# Patient Record
Sex: Female | Born: 1970
Health system: Southern US, Community
[De-identification: ages and names within clinical notes are randomized; demographics above are authoritative.]

## PROBLEM LIST (undated history)

## (undated) DIAGNOSIS — E782 Mixed hyperlipidemia: Secondary | ICD-10-CM

## (undated) DIAGNOSIS — R519 Headache, unspecified: Secondary | ICD-10-CM

## (undated) DIAGNOSIS — F410 Panic disorder [episodic paroxysmal anxiety] without agoraphobia: Secondary | ICD-10-CM

## (undated) DIAGNOSIS — N809 Endometriosis, unspecified: Secondary | ICD-10-CM

## (undated) DIAGNOSIS — K219 Gastro-esophageal reflux disease without esophagitis: Secondary | ICD-10-CM

## (undated) DIAGNOSIS — J45909 Unspecified asthma, uncomplicated: Secondary | ICD-10-CM

## (undated) HISTORY — DX: Unspecified asthma, uncomplicated: J45.909

## (undated) HISTORY — PX: WISDOM TOOTH EXTRACTION: SHX21

## (undated) HISTORY — DX: Panic disorder (episodic paroxysmal anxiety): F41.0

## (undated) HISTORY — DX: Gastro-esophageal reflux disease without esophagitis: K21.9

## (undated) HISTORY — DX: Mixed hyperlipidemia: E78.2

## (undated) HISTORY — DX: Headache, unspecified: R51.9

## (undated) HISTORY — DX: Endometriosis, unspecified: N80.9

## (undated) HISTORY — PX: LAPAROSCOPY: SHX197

---

## 2013-01-22 ENCOUNTER — Ambulatory Visit
Admission: RE | Admit: 2013-01-22 | Discharge: 2013-01-22 | Disposition: A | Discharge status: Home / Self Care | Payer: BC Managed Care – PPO | Source: Ambulatory Visit | Attending: Family Medicine | Admitting: Family Medicine

## 2013-01-22 ENCOUNTER — Other Ambulatory Visit: Payer: Self-pay | Admitting: Family Medicine

## 2013-01-22 DIAGNOSIS — R52 Pain, unspecified: Secondary | ICD-10-CM

## 2014-04-12 IMAGING — CT CT ABD-PELV W/O CM
3 of 4 series · 9 of 46 positions shown, 16 images · non-contrast
Comparison: None.

CLINICAL DATA: Gross hematuria, lower abdominal pain, evaluate for
renal stones

EXAM:
CT ABDOMEN AND PELVIS WITHOUT CONTRAST
TECHNIQUE: Multidetector CT imaging of the abdomen and pelvis was performed
following the standard protocol without intravenous contrast.

[Series 3: lung windows · axial · 0.70mm/px · z∈[-48,+22]mm · 5 of 22 slices shown, 10 images]
[im 4/22  soft-tissue]
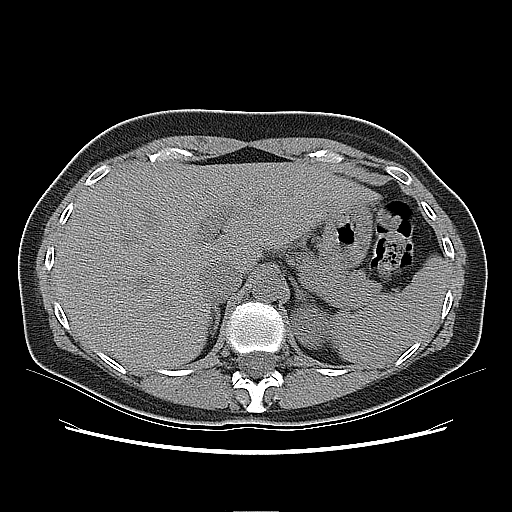
[im 4/22  bone]
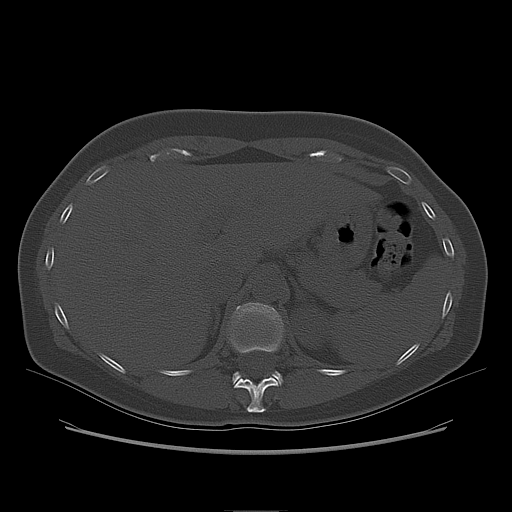
[im 8/22  soft-tissue]
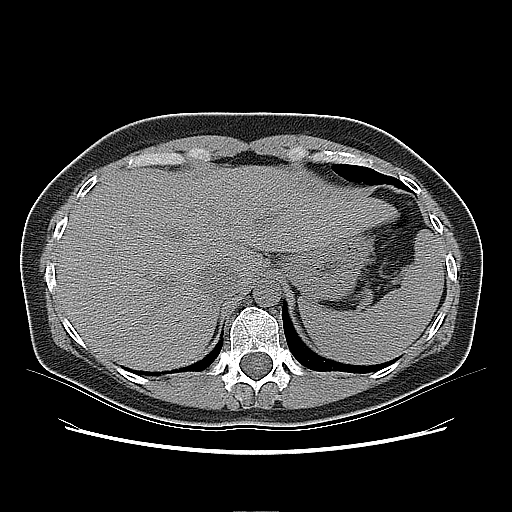
[im 8/22  lung]
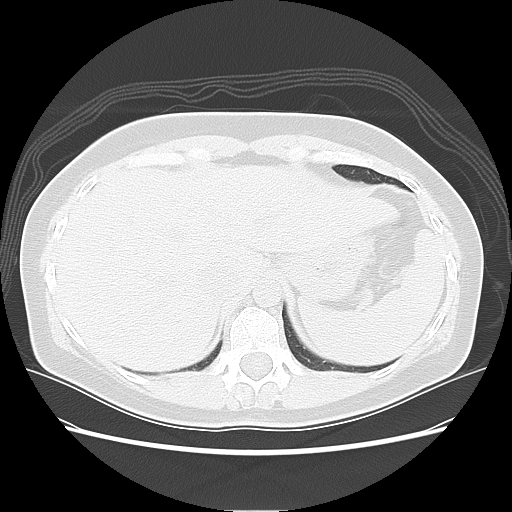
[im 11/22  soft-tissue]
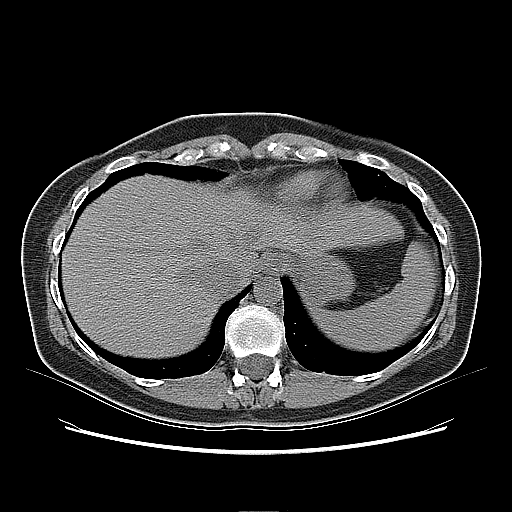
[im 11/22  lung]
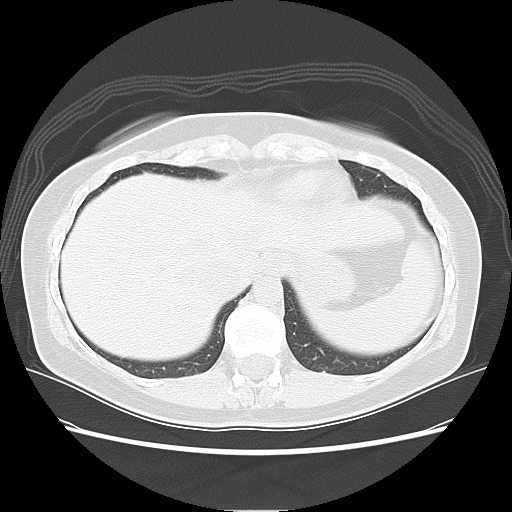
[im 15/22  soft-tissue]
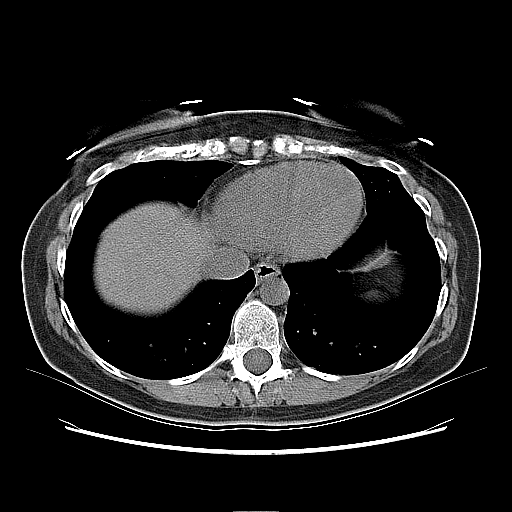
[im 15/22  lung]
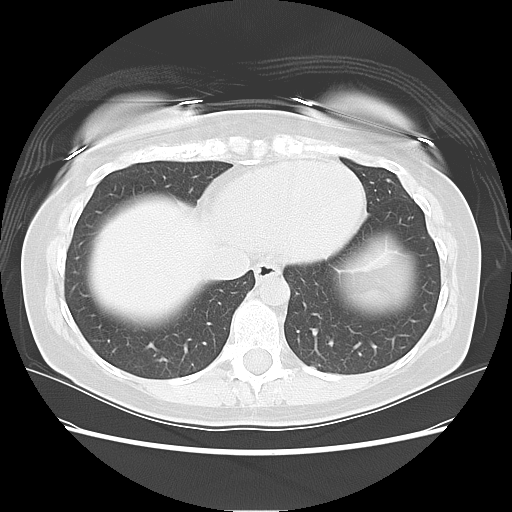
[im 18/22  soft-tissue]
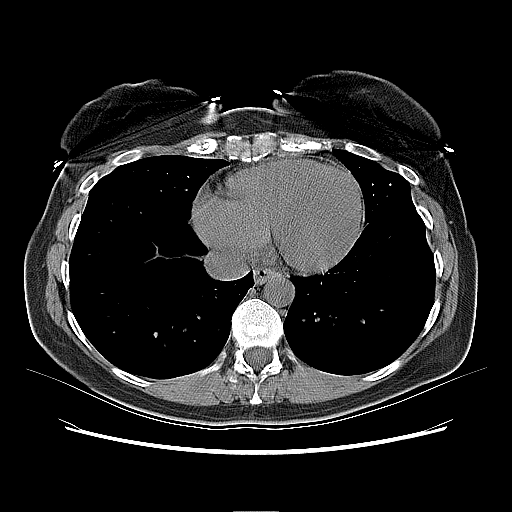
[im 18/22  lung]
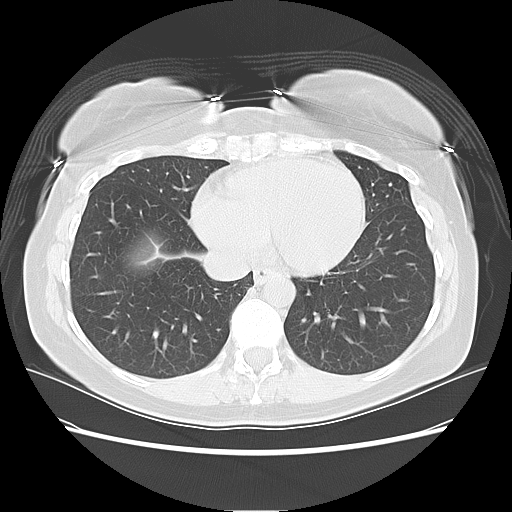

[Series 400: coronal · coronal · 0.93mm/px · 3 of 98 slices shown, 4 images]
[im 33/98  soft-tissue]
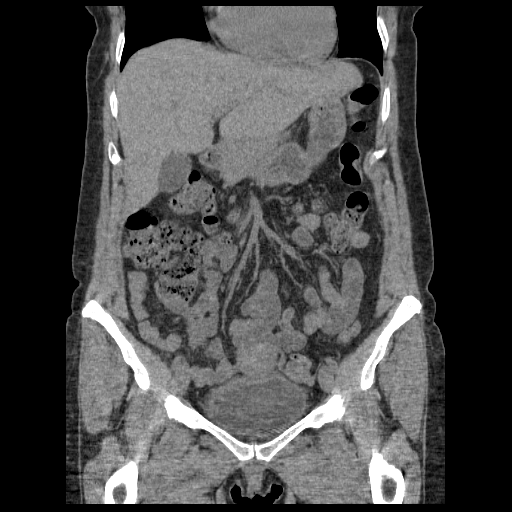
[im 44/98  soft-tissue]
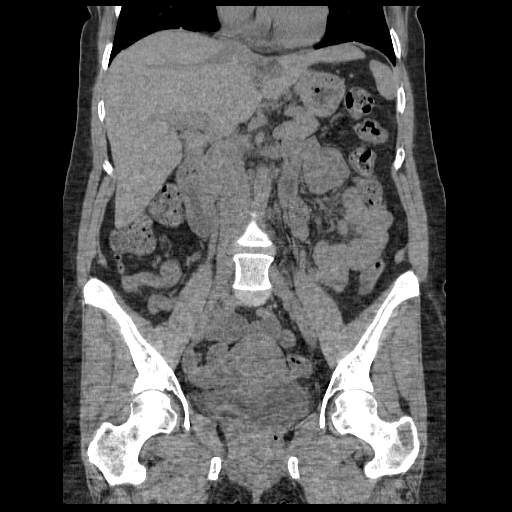
[im 44/98  bone]
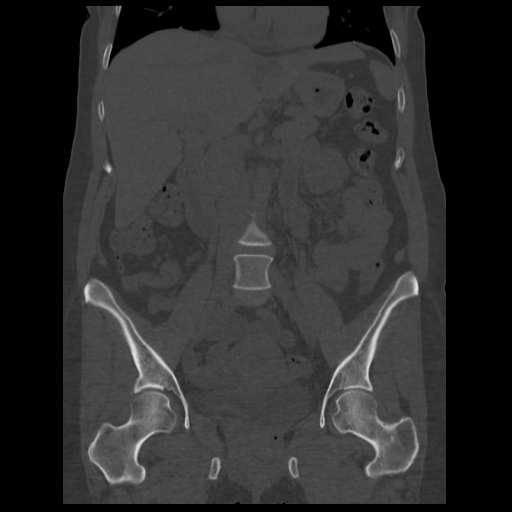
[im 54/98  soft-tissue]
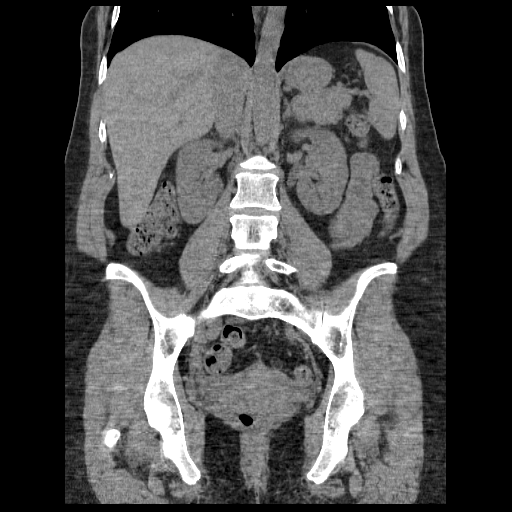

[Series 401: sagittal · sagittal · 0.93mm/px · 1 of 143 slices shown, 2 images]
[im 48/143  soft-tissue]
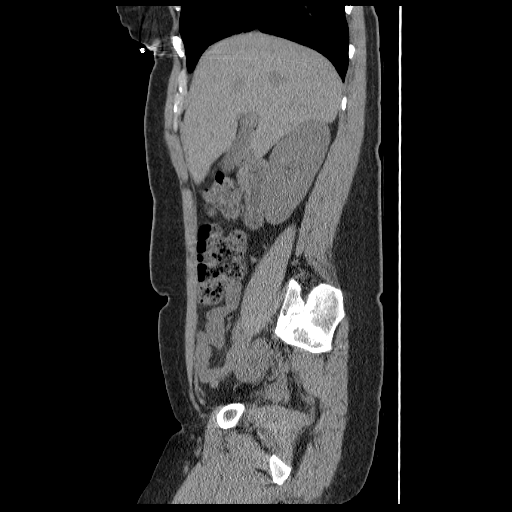
[im 48/143  bone]
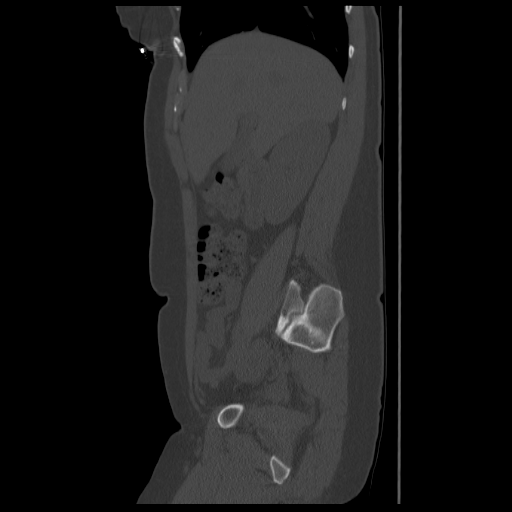

[9 of 46 positions shown; findings below may reference images not displayed]

FINDINGS: Lung bases are clear.

Unenhanced liver, spleen, pancreas, and adrenal glands are within
normal limits.

Gallbladder is unremarkable. No intrahepatic or extrahepatic ductal
dilatation.

Kidneys are within normal limits. No renal calculi or
hydronephrosis.

No evidence of bowel obstruction. Normal appendix.

No evidence of abdominal aortic aneurysm.

No abdominopelvic ascites.

No suspicious abdominopelvic lymphadenopathy.

Uterus and bilateral ovaries are unremarkable.

No ureteral or bladder calculi.

Bladder is notable for anterior wall thickening with perivesical
stranding (series 2/ image 75).

Calcified pelvic phleboliths.

Visualized osseous structures are within normal limits.
IMPRESSION: Bladder wall thickening with perivesical stranding, suspicious for
cystitis.

No renal, ureteral, or bladder calculi. No hydronephrosis.

## 2016-10-22 ENCOUNTER — Ambulatory Visit (INDEPENDENT_AMBULATORY_CARE_PROVIDER_SITE_OTHER): Payer: BLUE CROSS/BLUE SHIELD | Admitting: Internal Medicine

## 2016-10-22 ENCOUNTER — Encounter: Payer: Self-pay | Admitting: Internal Medicine

## 2016-10-22 ENCOUNTER — Other Ambulatory Visit (INDEPENDENT_AMBULATORY_CARE_PROVIDER_SITE_OTHER): Payer: BLUE CROSS/BLUE SHIELD

## 2016-10-22 VITALS — BP 126/78 | HR 77

## 2016-10-22 DIAGNOSIS — J4530 Mild persistent asthma, uncomplicated: Secondary | ICD-10-CM | POA: Diagnosis not present

## 2016-10-22 HISTORY — DX: Mild persistent asthma, uncomplicated: J45.30

## 2016-10-22 LAB — CBC WITH DIFFERENTIAL/PLATELET
BASOS ABS: 0.1 10*3/uL (ref 0.0–0.1)
Basophils Relative: 0.8 % (ref 0.0–3.0)
EOS ABS: 0.3 10*3/uL (ref 0.0–0.7)
Eosinophils Relative: 2.8 % (ref 0.0–5.0)
HCT: 40.6 % (ref 36.0–46.0)
HEMOGLOBIN: 13.6 g/dL (ref 12.0–15.0)
Lymphocytes Relative: 35.5 % (ref 12.0–46.0)
Lymphs Abs: 3.6 10*3/uL (ref 0.7–4.0)
MCHC: 33.6 g/dL (ref 30.0–36.0)
MCV: 86.7 fl (ref 78.0–100.0)
MONO ABS: 0.9 10*3/uL (ref 0.1–1.0)
Monocytes Relative: 8.4 % (ref 3.0–12.0)
Neutro Abs: 5.4 10*3/uL (ref 1.4–7.7)
Neutrophils Relative %: 52.5 % (ref 43.0–77.0)
Platelets: 290 10*3/uL (ref 150.0–400.0)
RBC: 4.69 Mil/uL (ref 3.87–5.11)
RDW: 13.3 % (ref 11.5–15.5)
WBC: 10.2 10*3/uL (ref 4.0–10.5)

## 2016-10-22 MED ORDER — BUDESONIDE-FORMOTEROL FUMARATE 160-4.5 MCG/ACT IN AERO
2.0000 | INHALATION_SPRAY | Freq: Two times a day (BID) | RESPIRATORY_TRACT | 11 refills | Status: DC
Start: 2016-10-22 — End: 2016-11-15

## 2016-10-22 MED ORDER — BUDESONIDE-FORMOTEROL FUMARATE 160-4.5 MCG/ACT IN AERO: 2.0000 | INHALATION_SPRAY | Freq: Two times a day (BID) | RESPIRATORY_TRACT | 0 refills | Status: DC

## 2016-10-22 NOTE — Progress Notes (Signed)
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  lots of outdoor allergies on shots / theophylline never able to stop it or tried on  other maint rx, missed lots of school and saw pulmonary doctor helped a lot age 46 when taught how to use inhaler and not a lot better then  changed from Theoph to singulair after last daughter born 1230 and generally better since then moved to [place] around 2014 had only one bbad episode of flare related to gerd better on rx for gerd and maintained on budesonide bid and reflux meds plus saba avg nightly  And referred to pulmonary clinic 10/22/2016 by Dr  Modesto CharonWong     10/22/2016 1st [facility] office visit/ Sherry Adams   Chief Complaint  Patient presents with  . Pulmonary Consult    Self referral. She states she was dxed with Asthma age 46. She states had illness 2 months ago that made her symptoms flare.  Today she reports she is back at her normal baseline, but wanted to est care for Asthma. She uses proair 2 puffs every night.   back to baseline = avg saba once every  hs along with bud neb bid  Afraid to ex due to "allergies"  / does yoga ok but no indoor aerobics Cough is no  longer an issue/ maint on prilosec 20 mg each pm   No obvious day to day or daytime variability or assoc excess/ purulent sputum or mucus plugs or hemoptysis or cp or chest tightness, subjective wheeze or overt sinus or hb symptoms. No unusual exp hx or h/o childhood pna/ asthma or knowledge of premature birth.  Sleeping ok without nocturnal  or early am exacerbation  of respiratory  c/o's or need for noct saba. Also denies any obvious fluctuation of symptoms with weather or environmental changes or other aggravating or alleviating factors except as outlined above   Current Medications, Allergies, Complete Past Medical History, Past Surgical History, Family  History, and Social History were reviewed in [facility] electronic medical record.  ROS  The following are not active complaints unless bolded sore throat, dysphagia, dental problems, itching, sneezing,  nasal congestion or excess/ purulent secretions, ear ache,   fever, chills, sweats, unintended wt loss, classically pleuritic or exertional cp,  orthopnea pnd or leg swelling, presyncope, palpitations, abdominal pain, anorexia, nausea, vomiting, diarrhea  or change in bowel or bladder habits, change in stools or urine, dysuria,hematuria,  rash, arthralgias, visual complaints, headache, numbness, weakness or ataxia or problems with walking or coordination,  change in mood/affect with tendency to anxiety  or memory.               Review of Systems     Objective:   Physical Exam    amb pleasant wf nad     Vital signs reviewed  - Note on arrival 02 sats  96% on RA      HEENT: nl dentition, turbinates bilaterally, and oropharynx. Nl external ear canals without cough reflex   NECK :  without JVD/Nodes/TM/ nl carotid upstrokes bilaterally   LUNGS: no acc muscle use,  Nl contour chest which is clear to A and P bilaterally without cough on insp or exp maneuvers   CV:  RRR  no s3 or murmur or increase in P2, and no  edema   ABD:  soft and nontender with nl inspiratory excursion in the supine position. No bruits or organomegaly appreciated, bowel sounds nl  MS:  Nl gait/ ext warm without deformities, calf tenderness, cyanosis or clubbing No obvious joint restrictions   SKIN: warm and dry without lesions    NEURO:  alert, approp, nl sensorium with  no motor or cerebellar deficits apparent.    Labs ordered 10/22/2016  Allergy profile      Assessment:

## 2016-10-22 NOTE — Patient Instructions (Addendum)
Prilosec 20 mg  Take 30-60 min before first meal of the day   GERD (REFLUX)  is an extremely common cause of respiratory symptoms just like yours , many times with no obvious heartburn at all.    It can be treated with medication, but also with lifestyle changes including elevation of the head of your bed (ideally with 6 inch  bed blocks),  Smoking cessation, avoidance of late meals, excessive alcohol, and avoid fatty foods, chocolate, peppermint, colas, red wine, and acidic juices such as orange juice.  NO MINT OR MENTHOL PRODUCTS SO NO COUGH DROPS  USE SUGARLESS CANDY INSTEAD (Jolley ranchers or Stover's or Life Savers) or even ice chips will also do - the key is to swallow to prevent all throat clearing. NO OIL BASED VITAMINS - use powdered substitutes.         Plan A = Automatic = Symbicort 160 Take 2 puffs first thing in am and then another 2 puffs about 12 hours later.   Work on inhaler technique:  relax and gently blow all the way out then take a nice smooth deep breath back in, triggering the inhaler at same time you start breathing in.  Hold for up to 5 seconds if you can. Blow out thru nose. Rinse and gargle with water when done   Plan B = Backup Only use your albuterol as a rescue medication to be used if you can't catch your breath by resting or doing a relaxed purse lip breathing pattern.  - The less you use it, the better it will work when you need it. - Ok to use the inhaler up to 2 puffs  every 4 hours if you must but call for appointment if use goes up over your usual need - Don't leave home without it !!  (think of it like the spare tire for your car)   Plan C = Crisis - only use your albuterol nebulizer if you first try Plan B and it fails to help > ok to use the nebulizer up to every 4 hours but if start needing it regularly call for immediate appointment    Please remember to go to the lab department downstairs in the basement  for your tests - we will call you with  the results when they are available.   Please schedule a follow up visit in 3 months but call sooner if needed

## 2016-10-23 LAB — RESPIRATORY ALLERGY PROFILE REGION II ~~LOC~~
ALLERGEN, D PTERNOYSSINUS, D1: 12.7 kU/L — AB
ALLERGEN, OAK, T7: 2.34 kU/L — AB
Allergen, A. alternata, m6: 0.28 kU/L — ABNORMAL HIGH
Allergen, C. Herbarum, M2: <0.1 kU/L
Allergen, Cedar tree, t12: 0.53 kU/L — ABNORMAL HIGH
Allergen, Comm Silver Birch, t9: 3.44 kU/L — ABNORMAL HIGH
Allergen, Cottonwood, t14: <0.1 kU/L
Allergen, Mouse Urine Protein, e78: <0.1 kU/L
Allergen, Mulberry, t76: <0.1 kU/L
Allergen, P. notatum, m1: <0.1 kU/L
Aspergillus fumigatus, m3: <0.1 kU/L
Bermuda Grass: 0.12 kU/L — ABNORMAL HIGH
Box Elder IgE: 0.19 kU/L — ABNORMAL HIGH
CAT DANDER: 0.6 kU/L — AB
Cockroach: 0.11 kU/L — ABNORMAL HIGH
Common Ragweed: <0.1 kU/L
D. FARINAE: 16.9 kU/L — AB
DOG DANDER: 2.28 kU/L — AB
ELM IGE: 0.66 kU/L — AB
IgE (Immunoglobulin E), Serum: 73 kU/L (ref <115)
JOHNSON GRASS: 0.25 kU/L — AB
PECAN/HICKORY TREE IGE: 0.76 kU/L — AB
Rough Pigweed  IgE: <0.1 kU/L
Sheep Sorrel IgE: <0.1 kU/L
TIMOTHY GRASS: 0.48 kU/L — AB

## 2016-10-23 NOTE — Assessment & Plan Note (Addendum)
Allergy profile 10/22/2016 >  Eos 0.3/  IgE pending - Spirometry 10/22/2016  wnl though mild curvature on budesonide neb  s saba prior - 10/22/2016  After extensive coaching HFA effectiveness =    75% > try symb 160 2bid    She is breaking the rule of 2's at baseline and has been for years so not ideally controlled   DDX of  difficult airways management almost all start with A and  include Adherence, Ace Inhibitors, Acid Reflux, Active Sinus Disease, Alpha 1 Antitripsin deficiency, Anxiety masquerading as Airways dz,  ABPA,  Allergy(esp in young), Aspiration (esp in elderly), Adverse effects of meds,  Active smokers, A bunch of PE's (a small clot burden can't cause this syndrome unless there is already severe underlying pulm or vascular dz with poor reserve) plus two Bs  = Bronchiectasis and Beta blocker use..and one C= CHF  In this case Adherence is the biggest issue and starts with  inability to use HFA effectively and also  understand that SABA treats the symptoms but doesn't get to the underlying problem (inflammation).  I used  the analogy of putting steroid cream on a rash to help explain the meaning of topical therapy and the need to get the drug to the target tissue.   - see hfa teaching  ? Acid (or non-acid) GERD > always difficult to exclude as up to 75% of pts in some series report no assoc GI/ Heartburn symptoms> rec max (24h)  acid suppression and diet restrictions/ reviewed and instructions given in writing.   ? Allergy >  Continue singulair , send profile   ? Anxiety > usually at the bottom of this list of usual suspects but should be   higher on this pt's based on Hx   and note already on psychotropics   Reviewed with pt  If your breathing worsens or you need to use your rescue inhaler more than twice weekly or wake up more than twice a month with any respiratory symptoms or require more than two rescue inhalers per year, we need to see you right away because this means we're not  controlling the underlying problem (inflammation) adequately.  Rescue inhalers do not control inflammation and overuse can lead to unnecessary and costly consequences.  They can make you feel better temporarily but eventually they will quit working effectively much as sleep aids lead to more insomnia if used regularly.   Total time devoted to counseling  > 50 % of initial 60 min office visit:  review case with pt/ discussion of options/alternatives/ personally creating written customized instructions  in presence of pt  then going over those specific  Instructions directly with the pt including how to use all of the meds but in particular covering each new medication in detail and the difference between the maintenance= "automatic" meds and the prns using an action plan format for the latter (If this problem/symptom => do that organization reading Left to right).  Please see AVS from this visit for a full list of these instructions which I personally wrote for this pt and  are unique to this visit.   Will bring back in 3 months for consideration to do step down if maintains control on the 160 2bid rx

## 2016-10-23 NOTE — Progress Notes (Signed)
Spoke with pt and notified of results per Dr. Wert. Pt verbalized understanding and denied any questions. 

## 2016-11-15 ENCOUNTER — Telehealth: Payer: Self-pay | Admitting: Internal Medicine

## 2016-11-15 MED ORDER — BUDESONIDE-FORMOTEROL FUMARATE 160-4.5 MCG/ACT IN AERO
2.0000 | INHALATION_SPRAY | Freq: Two times a day (BID) | RESPIRATORY_TRACT | 11 refills | Status: DC
Start: 2016-11-15 — End: 2017-09-17

## 2016-11-15 NOTE — Telephone Encounter (Signed)
Pt states that she lost her Symbicort Rx from 10/22/16 appt with MW.  Requests that this be sent to her pharmacy today.  Rx sent to [facility] [address]. Nothing further needed.

## 2017-01-16 DIAGNOSIS — M9904 Segmental and somatic dysfunction of sacral region: Secondary | ICD-10-CM | POA: Diagnosis not present

## 2017-01-16 DIAGNOSIS — M7541 Impingement syndrome of right shoulder: Secondary | ICD-10-CM | POA: Diagnosis not present

## 2017-01-16 DIAGNOSIS — M9903 Segmental and somatic dysfunction of lumbar region: Secondary | ICD-10-CM | POA: Diagnosis not present

## 2017-01-16 DIAGNOSIS — M722 Plantar fascial fibromatosis: Secondary | ICD-10-CM | POA: Diagnosis not present

## 2017-01-22 ENCOUNTER — Ambulatory Visit (INDEPENDENT_AMBULATORY_CARE_PROVIDER_SITE_OTHER): Payer: BLUE CROSS/BLUE SHIELD | Admitting: Internal Medicine

## 2017-01-22 ENCOUNTER — Encounter: Payer: Self-pay | Admitting: Internal Medicine

## 2017-01-22 ENCOUNTER — Ambulatory Visit: Payer: BLUE CROSS/BLUE SHIELD | Admitting: Internal Medicine

## 2017-01-22 VITALS — BP 110/76 | HR 72 | Ht 69.0 in

## 2017-01-22 DIAGNOSIS — J4530 Mild persistent asthma, uncomplicated: Secondary | ICD-10-CM | POA: Diagnosis not present

## 2017-01-22 NOTE — Progress Notes (Signed)
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  lots of outdoor allergies on shots / theophylline never able to stop it or tried on  other maint rx, missed lots of school and saw pulmonary doctor helped a lot age 46 when taught how to use inhaler and not a lot better then  changed from Theoph to singulair after last daughter born 6930 and generally better since then moved to [place] around 2014 had only one bad episode of flare related to gerd better on rx for gerd and maintained on budesonide bid and reflux meds plus saba avg nightly  And referred to pulmonary clinic 10/22/2016 by Dr  Modesto CharonWong     10/22/2016 1st [facility] office visit/ Sherry Adams   Chief Complaint  Patient presents with  . Pulmonary Consult    Self referral. She states she was dxed with Asthma age 89. She states had illness 2 months ago that made her symptoms flare.  Today she reports she is back at her normal baseline, but wanted to est care for Asthma. She uses proair 2 puffs every night.   back to baseline = avg saba once every  hs along with bud neb bid  Afraid to ex due to "allergies"  / does yoga ok but no indoor aerobics Cough is no  longer an issue/ maint on prilosec 20 mg each pm  rec Prilosec 20 mg  Take 30-60 min before first meal of the day  GERD  Diet   Plan A = Automatic = Symbicort 160 Take 2 puffs first thing in am and then another 2 puffs about 12 hours later.  Work on inhaler technique:  Plan B = Backup Only use your albuterol as a rescue medication  Plan C = Crisis - only use your albuterol nebulizer if you first try Plan B and it fails to help > ok to use the nebulizer up to every 4 hours but if start needing it regularly call for immediate appointment    01/22/2017  f/u ov/Sherry Adams re: mild chronic asthma Chief Complaint  Patient presents with  .  Follow-up    Breathing is doing well. She is using her albuterol inhaler 2 x per wk on average. She rarely uses neb.    still not using symbicort 160 perfectly regularly 2bid or effectively  and   using allegra as maint rx not as prn but overall much better since started symb 160  Has not tried ex yet "afraid of allergies/ wheezing" and wonders if/why still needs saba prior to ex   Noct fine    No obvious day to day or daytime variability or assoc excess/ purulent sputum or mucus plugs or hemoptysis or cp or chest tightness, subjective wheeze or overt sinus or hb symptoms. No unusual exp hx or h/o childhood pna/ asthma or knowledge of premature birth.  Sleeping ok flat without nocturnal  or early am exacerbation  of respiratory  c/o's or need for noct saba. Also denies any obvious fluctuation of symptoms with weather or environmental changes or other aggravating or alleviating factors except as outlined above   Current Allergies, Complete Past Medical History, Past Surgical History, Family History, and Social History were reviewed in [facility] electronic medical record.  ROS  The following are not active complaints  unless bolded Hoarseness, sore throat, dysphagia, dental problems, itching, sneezing,  nasal congestion or discharge of excess mucus or purulent secretions, ear ache,   fever, chills, sweats, unintended wt loss or wt gain, classically pleuritic or exertional cp,  orthopnea pnd or leg swelling, presyncope, palpitations, abdominal pain, anorexia, nausea, vomiting, diarrhea  or change in bowel habits or change in bladder habits, change in stools or change in urine, dysuria, hematuria,  rash, arthralgias, visual complaints, headache, numbness, weakness or ataxia or problems with walking or coordination,  change in mood/affect or memory.        Current Meds  Medication Sig  . albuterol (ACCUNEB) 1.25 MG/3ML nebulizer solution Take 1 ampule by nebulization every 6 (six) hours as needed  for wheezing.  Marland Kitchen albuterol (PROAIR HFA) 108 (90 Base) MCG/ACT inhaler Inhale 1 puff into the lungs every 6 (six) hours as needed for wheezing or shortness of breath.  . budesonide-formoterol (SYMBICORT) 160-4.5 MCG/ACT inhaler Inhale 2 puffs into the lungs 2 (two) times daily.  Marland Kitchen FLUoxetine (PROZAC) 20 MG tablet Take 20 mg by mouth daily.  . montelukast (SINGULAIR) 10 MG tablet Take 10 mg by mouth at bedtime.  Marland Kitchen omeprazole (PRILOSEC) 20 MG capsule Take 20 mg by mouth daily.                     Objective:   Physical Exam    amb pleasant wf nad/ declines wt but says around 180 and no change       Vital signs reviewed  - Note on arrival 02 sats  99% on RA      HEENT: nl dentition, turbinates bilaterally, and oropharynx. Nl external ear canals without cough reflex   NECK :  without JVD/Nodes/TM/ nl carotid upstrokes bilaterally   LUNGS: no acc muscle use,  Nl contour chest which is clear to A and P bilaterally without cough on insp or exp maneuvers   CV:  RRR  no s3 or murmur or increase in P2, and no edema   ABD:  soft and nontender with nl inspiratory excursion in the supine position. No bruits or organomegaly appreciated, bowel sounds nl  MS:  Nl gait/ ext warm without deformities, calf tenderness, cyanosis or clubbing No obvious joint restrictions   SKIN: warm and dry without lesions    NEURO:  alert, approp, nl sensorium with  no motor or cerebellar deficits apparent.         Assessment:

## 2017-01-22 NOTE — Patient Instructions (Signed)
Plan A = Automatic = Symbicort 160 Take 2 puffs first thing in am and then another 2 puffs about 12 hours later.   Work on inhaler technique:  relax and gently blow all the way out then take a nice smooth deep breath back in, triggering the inhaler at same time you start breathing in.  Hold for up to 5 seconds if you can. Blow out thru nose. Rinse and gargle with water when done   Plan B = Backup Only use your albuterol as a rescue medication to be used if you can't catch your breath by resting or doing a relaxed purse lip breathing pattern.  - The less you use it, the better it will work when you need it. - Ok to use the inhaler up to 2 puffs  every 4 hours if you must but call for appointment if use goes up over your usual need - Don't leave home without it !!  (think of it like the spare tire for your car)    Plan C = Crisis - only use your albuterol nebulizer if you first try Plan B and it fails to help > ok to use the nebulizer up to every 4 hours but if start needing it regularly call for immediate appointment   Please schedule a follow up visit in 3 months but call sooner if needed

## 2017-01-22 NOTE — Assessment & Plan Note (Addendum)
Allergy profile 10/22/2016 >  Eos 0.3/  IgE 73  RAST pos dust > dog> cat   Tree> grass, min mold  - Spirometry 10/22/2016  wnl though mild curvature on budesonide neb  s saba prior - 10/22/2016   try symb 160 2bid  - 01/22/2017  After extensive coaching HFA effectiveness =   75%    Despite dogs in bedroom and subtherapeutic symb self administratoin  While on singulair as maint >>>  goals of chronic asthma control met including optimal function and elimination of symptoms with minimal need for rescue therapy. If does use symb effectively/appropriately should note no need for saba, nl ex tol and less need for allegra as well and could consider step down when returns in 3 m   Contingencies discussed in full including contacting this office immediately if not controlling the symptoms using the rule of two's.     I had an extended discussion with the patient reviewing all relevant studies completed to date and  lasting 15 to 20 minutes of a 25 minute visit    Each maintenance medication was reviewed in detail including most importantly the difference between maintenance and prns and under what circumstances the prns are to be triggered using an action plan format that is not reflected in the computer generated alphabetically organized AVS.    Please see AVS for specific instructions unique to this visit that I personally wrote and verbalized to the the pt in detail and then reviewed with pt  by my nurse highlighting any  changes in therapy recommended at today's visit to their plan of care.

## 2017-01-29 DIAGNOSIS — M7541 Impingement syndrome of right shoulder: Secondary | ICD-10-CM | POA: Diagnosis not present

## 2017-01-29 DIAGNOSIS — M9903 Segmental and somatic dysfunction of lumbar region: Secondary | ICD-10-CM | POA: Diagnosis not present

## 2017-01-29 DIAGNOSIS — M722 Plantar fascial fibromatosis: Secondary | ICD-10-CM | POA: Diagnosis not present

## 2017-01-29 DIAGNOSIS — M9904 Segmental and somatic dysfunction of sacral region: Secondary | ICD-10-CM | POA: Diagnosis not present

## 2017-03-10 ENCOUNTER — Other Ambulatory Visit: Payer: Self-pay | Admitting: Obstetrics & Gynecology

## 2017-03-10 DIAGNOSIS — Z124 Encounter for screening for malignant neoplasm of cervix: Secondary | ICD-10-CM | POA: Diagnosis not present

## 2017-03-10 DIAGNOSIS — N920 Excessive and frequent menstruation with regular cycle: Secondary | ICD-10-CM | POA: Diagnosis not present

## 2017-03-10 DIAGNOSIS — Z6825 Body mass index (BMI) 25.0-25.9, adult: Secondary | ICD-10-CM | POA: Diagnosis not present

## 2017-03-10 DIAGNOSIS — Z01419 Encounter for gynecological examination (general) (routine) without abnormal findings: Secondary | ICD-10-CM | POA: Diagnosis not present

## 2017-03-10 DIAGNOSIS — Z3040 Encounter for surveillance of contraceptives, unspecified: Secondary | ICD-10-CM | POA: Diagnosis not present

## 2017-03-12 ENCOUNTER — Other Ambulatory Visit: Payer: Self-pay | Admitting: Obstetrics & Gynecology

## 2017-03-12 DIAGNOSIS — N644 Mastodynia: Secondary | ICD-10-CM

## 2017-03-19 DIAGNOSIS — N644 Mastodynia: Secondary | ICD-10-CM | POA: Diagnosis not present

## 2017-03-19 DIAGNOSIS — Z803 Family history of malignant neoplasm of breast: Secondary | ICD-10-CM | POA: Diagnosis not present

## 2017-03-19 DIAGNOSIS — N6320 Unspecified lump in the left breast, unspecified quadrant: Secondary | ICD-10-CM | POA: Diagnosis not present

## 2017-03-19 DIAGNOSIS — N6310 Unspecified lump in the right breast, unspecified quadrant: Secondary | ICD-10-CM | POA: Diagnosis not present

## 2017-03-20 DIAGNOSIS — Z3009 Encounter for other general counseling and advice on contraception: Secondary | ICD-10-CM | POA: Diagnosis not present

## 2017-04-16 DIAGNOSIS — M722 Plantar fascial fibromatosis: Secondary | ICD-10-CM | POA: Diagnosis not present

## 2017-04-16 DIAGNOSIS — M9904 Segmental and somatic dysfunction of sacral region: Secondary | ICD-10-CM | POA: Diagnosis not present

## 2017-04-16 DIAGNOSIS — M7541 Impingement syndrome of right shoulder: Secondary | ICD-10-CM | POA: Diagnosis not present

## 2017-04-16 DIAGNOSIS — M9903 Segmental and somatic dysfunction of lumbar region: Secondary | ICD-10-CM | POA: Diagnosis not present

## 2017-04-25 DIAGNOSIS — E782 Mixed hyperlipidemia: Secondary | ICD-10-CM | POA: Diagnosis not present

## 2017-04-25 DIAGNOSIS — J45998 Other asthma: Secondary | ICD-10-CM | POA: Diagnosis not present

## 2017-04-25 DIAGNOSIS — Z833 Family history of diabetes mellitus: Secondary | ICD-10-CM | POA: Diagnosis not present

## 2017-04-25 DIAGNOSIS — K219 Gastro-esophageal reflux disease without esophagitis: Secondary | ICD-10-CM | POA: Diagnosis not present

## 2017-04-30 ENCOUNTER — Ambulatory Visit (INDEPENDENT_AMBULATORY_CARE_PROVIDER_SITE_OTHER): Payer: BLUE CROSS/BLUE SHIELD | Admitting: Internal Medicine

## 2017-04-30 ENCOUNTER — Encounter: Payer: Self-pay | Admitting: Internal Medicine

## 2017-04-30 VITALS — BP 122/62 | HR 69 | Ht 70.0 in

## 2017-04-30 DIAGNOSIS — J4530 Mild persistent asthma, uncomplicated: Secondary | ICD-10-CM | POA: Diagnosis not present

## 2017-04-30 NOTE — Patient Instructions (Signed)
No change in medications   Work on perfecting inhaler technique:  relax and gently blow all the way out then take a nice smooth deep breath back in, triggering the inhaler at same time you start breathing in.  Hold for up to 5 seconds if you can. Blow out thru nose. Rinse and gargle with water when done   Please schedule a follow up office visit in 6 months  call sooner if needed

## 2017-04-30 NOTE — Progress Notes (Signed)
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  lots of outdoor allergies on shots / theophylline never able to stop it or tried on  other maint rx, missed lots of school and saw pulmonary doctor helped a lot age 67 when taught how to use inhaler and not a lot better then  changed from Theoph to singulair after last daughter born 53 and generally better since then moved to [place] around 2014 had only one bad episode of flare related to gerd better on rx for gerd and maintained on budesonide bid and reflux meds plus saba avg nightly  And referred to pulmonary clinic 10/22/2016 by Dr  Modesto Charon    History of Present Illness  10/22/2016 1st [facility] office visit/ Sherry Adams   Chief Complaint  Patient presents with  . Pulmonary Consult    Self referral. She states she was dxed with Asthma age 89. She states had illness 2 months ago that made her symptoms flare.  Today she reports she is back at her normal baseline, but wanted to est care for Asthma. She uses proair 2 puffs every night.   back to baseline = avg saba once every  hs along with bud neb bid  Afraid to ex due to "allergies"  / does yoga ok but no indoor aerobics Cough is no  longer an issue/ maint on prilosec 20 mg each pm  rec Prilosec 20 mg  Take 30-60 min before first meal of the day  GERD  Diet   Plan A = Automatic = Symbicort 160 Take 2 puffs first thing in am and then another 2 puffs about 12 hours later.  Work on inhaler technique:  Plan B = Backup Only use your albuterol as a rescue medication  Plan C = Crisis - only use your albuterol nebulizer if you first try Plan B and it fails to help > ok to use the nebulizer up to every 4 hours but if start needing it regularly call for immediate appointment    01/22/2017  f/u ov/Shannen Vernon re: mild chronic asthma Chief Complaint   Patient presents with  . Follow-up    Breathing is doing well. She is using her albuterol inhaler 2 x per wk on average. She rarely uses neb.    still not using symbicort 160 perfectly regularly 2bid or effectively  and   using allegra as maint rx not as prn but overall much better since started symb 160  Has not tried ex yet "afraid of allergies/ wheezing" and wonders if/why still needs saba prior to ex   Noct fine rec Plan A = Automatic = Symbicort 160 Take 2 puffs first thing in am and then another 2 puffs about 12 hours later.  Work on inhaler technique:  Plan B = Backup Only use your albuterol  Plan C = Crisis - only use your albuterol nebulizer if you first try Plan B         04/30/2017  f/u ov/Sherry Adams re: mild chronic asthma best she's done in a long time  Chief Complaint  Patient presents with  . Follow-up    asthma follow up, feels that symbicort really has made a good difference in asthma treatment  Dyspnea:  Starting to work out  Cough: no Sleep: ok at 30  degrees When stop allegra gets nasal congestion / no need for saba   No obvious day to day or daytime variability or assoc excess/ purulent sputum or mucus plugs or hemoptysis or cp or chest tightness, subjective wheeze or overt sinus or hb symptoms. No unusual exposure hx or h/o childhood pna or knowledge of premature birth.  Sleeping ok at 30 degrees  without nocturnal  or early am exacerbation  of respiratory  c/o's or need for noct saba. Also denies any obvious fluctuation of symptoms with weather or environmental changes or other aggravating or alleviating factors except as outlined above   Current Allergies, Complete Past Medical History, Past Surgical History, Family History, and Social History were reviewed in [facility] electronic medical record.  ROS  The following are not active complaints unless bolded Hoarseness, sore throat, dysphagia, dental problems, itching, sneezing,  nasal congestion or discharge of  excess mucus or purulent secretions, ear ache,   fever, chills, sweats, unintended wt loss or wt gain, classically pleuritic or exertional cp,  orthopnea pnd or leg swelling, presyncope, palpitations, abdominal pain, anorexia, nausea, vomiting, diarrhea  or change in bowel habits or change in bladder habits, change in stools or change in urine, dysuria, hematuria,  rash, arthralgias, visual complaints, headache, numbness, weakness or ataxia or problems with walking or coordination,  change in mood/affect or memory.        Current Meds  Medication Sig  . albuterol (ACCUNEB) 1.25 MG/3ML nebulizer solution Take 1 ampule by nebulization every 6 (six) hours as needed for wheezing.  Sherry Adams. albuterol (PROAIR HFA) 108 (90 Base) MCG/ACT inhaler Inhale 1 puff into the lungs every 6 (six) hours as needed for wheezing or shortness of breath.  . budesonide-formoterol (SYMBICORT) 160-4.5 MCG/ACT inhaler Inhale 2 puffs into the lungs 2 (two) times daily.  Sherry Adams. FLUoxetine (PROZAC) 20 MG tablet Take 20 mg by mouth daily.  . montelukast (SINGULAIR) 10 MG tablet Take 10 mg by mouth at bedtime.  Sherry Adams. omeprazole (PRILOSEC) 20 MG capsule Take 20 mg by mouth daily.                               Objective:   Physical Exam    amb wf nad/ declines wt   Vital signs reviewed - Note on arrival 02 sats  97% on RA     HEENT: nl dentition, turbinates bilaterally, and oropharynx. Nl external ear canals without cough reflex   NECK :  without JVD/Nodes/TM/ nl carotid upstrokes bilaterally   LUNGS: no acc muscle use,  Nl contour chest which is clear to A and P bilaterally without cough on insp or exp maneuvers   CV:  RRR  no s3 or murmur or increase in P2, and no edema   ABD:  soft and nontender with nl inspiratory excursion in the supine position. No bruits or organomegaly appreciated, bowel sounds nl  MS:  Nl gait/ ext warm without deformities, calf tenderness, cyanosis or clubbing No obvious joint  restrictions   SKIN: warm and dry without lesions    NEURO:  alert, approp, nl sensorium with  no motor or cerebellar deficits apparent.            Assessment:

## 2017-05-01 ENCOUNTER — Encounter: Payer: Self-pay | Admitting: Internal Medicine

## 2017-05-01 NOTE — Assessment & Plan Note (Signed)
Allergy profile 10/22/2016 >  Eos 0.3/  IgE 73  RAST pos dust > dog> cat   Tree> grass, min mold  - Spirometry 10/22/2016  wnl though mild curvature on budesonide neb  s saba prior - 10/22/2016   try symb 160 2bid - 04/30/2017  After extensive coaching HFA effectiveness =   75%     Despite suboptimal hfa > All goals of chronic asthma control met including optimal function and elimination of symptoms with minimal need for rescue therapy.  Contingencies discussed in full including contacting this office immediately if not controlling the symptoms using the rule of two's.     F/u q 6 m approp

## 2017-05-20 DIAGNOSIS — L243 Irritant contact dermatitis due to cosmetics: Secondary | ICD-10-CM | POA: Diagnosis not present

## 2017-06-04 DIAGNOSIS — M722 Plantar fascial fibromatosis: Secondary | ICD-10-CM | POA: Diagnosis not present

## 2017-06-04 DIAGNOSIS — M9904 Segmental and somatic dysfunction of sacral region: Secondary | ICD-10-CM | POA: Diagnosis not present

## 2017-06-04 DIAGNOSIS — M7541 Impingement syndrome of right shoulder: Secondary | ICD-10-CM | POA: Diagnosis not present

## 2017-06-04 DIAGNOSIS — M9903 Segmental and somatic dysfunction of lumbar region: Secondary | ICD-10-CM | POA: Diagnosis not present

## 2017-06-30 DIAGNOSIS — F4322 Adjustment disorder with anxiety: Secondary | ICD-10-CM | POA: Diagnosis not present

## 2017-06-30 DIAGNOSIS — F341 Dysthymic disorder: Secondary | ICD-10-CM | POA: Diagnosis not present

## 2017-07-04 DIAGNOSIS — D2271 Melanocytic nevi of right lower limb, including hip: Secondary | ICD-10-CM | POA: Diagnosis not present

## 2017-07-04 DIAGNOSIS — D225 Melanocytic nevi of trunk: Secondary | ICD-10-CM | POA: Diagnosis not present

## 2017-07-04 DIAGNOSIS — L570 Actinic keratosis: Secondary | ICD-10-CM | POA: Diagnosis not present

## 2017-07-04 DIAGNOSIS — L918 Other hypertrophic disorders of the skin: Secondary | ICD-10-CM | POA: Diagnosis not present

## 2017-07-04 DIAGNOSIS — D2262 Melanocytic nevi of left upper limb, including shoulder: Secondary | ICD-10-CM | POA: Diagnosis not present

## 2017-07-09 DIAGNOSIS — F4322 Adjustment disorder with anxiety: Secondary | ICD-10-CM | POA: Diagnosis not present

## 2017-07-09 DIAGNOSIS — F341 Dysthymic disorder: Secondary | ICD-10-CM | POA: Diagnosis not present

## 2017-07-30 DIAGNOSIS — F341 Dysthymic disorder: Secondary | ICD-10-CM | POA: Diagnosis not present

## 2017-07-30 DIAGNOSIS — F4322 Adjustment disorder with anxiety: Secondary | ICD-10-CM | POA: Diagnosis not present

## 2017-08-13 DIAGNOSIS — F341 Dysthymic disorder: Secondary | ICD-10-CM | POA: Diagnosis not present

## 2017-08-13 DIAGNOSIS — F4322 Adjustment disorder with anxiety: Secondary | ICD-10-CM | POA: Diagnosis not present

## 2017-08-20 DIAGNOSIS — F341 Dysthymic disorder: Secondary | ICD-10-CM | POA: Diagnosis not present

## 2017-08-20 DIAGNOSIS — F4322 Adjustment disorder with anxiety: Secondary | ICD-10-CM | POA: Diagnosis not present

## 2017-09-03 DIAGNOSIS — F4322 Adjustment disorder with anxiety: Secondary | ICD-10-CM | POA: Diagnosis not present

## 2017-09-03 DIAGNOSIS — F341 Dysthymic disorder: Secondary | ICD-10-CM | POA: Diagnosis not present

## 2017-09-17 ENCOUNTER — Other Ambulatory Visit: Payer: Self-pay | Admitting: Internal Medicine

## 2017-10-01 DIAGNOSIS — F4322 Adjustment disorder with anxiety: Secondary | ICD-10-CM | POA: Diagnosis not present

## 2017-10-01 DIAGNOSIS — F341 Dysthymic disorder: Secondary | ICD-10-CM | POA: Diagnosis not present

## 2017-10-17 DIAGNOSIS — M9901 Segmental and somatic dysfunction of cervical region: Secondary | ICD-10-CM | POA: Diagnosis not present

## 2017-10-17 DIAGNOSIS — M7542 Impingement syndrome of left shoulder: Secondary | ICD-10-CM | POA: Diagnosis not present

## 2017-10-17 DIAGNOSIS — M9905 Segmental and somatic dysfunction of pelvic region: Secondary | ICD-10-CM | POA: Diagnosis not present

## 2017-10-17 DIAGNOSIS — M9903 Segmental and somatic dysfunction of lumbar region: Secondary | ICD-10-CM | POA: Diagnosis not present

## 2017-10-22 DIAGNOSIS — F4322 Adjustment disorder with anxiety: Secondary | ICD-10-CM | POA: Diagnosis not present

## 2017-10-22 DIAGNOSIS — F341 Dysthymic disorder: Secondary | ICD-10-CM | POA: Diagnosis not present

## 2017-10-24 ENCOUNTER — Ambulatory Visit
Admission: RE | Admit: 2017-10-24 | Discharge: 2017-10-24 | Disposition: A | Discharge status: Home / Self Care | Payer: BLUE CROSS/BLUE SHIELD | Source: Ambulatory Visit | Attending: Chiropractic Medicine | Admitting: Chiropractic Medicine

## 2017-10-24 ENCOUNTER — Other Ambulatory Visit: Payer: Self-pay | Admitting: Chiropractic Medicine

## 2017-10-24 DIAGNOSIS — M25612 Stiffness of left shoulder, not elsewhere classified: Secondary | ICD-10-CM

## 2017-10-24 DIAGNOSIS — Z833 Family history of diabetes mellitus: Secondary | ICD-10-CM | POA: Diagnosis not present

## 2017-10-24 DIAGNOSIS — K219 Gastro-esophageal reflux disease without esophagitis: Secondary | ICD-10-CM | POA: Diagnosis not present

## 2017-10-24 DIAGNOSIS — M25512 Pain in left shoulder: Secondary | ICD-10-CM | POA: Diagnosis not present

## 2017-10-24 DIAGNOSIS — E782 Mixed hyperlipidemia: Secondary | ICD-10-CM | POA: Diagnosis not present

## 2017-10-24 DIAGNOSIS — J45998 Other asthma: Secondary | ICD-10-CM | POA: Diagnosis not present

## 2017-10-29 ENCOUNTER — Ambulatory Visit: Payer: BLUE CROSS/BLUE SHIELD | Admitting: Internal Medicine

## 2017-10-29 DIAGNOSIS — F4322 Adjustment disorder with anxiety: Secondary | ICD-10-CM | POA: Diagnosis not present

## 2017-10-29 DIAGNOSIS — M7542 Impingement syndrome of left shoulder: Secondary | ICD-10-CM | POA: Diagnosis not present

## 2017-10-29 DIAGNOSIS — M9905 Segmental and somatic dysfunction of pelvic region: Secondary | ICD-10-CM | POA: Diagnosis not present

## 2017-10-29 DIAGNOSIS — F341 Dysthymic disorder: Secondary | ICD-10-CM | POA: Diagnosis not present

## 2017-10-29 DIAGNOSIS — M9901 Segmental and somatic dysfunction of cervical region: Secondary | ICD-10-CM | POA: Diagnosis not present

## 2017-10-29 DIAGNOSIS — M9903 Segmental and somatic dysfunction of lumbar region: Secondary | ICD-10-CM | POA: Diagnosis not present

## 2017-11-05 DIAGNOSIS — M9901 Segmental and somatic dysfunction of cervical region: Secondary | ICD-10-CM | POA: Diagnosis not present

## 2017-11-05 DIAGNOSIS — M9905 Segmental and somatic dysfunction of pelvic region: Secondary | ICD-10-CM | POA: Diagnosis not present

## 2017-11-05 DIAGNOSIS — M9903 Segmental and somatic dysfunction of lumbar region: Secondary | ICD-10-CM | POA: Diagnosis not present

## 2017-11-05 DIAGNOSIS — M7542 Impingement syndrome of left shoulder: Secondary | ICD-10-CM | POA: Diagnosis not present

## 2017-11-11 DIAGNOSIS — T148XXA Other injury of unspecified body region, initial encounter: Secondary | ICD-10-CM | POA: Diagnosis not present

## 2017-11-11 DIAGNOSIS — M25512 Pain in left shoulder: Secondary | ICD-10-CM | POA: Diagnosis not present

## 2017-11-11 DIAGNOSIS — M67912 Unspecified disorder of synovium and tendon, left shoulder: Secondary | ICD-10-CM | POA: Diagnosis not present

## 2017-11-14 DIAGNOSIS — M7542 Impingement syndrome of left shoulder: Secondary | ICD-10-CM | POA: Diagnosis not present

## 2017-11-14 DIAGNOSIS — M9903 Segmental and somatic dysfunction of lumbar region: Secondary | ICD-10-CM | POA: Diagnosis not present

## 2017-11-14 DIAGNOSIS — M9905 Segmental and somatic dysfunction of pelvic region: Secondary | ICD-10-CM | POA: Diagnosis not present

## 2017-11-14 DIAGNOSIS — M9901 Segmental and somatic dysfunction of cervical region: Secondary | ICD-10-CM | POA: Diagnosis not present

## 2017-11-18 ENCOUNTER — Encounter: Payer: Self-pay | Admitting: Internal Medicine

## 2017-11-18 ENCOUNTER — Ambulatory Visit (INDEPENDENT_AMBULATORY_CARE_PROVIDER_SITE_OTHER): Payer: BLUE CROSS/BLUE SHIELD | Admitting: Internal Medicine

## 2017-11-18 VITALS — BP 118/72 | HR 68 | Ht 69.25 in

## 2017-11-18 DIAGNOSIS — J4530 Mild persistent asthma, uncomplicated: Secondary | ICD-10-CM

## 2017-11-18 NOTE — Patient Instructions (Signed)
No change in medications    Please schedule a follow up visit in 6  months but call sooner if needed  

## 2017-11-18 NOTE — Progress Notes (Signed)
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  lots of outdoor allergies on shots / theophylline never able to stop it or tried on  other maint rx, missed lots of school and saw pulmonary doctor helped a lot age 47 when taught how to use inhaler and not a lot better then  changed from Theoph to singulair after last daughter born 53 and generally better since then moved to [place] around 2014 had only one bad episode of flare related to gerd better on rx for gerd and maintained on budesonide bid and reflux meds plus saba avg nightly  And referred to pulmonary clinic 10/22/2016 by Dr  Modesto Charon    History of Present Illness  10/22/2016 1st [facility] office visit/ Wert   Chief Complaint  Patient presents with  . Pulmonary Consult    Self referral. She states she was dxed with Asthma age 47. She states had illness 2 months ago that made her symptoms flare.  Today she reports she is back at her normal baseline, but wanted to est care for Asthma. She uses proair 2 puffs every night.   back to baseline = avg saba once every  hs along with bud neb bid  Afraid to ex due to "allergies"  / does yoga ok but no indoor aerobics Cough is no  longer an issue/ maint on prilosec 20 mg each pm  rec Prilosec 20 mg  Take 30-60 min before first meal of the day  GERD  Diet   Plan A = Automatic = Symbicort 160 Take 2 puffs first thing in am and then another 2 puffs about 12 hours later.  Work on inhaler technique:  Plan B = Backup Only use your albuterol as a rescue medication  Plan C = Crisis - only use your albuterol nebulizer if you first try Plan B and it fails to help > ok to use the nebulizer up to every 4 hours but if start needing it regularly call for immediate appointment    01/22/2017  f/u ov/Wert re: mild chronic asthma Chief Complaint   Patient presents with  . Follow-up    Breathing is doing well. She is using her albuterol inhaler 2 x per wk on average. She rarely uses neb.    still not using symbicort 160 perfectly regularly 2bid or effectively  and   using allegra as maint rx not as prn but overall much better since started symb 160  Has not tried ex yet "afraid of allergies/ wheezing" and wonders if/why still needs saba prior to ex   Noct fine rec Plan A = Automatic = Symbicort 160 Take 2 puffs first thing in am and then another 2 puffs about 12 hours later.  Work on inhaler technique:  Plan B = Backup Only use your albuterol  Plan C = Crisis - only use your albuterol nebulizer if you first try Plan B         04/30/2017  f/u ov/Wert re: mild chronic asthma best she's done in a long time  Chief Complaint  Patient presents with  . Follow-up    asthma follow up, feels that symbicort really has made a good difference in asthma treatment  Dyspnea:  Starting to work out  Cough: no Sleep: ok at 30  degrees When stop allegra gets nasal congestion / no need for saba  rec No change in medications  Work on perfecting inhaler technique:  Please schedule a follow up office visit in 6 months  call sooner if needed     11/18/2017  f/u ov/Wert re: mild chronic asthma - worse on symb 160 one twice daily so back to 2bid Chief Complaint  Patient presents with  . Follow-up    Breathing is doing well. She is using her albuterol on average once per month.    Dyspnea:  Ex bike up an hour qod s problems Cough: no Sleeping: 30 degrees due to gerd SABA use: once a month  02: none     No obvious day to day or daytime variability or assoc excess/ purulent sputum or mucus plugs or hemoptysis or cp or chest tightness, subjective wheeze or overt sinus  symptoms.   Sleeping as above  without nocturnal  or early am exacerbation  of respiratory  c/o's or need for noct saba. Also denies any obvious fluctuation of symptoms with  weather or environmental changes or other aggravating or alleviating factors except as outlined above   No unusual exposure hx or h/o childhood pna/ asthma or knowledge of premature birth.  Current Allergies, Complete Past Medical History, Past Surgical History, Family History, and Social History were reviewed in [facility] electronic medical record.  ROS  The following are not active complaints unless bolded Hoarseness, sore throat, dysphagia, dental problems, itching, sneezing,  nasal congestion or discharge of excess mucus or purulent secretions, ear ache,   fever, chills, sweats, unintended wt loss or wt gain, classically pleuritic or exertional cp,  orthopnea pnd or arm/hand swelling  or leg swelling, presyncope, palpitations, abdominal pain, anorexia, nausea, vomiting, diarrhea  or change in bowel habits or change in bladder habits, change in stools or change in urine, dysuria, hematuria,  rash, arthralgias, visual complaints, headache, numbness, weakness or ataxia or problems with walking or coordination,  change in mood or  memory.        Current Meds  Medication Sig  . albuterol (ACCUNEB) 1.25 MG/3ML nebulizer solution Take 1 ampule by nebulization every 6 (six) hours as needed for wheezing.  Marland Kitchen. albuterol (PROAIR HFA) 108 (90 Base) MCG/ACT inhaler Inhale 1 puff into the lungs every 6 (six) hours as needed for wheezing or shortness of breath.  Marland Kitchen. FLUoxetine (PROZAC) 20 MG tablet Take 20 mg by mouth daily.  . montelukast (SINGULAIR) 10 MG tablet Take 10 mg by mouth at bedtime.  Marland Kitchen. omeprazole (PRILOSEC) 20 MG capsule Take 20 mg by mouth daily.  . SYMBICORT 160-4.5 MCG/ACT inhaler TAKE 2 PUFFS BY MOUTH TWICE A DAY                   Objective:   Physical Exam   amb wf / declined wt    Vital signs reviewed - Note on arrival 02 sats  100% on RA     HEENT: nl dentition, turbinates bilaterally, and oropharynx. Nl external ear canals without cough reflex   NECK :  without  JVD/Nodes/TM/ nl carotid upstrokes bilaterally   LUNGS: no acc muscle use,  Nl contour chest which is clear to A and P bilaterally without cough on insp or exp maneuvers   CV:  RRR  no s3 or murmur or increase in P2, and no edema   ABD:  soft and nontender with nl inspiratory excursion in the supine position. No bruits or organomegaly  appreciated, bowel sounds nl  MS:  Nl gait/ ext warm without deformities, calf tenderness, cyanosis or clubbing No obvious joint restrictions   SKIN: warm and dry without lesions    NEURO:  alert, approp, nl sensorium with  no motor or cerebellar deficits apparent.            Assessment:

## 2017-11-18 NOTE — Assessment & Plan Note (Signed)
Allergy profile 10/22/2016 >  Eos 0.3/  IgE 73  RAST pos dust > dog> cat   Tree> grass, min mold  - Spirometry 10/22/2016  wnl though mild curvature on budesonide neb  s saba prior - 10/22/2016   try symb 160 2bid - 04/30/2017  After extensive coaching HFA effectiveness =   75%     All goals of chronic asthma control met including optimal function and elimination of symptoms with minimal need for rescue therapy on symb 160 2bid and singulair so no need to change rx   Contingencies discussed in full including contacting this office immediately if not controlling the symptoms using the rule of two's.     F/u in 6 months.

## 2017-11-19 ENCOUNTER — Ambulatory Visit: Payer: BLUE CROSS/BLUE SHIELD | Admitting: Internal Medicine

## 2017-11-19 DIAGNOSIS — M25512 Pain in left shoulder: Secondary | ICD-10-CM | POA: Diagnosis not present

## 2017-11-19 DIAGNOSIS — F341 Dysthymic disorder: Secondary | ICD-10-CM | POA: Diagnosis not present

## 2017-11-19 DIAGNOSIS — F4322 Adjustment disorder with anxiety: Secondary | ICD-10-CM | POA: Diagnosis not present

## 2017-11-19 DIAGNOSIS — M7542 Impingement syndrome of left shoulder: Secondary | ICD-10-CM | POA: Diagnosis not present

## 2017-11-21 DIAGNOSIS — M7542 Impingement syndrome of left shoulder: Secondary | ICD-10-CM | POA: Diagnosis not present

## 2017-11-25 DIAGNOSIS — M7542 Impingement syndrome of left shoulder: Secondary | ICD-10-CM | POA: Diagnosis not present

## 2017-11-28 DIAGNOSIS — M7542 Impingement syndrome of left shoulder: Secondary | ICD-10-CM | POA: Diagnosis not present

## 2017-12-02 DIAGNOSIS — M7542 Impingement syndrome of left shoulder: Secondary | ICD-10-CM | POA: Diagnosis not present

## 2017-12-03 ENCOUNTER — Ambulatory Visit: Payer: BLUE CROSS/BLUE SHIELD

## 2017-12-03 DIAGNOSIS — M9903 Segmental and somatic dysfunction of lumbar region: Secondary | ICD-10-CM | POA: Diagnosis not present

## 2017-12-03 DIAGNOSIS — M9905 Segmental and somatic dysfunction of pelvic region: Secondary | ICD-10-CM | POA: Diagnosis not present

## 2017-12-03 DIAGNOSIS — F4322 Adjustment disorder with anxiety: Secondary | ICD-10-CM | POA: Diagnosis not present

## 2017-12-03 DIAGNOSIS — F341 Dysthymic disorder: Secondary | ICD-10-CM | POA: Diagnosis not present

## 2017-12-03 DIAGNOSIS — M9901 Segmental and somatic dysfunction of cervical region: Secondary | ICD-10-CM | POA: Diagnosis not present

## 2017-12-03 DIAGNOSIS — M7542 Impingement syndrome of left shoulder: Secondary | ICD-10-CM | POA: Diagnosis not present

## 2017-12-05 DIAGNOSIS — M7542 Impingement syndrome of left shoulder: Secondary | ICD-10-CM | POA: Diagnosis not present

## 2017-12-08 DIAGNOSIS — M9903 Segmental and somatic dysfunction of lumbar region: Secondary | ICD-10-CM | POA: Diagnosis not present

## 2017-12-08 DIAGNOSIS — M9905 Segmental and somatic dysfunction of pelvic region: Secondary | ICD-10-CM | POA: Diagnosis not present

## 2017-12-08 DIAGNOSIS — M7542 Impingement syndrome of left shoulder: Secondary | ICD-10-CM | POA: Diagnosis not present

## 2017-12-08 DIAGNOSIS — M9901 Segmental and somatic dysfunction of cervical region: Secondary | ICD-10-CM | POA: Diagnosis not present

## 2017-12-12 DIAGNOSIS — M7542 Impingement syndrome of left shoulder: Secondary | ICD-10-CM | POA: Diagnosis not present

## 2017-12-15 DIAGNOSIS — M7542 Impingement syndrome of left shoulder: Secondary | ICD-10-CM | POA: Diagnosis not present

## 2017-12-17 DIAGNOSIS — F4322 Adjustment disorder with anxiety: Secondary | ICD-10-CM | POA: Diagnosis not present

## 2017-12-17 DIAGNOSIS — F341 Dysthymic disorder: Secondary | ICD-10-CM | POA: Diagnosis not present

## 2017-12-30 DIAGNOSIS — M7542 Impingement syndrome of left shoulder: Secondary | ICD-10-CM | POA: Diagnosis not present

## 2017-12-31 DIAGNOSIS — F4322 Adjustment disorder with anxiety: Secondary | ICD-10-CM | POA: Diagnosis not present

## 2017-12-31 DIAGNOSIS — M7542 Impingement syndrome of left shoulder: Secondary | ICD-10-CM | POA: Diagnosis not present

## 2017-12-31 DIAGNOSIS — F341 Dysthymic disorder: Secondary | ICD-10-CM | POA: Diagnosis not present

## 2018-01-01 DIAGNOSIS — M9903 Segmental and somatic dysfunction of lumbar region: Secondary | ICD-10-CM | POA: Diagnosis not present

## 2018-01-01 DIAGNOSIS — M9901 Segmental and somatic dysfunction of cervical region: Secondary | ICD-10-CM | POA: Diagnosis not present

## 2018-01-01 DIAGNOSIS — M722 Plantar fascial fibromatosis: Secondary | ICD-10-CM | POA: Diagnosis not present

## 2018-01-01 DIAGNOSIS — M7542 Impingement syndrome of left shoulder: Secondary | ICD-10-CM | POA: Diagnosis not present

## 2018-01-02 DIAGNOSIS — M7542 Impingement syndrome of left shoulder: Secondary | ICD-10-CM | POA: Diagnosis not present

## 2018-01-06 DIAGNOSIS — M7542 Impingement syndrome of left shoulder: Secondary | ICD-10-CM | POA: Diagnosis not present

## 2018-01-07 DIAGNOSIS — M9901 Segmental and somatic dysfunction of cervical region: Secondary | ICD-10-CM | POA: Diagnosis not present

## 2018-01-07 DIAGNOSIS — M722 Plantar fascial fibromatosis: Secondary | ICD-10-CM | POA: Diagnosis not present

## 2018-01-07 DIAGNOSIS — M7542 Impingement syndrome of left shoulder: Secondary | ICD-10-CM | POA: Diagnosis not present

## 2018-01-07 DIAGNOSIS — M9903 Segmental and somatic dysfunction of lumbar region: Secondary | ICD-10-CM | POA: Diagnosis not present

## 2018-01-09 DIAGNOSIS — M7542 Impingement syndrome of left shoulder: Secondary | ICD-10-CM | POA: Diagnosis not present

## 2018-01-13 DIAGNOSIS — M7542 Impingement syndrome of left shoulder: Secondary | ICD-10-CM | POA: Diagnosis not present

## 2018-01-14 DIAGNOSIS — M9901 Segmental and somatic dysfunction of cervical region: Secondary | ICD-10-CM | POA: Diagnosis not present

## 2018-01-14 DIAGNOSIS — M9903 Segmental and somatic dysfunction of lumbar region: Secondary | ICD-10-CM | POA: Diagnosis not present

## 2018-01-14 DIAGNOSIS — F4322 Adjustment disorder with anxiety: Secondary | ICD-10-CM | POA: Diagnosis not present

## 2018-01-14 DIAGNOSIS — M722 Plantar fascial fibromatosis: Secondary | ICD-10-CM | POA: Diagnosis not present

## 2018-01-14 DIAGNOSIS — F341 Dysthymic disorder: Secondary | ICD-10-CM | POA: Diagnosis not present

## 2018-01-14 DIAGNOSIS — M7542 Impingement syndrome of left shoulder: Secondary | ICD-10-CM | POA: Diagnosis not present

## 2018-01-21 DIAGNOSIS — F341 Dysthymic disorder: Secondary | ICD-10-CM | POA: Diagnosis not present

## 2018-01-21 DIAGNOSIS — F4322 Adjustment disorder with anxiety: Secondary | ICD-10-CM | POA: Diagnosis not present

## 2018-02-04 DIAGNOSIS — F341 Dysthymic disorder: Secondary | ICD-10-CM | POA: Diagnosis not present

## 2018-02-04 DIAGNOSIS — F4322 Adjustment disorder with anxiety: Secondary | ICD-10-CM | POA: Diagnosis not present

## 2018-02-09 DIAGNOSIS — M7542 Impingement syndrome of left shoulder: Secondary | ICD-10-CM | POA: Diagnosis not present

## 2018-02-09 DIAGNOSIS — M9903 Segmental and somatic dysfunction of lumbar region: Secondary | ICD-10-CM | POA: Diagnosis not present

## 2018-02-09 DIAGNOSIS — M722 Plantar fascial fibromatosis: Secondary | ICD-10-CM | POA: Diagnosis not present

## 2018-02-09 DIAGNOSIS — M9901 Segmental and somatic dysfunction of cervical region: Secondary | ICD-10-CM | POA: Diagnosis not present

## 2018-02-11 DIAGNOSIS — F341 Dysthymic disorder: Secondary | ICD-10-CM | POA: Diagnosis not present

## 2018-02-11 DIAGNOSIS — F4322 Adjustment disorder with anxiety: Secondary | ICD-10-CM | POA: Diagnosis not present

## 2018-02-16 DIAGNOSIS — M722 Plantar fascial fibromatosis: Secondary | ICD-10-CM | POA: Diagnosis not present

## 2018-02-16 DIAGNOSIS — M9901 Segmental and somatic dysfunction of cervical region: Secondary | ICD-10-CM | POA: Diagnosis not present

## 2018-02-16 DIAGNOSIS — M7542 Impingement syndrome of left shoulder: Secondary | ICD-10-CM | POA: Diagnosis not present

## 2018-02-16 DIAGNOSIS — M9903 Segmental and somatic dysfunction of lumbar region: Secondary | ICD-10-CM | POA: Diagnosis not present

## 2018-02-18 DIAGNOSIS — F4322 Adjustment disorder with anxiety: Secondary | ICD-10-CM | POA: Diagnosis not present

## 2018-02-18 DIAGNOSIS — F341 Dysthymic disorder: Secondary | ICD-10-CM | POA: Diagnosis not present

## 2018-02-25 DIAGNOSIS — F4322 Adjustment disorder with anxiety: Secondary | ICD-10-CM | POA: Diagnosis not present

## 2018-02-25 DIAGNOSIS — F341 Dysthymic disorder: Secondary | ICD-10-CM | POA: Diagnosis not present

## 2018-02-26 DIAGNOSIS — M9903 Segmental and somatic dysfunction of lumbar region: Secondary | ICD-10-CM | POA: Diagnosis not present

## 2018-02-26 DIAGNOSIS — M9901 Segmental and somatic dysfunction of cervical region: Secondary | ICD-10-CM | POA: Diagnosis not present

## 2018-02-26 DIAGNOSIS — M722 Plantar fascial fibromatosis: Secondary | ICD-10-CM | POA: Diagnosis not present

## 2018-02-26 DIAGNOSIS — M7542 Impingement syndrome of left shoulder: Secondary | ICD-10-CM | POA: Diagnosis not present

## 2018-03-04 DIAGNOSIS — F4322 Adjustment disorder with anxiety: Secondary | ICD-10-CM | POA: Diagnosis not present

## 2018-03-04 DIAGNOSIS — F341 Dysthymic disorder: Secondary | ICD-10-CM | POA: Diagnosis not present

## 2018-03-05 DIAGNOSIS — M7542 Impingement syndrome of left shoulder: Secondary | ICD-10-CM | POA: Diagnosis not present

## 2018-03-05 DIAGNOSIS — M722 Plantar fascial fibromatosis: Secondary | ICD-10-CM | POA: Diagnosis not present

## 2018-03-05 DIAGNOSIS — M9903 Segmental and somatic dysfunction of lumbar region: Secondary | ICD-10-CM | POA: Diagnosis not present

## 2018-03-05 DIAGNOSIS — M9901 Segmental and somatic dysfunction of cervical region: Secondary | ICD-10-CM | POA: Diagnosis not present

## 2018-03-11 DIAGNOSIS — F341 Dysthymic disorder: Secondary | ICD-10-CM | POA: Diagnosis not present

## 2018-03-11 DIAGNOSIS — F4322 Adjustment disorder with anxiety: Secondary | ICD-10-CM | POA: Diagnosis not present

## 2018-03-12 DIAGNOSIS — R32 Unspecified urinary incontinence: Secondary | ICD-10-CM | POA: Diagnosis not present

## 2018-03-12 DIAGNOSIS — N393 Stress incontinence (female) (male): Secondary | ICD-10-CM | POA: Diagnosis not present

## 2018-03-12 DIAGNOSIS — Z01411 Encounter for gynecological examination (general) (routine) with abnormal findings: Secondary | ICD-10-CM | POA: Diagnosis not present

## 2018-03-12 DIAGNOSIS — N6320 Unspecified lump in the left breast, unspecified quadrant: Secondary | ICD-10-CM | POA: Diagnosis not present

## 2018-03-13 DIAGNOSIS — M7542 Impingement syndrome of left shoulder: Secondary | ICD-10-CM | POA: Diagnosis not present

## 2018-03-13 DIAGNOSIS — M722 Plantar fascial fibromatosis: Secondary | ICD-10-CM | POA: Diagnosis not present

## 2018-03-13 DIAGNOSIS — M9903 Segmental and somatic dysfunction of lumbar region: Secondary | ICD-10-CM | POA: Diagnosis not present

## 2018-03-13 DIAGNOSIS — M9901 Segmental and somatic dysfunction of cervical region: Secondary | ICD-10-CM | POA: Diagnosis not present

## 2018-03-17 DIAGNOSIS — R52 Pain, unspecified: Secondary | ICD-10-CM | POA: Diagnosis not present

## 2018-03-17 DIAGNOSIS — J4541 Moderate persistent asthma with (acute) exacerbation: Secondary | ICD-10-CM | POA: Diagnosis not present

## 2018-03-24 DIAGNOSIS — N6321 Unspecified lump in the left breast, upper outer quadrant: Secondary | ICD-10-CM | POA: Diagnosis not present

## 2018-03-31 ENCOUNTER — Ambulatory Visit (INDEPENDENT_AMBULATORY_CARE_PROVIDER_SITE_OTHER): Payer: BLUE CROSS/BLUE SHIELD | Admitting: Primary Care

## 2018-03-31 ENCOUNTER — Ambulatory Visit: Payer: BLUE CROSS/BLUE SHIELD | Admitting: Pulmonary Disease

## 2018-03-31 ENCOUNTER — Encounter: Payer: Self-pay | Admitting: Primary Care

## 2018-03-31 DIAGNOSIS — J4530 Mild persistent asthma, uncomplicated: Secondary | ICD-10-CM

## 2018-03-31 MED ORDER — PREDNISONE 10 MG PO TABS: ORAL_TABLET | ORAL | 0 refills | Status: DC

## 2018-03-31 NOTE — Progress Notes (Signed)
@Patient  ID: Sherry Adams, female    DOB: [birth year], 48 y.o.   MRN: [id number]  Chief Complaint  Patient presents with  . Follow-up    States she recently had bronchitis and she wanted a check up. She is still using her neb 4x/day. Her cough has resolved but she is still wheezing.     Referring provider: Ileana Ladd, MD  HPI: 48 year old female, never smoked. PMH significant for mild persistent asthma. Patient of Dr. Sherene Sires, last seen on 11/18/17.    04/02/2018 Patient presents today for acute visit, stats that she recently treated for bronchitis and wants to make sure she is ok to go out of town for the next 13 days. Her cough has improved but still experiencing some dull wheezing. She has been using her nebulizer 4 times a day with improvement. Afebrile.   Allergies  Allergen Reactions  . Ibuprofen     "bronchospams"  . Zyrtec [Cetirizine] Photosensitivity    Immunization History  Administered Date(s) Administered  . Influenza Whole 12/22/2016  . Influenza,inj,Quad PF,6+ Mos 11/19/2016    No past medical history on file.  Tobacco History: Social History   Tobacco Use  Smoking Status Never Smoker  Smokeless Tobacco Never Used   Counseling given: Not Answered   Outpatient Medications Prior to Visit  Medication Sig Dispense Refill  . albuterol (ACCUNEB) 1.25 MG/3ML nebulizer solution Take 1 ampule by nebulization every 6 (six) hours as needed for wheezing.    Marland Kitchen albuterol (PROAIR HFA) 108 (90 Base) MCG/ACT inhaler Inhale 1 puff into the lungs every 6 (six) hours as needed for wheezing or shortness of breath.    Marland Kitchen FLUoxetine (PROZAC) 20 MG tablet Take 20 mg by mouth daily.    . montelukast (SINGULAIR) 10 MG tablet Take 10 mg by mouth at bedtime.    Marland Kitchen omeprazole (PRILOSEC) 20 MG capsule Take 20 mg by mouth daily.    . SYMBICORT 160-4.5 MCG/ACT inhaler TAKE 2 PUFFS BY MOUTH TWICE A DAY 30.6 Inhaler 3   No facility-administered medications prior to visit.      Review of Systems  Review of Systems  Constitutional: Negative.   HENT: Negative.   Respiratory: Positive for wheezing. Negative for cough and shortness of breath.   Cardiovascular: Negative.     Physical Exam  BP 110/70   Pulse 72   Ht 5\' 9"  (1.753 m)   SpO2 98%  Physical Exam Constitutional:      Appearance: She is well-developed.  HENT:     Head: Normocephalic and atraumatic.  Eyes:     Pupils: Pupils are equal, round, and reactive to light.  Neck:     Musculoskeletal: Normal range of motion and neck supple.  Cardiovascular:     Rate and Rhythm: Normal rate and regular rhythm.     Heart sounds: Normal heart sounds. No murmur.  Pulmonary:     Effort: Pulmonary effort is normal. No respiratory distress.     Breath sounds: Normal breath sounds. No wheezing.  Abdominal:     General: Bowel sounds are normal.     Palpations: Abdomen is soft.     Tenderness: There is no abdominal tenderness.  Skin:    General: Skin is warm and dry.     Findings: No erythema or rash.  Neurological:     Mental Status: She is alert and oriented to person, place, and time.  Psychiatric:        Behavior: Behavior normal.  Judgment: Judgment normal.      Lab Results:  CBC    Component Value Date/Time   WBC 10.2 10/22/2016 1655   RBC 4.69 10/22/2016 1655   HGB 13.6 10/22/2016 1655   HCT 40.6 10/22/2016 1655   PLT 290.0 10/22/2016 1655   MCV 86.7 10/22/2016 1655   MCHC 33.6 10/22/2016 1655   RDW 13.3 10/22/2016 1655   LYMPHSABS 3.6 10/22/2016 1655   MONOABS 0.9 10/22/2016 1655   EOSABS 0.3 10/22/2016 1655   BASOSABS 0.1 10/22/2016 1655    BMET No results found for: NA, K, CL, CO2, GLUCOSE, BUN, CREATININE, CALCIUM, GFRNONAA, GFRAA  BNP No results found for: BNP  ProBNP No results found for: PROBNP  Imaging: No results found.   Assessment & Plan:   Mild persistent asthma without complication - Recently treated for bronchitis, reports some dull wheezing -  Exam benign, VSS   - Encouraged patient to start tapering nebulizer treatments as tolerated  - RX for prednisone 20mg  x 5 days to have on hand while out of town incase of asthma flare  - Return if symptoms do not improve or worsen      Glenford Bayley, NP 04/02/2018

## 2018-03-31 NOTE — Patient Instructions (Addendum)
Exam benign   Take Symbicort, proair rescue inhaler and nebulizer with you on work trip   Taper Albuterol nebulizer to morning and evening use as tolerated   Rx: Prednisone 20mg  x 5 days

## 2018-04-02 ENCOUNTER — Encounter: Payer: Self-pay | Admitting: Primary Care

## 2018-04-02 NOTE — Assessment & Plan Note (Addendum)
-   Recently treated for bronchitis, reports some dull wheezing - Exam benign, VSS   - Encouraged patient to start tapering nebulizer treatments as tolerated  - RX for prednisone 20mg  x 5 days to have on hand while out of town incase of asthma flare  - Return if symptoms do not improve or worsen

## 2018-04-03 NOTE — Progress Notes (Signed)
Chart and office note reviewed in detail  > agree with a/p as outlined    

## 2018-04-15 DIAGNOSIS — F341 Dysthymic disorder: Secondary | ICD-10-CM | POA: Diagnosis not present

## 2018-04-15 DIAGNOSIS — F4310 Post-traumatic stress disorder, unspecified: Secondary | ICD-10-CM | POA: Diagnosis not present

## 2018-04-15 DIAGNOSIS — F4322 Adjustment disorder with anxiety: Secondary | ICD-10-CM | POA: Diagnosis not present

## 2018-04-16 ENCOUNTER — Ambulatory Visit (INDEPENDENT_AMBULATORY_CARE_PROVIDER_SITE_OTHER): Payer: BLUE CROSS/BLUE SHIELD | Admitting: Physician Assistant

## 2018-04-16 ENCOUNTER — Encounter: Payer: Self-pay | Admitting: Physician Assistant

## 2018-04-16 VITALS — BP 124/73 | HR 66 | Ht 69.0 in | Wt 190.0 lb

## 2018-04-16 DIAGNOSIS — F331 Major depressive disorder, recurrent, moderate: Secondary | ICD-10-CM | POA: Diagnosis not present

## 2018-04-16 DIAGNOSIS — F509 Eating disorder, unspecified: Secondary | ICD-10-CM

## 2018-04-16 DIAGNOSIS — F3281 Premenstrual dysphoric disorder: Secondary | ICD-10-CM | POA: Diagnosis not present

## 2018-04-16 DIAGNOSIS — F411 Generalized anxiety disorder: Secondary | ICD-10-CM

## 2018-04-16 MED ORDER — CLONAZEPAM 0.5 MG PO TABS: 0.2500 mg | ORAL_TABLET | Freq: Two times a day (BID) | ORAL | 1 refills | Status: DC | PRN

## 2018-04-16 MED ORDER — FLUOXETINE HCL 10 MG PO CAPS: ORAL_CAPSULE | ORAL | 1 refills | Status: DC

## 2018-04-16 NOTE — Patient Instructions (Signed)
Increasing Prozac to 30 mg a day.  Then about 5 days before you start your period go up to 40mg .  When your period starts, go back to 30 mg.
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
## 2018-04-22 DIAGNOSIS — F341 Dysthymic disorder: Secondary | ICD-10-CM | POA: Diagnosis not present

## 2018-04-22 DIAGNOSIS — F4322 Adjustment disorder with anxiety: Secondary | ICD-10-CM | POA: Diagnosis not present

## 2018-04-22 DIAGNOSIS — F4310 Post-traumatic stress disorder, unspecified: Secondary | ICD-10-CM | POA: Diagnosis not present

## 2018-04-27 DIAGNOSIS — M6289 Other specified disorders of muscle: Secondary | ICD-10-CM | POA: Diagnosis not present

## 2018-04-27 DIAGNOSIS — M6281 Muscle weakness (generalized): Secondary | ICD-10-CM | POA: Diagnosis not present

## 2018-04-27 DIAGNOSIS — N3946 Mixed incontinence: Secondary | ICD-10-CM | POA: Diagnosis not present

## 2018-04-27 DIAGNOSIS — N3942 Incontinence without sensory awareness: Secondary | ICD-10-CM | POA: Diagnosis not present

## 2018-04-29 DIAGNOSIS — F4322 Adjustment disorder with anxiety: Secondary | ICD-10-CM | POA: Diagnosis not present

## 2018-04-29 DIAGNOSIS — F341 Dysthymic disorder: Secondary | ICD-10-CM | POA: Diagnosis not present

## 2018-04-29 DIAGNOSIS — F4310 Post-traumatic stress disorder, unspecified: Secondary | ICD-10-CM | POA: Diagnosis not present

## 2018-05-01 DIAGNOSIS — Z5181 Encounter for therapeutic drug level monitoring: Secondary | ICD-10-CM | POA: Diagnosis not present

## 2018-05-01 DIAGNOSIS — K219 Gastro-esophageal reflux disease without esophagitis: Secondary | ICD-10-CM | POA: Diagnosis not present

## 2018-05-01 DIAGNOSIS — Z1159 Encounter for screening for other viral diseases: Secondary | ICD-10-CM | POA: Diagnosis not present

## 2018-05-01 DIAGNOSIS — E782 Mixed hyperlipidemia: Secondary | ICD-10-CM | POA: Diagnosis not present

## 2018-05-01 DIAGNOSIS — J45998 Other asthma: Secondary | ICD-10-CM | POA: Diagnosis not present

## 2018-05-11 ENCOUNTER — Other Ambulatory Visit: Payer: Self-pay | Admitting: Physician Assistant

## 2018-05-14 DIAGNOSIS — F4322 Adjustment disorder with anxiety: Secondary | ICD-10-CM | POA: Diagnosis not present

## 2018-05-14 DIAGNOSIS — F341 Dysthymic disorder: Secondary | ICD-10-CM | POA: Diagnosis not present

## 2018-05-14 DIAGNOSIS — F4310 Post-traumatic stress disorder, unspecified: Secondary | ICD-10-CM | POA: Diagnosis not present

## 2018-05-20 DIAGNOSIS — F4310 Post-traumatic stress disorder, unspecified: Secondary | ICD-10-CM | POA: Diagnosis not present

## 2018-05-20 DIAGNOSIS — F341 Dysthymic disorder: Secondary | ICD-10-CM | POA: Diagnosis not present

## 2018-05-20 DIAGNOSIS — F4322 Adjustment disorder with anxiety: Secondary | ICD-10-CM | POA: Diagnosis not present

## 2018-05-26 ENCOUNTER — Ambulatory Visit: Payer: BLUE CROSS/BLUE SHIELD | Admitting: Internal Medicine

## 2018-05-27 DIAGNOSIS — D1801 Hemangioma of skin and subcutaneous tissue: Secondary | ICD-10-CM | POA: Diagnosis not present

## 2018-05-27 DIAGNOSIS — D2271 Melanocytic nevi of right lower limb, including hip: Secondary | ICD-10-CM | POA: Diagnosis not present

## 2018-05-27 DIAGNOSIS — L2089 Other atopic dermatitis: Secondary | ICD-10-CM | POA: Diagnosis not present

## 2018-05-27 DIAGNOSIS — D225 Melanocytic nevi of trunk: Secondary | ICD-10-CM | POA: Diagnosis not present

## 2018-05-28 ENCOUNTER — Encounter: Payer: Self-pay | Admitting: Physician Assistant

## 2018-05-28 ENCOUNTER — Ambulatory Visit (INDEPENDENT_AMBULATORY_CARE_PROVIDER_SITE_OTHER): Payer: BLUE CROSS/BLUE SHIELD | Admitting: Physician Assistant

## 2018-05-28 DIAGNOSIS — F3281 Premenstrual dysphoric disorder: Secondary | ICD-10-CM | POA: Diagnosis not present

## 2018-05-28 DIAGNOSIS — F411 Generalized anxiety disorder: Secondary | ICD-10-CM

## 2018-05-28 DIAGNOSIS — F331 Major depressive disorder, recurrent, moderate: Secondary | ICD-10-CM

## 2018-05-28 NOTE — Progress Notes (Signed)
[facility] Med Check  Patient ID: Sherry Adams,  MRN: [id number]  PCP: Ileana Ladd, MD  Date of Evaluation: 05/28/2018 Time spent:15 minutes  Chief Complaint:  Chief Complaint    Follow-up      HISTORY/CURRENT STATUS: HPI Here for routine med check.   At LOV, we increased Prozac and she has felt much better! She didn't even need to increase the dose premenstrually either.  The PMDD was not a problem at all with this cycle.  One issue she has is that her periods are a little irregular so it is difficult for her to know when to increase the Prozac.  Patient denies loss of interest in usual activities and is able to enjoy things.  Denies decreased energy or motivation.  Appetite has not changed.  No extreme sadness, tearfulness, or feelings of hopelessness.  Denies any changes in concentration, making decisions or remembering things.  Denies suicidal or homicidal thoughts.  Anxiety is well-controlled.  She continues to see her therapist, go to Al-Anon, eat a healthy diet and exercise and doing yoga.  Those things are very helpful for her.  She usually only needs the Klonopin a few times a week.  Denies muscle or joint pain, stiffness, or dystonia.  Denies dizziness, syncope, seizures, numbness, tingling, tremor, tics, unsteady gait, slurred speech, confusion.   Individual Medical History/ Review of Systems: Changes? :No   Allergies: Ibuprofen and Zyrtec [cetirizine]  Current Medications:  Current Outpatient Medications:  .  albuterol (ACCUNEB) 1.25 MG/3ML nebulizer solution, Take 1 ampule by nebulization every 6 (six) hours as needed for wheezing., Disp: , Rfl:  .  albuterol (PROAIR HFA) 108 (90 Base) MCG/ACT inhaler, Inhale 1 puff into the lungs every 6 (six) hours as needed for wheezing or shortness of breath., Disp: , Rfl:  .  clonazePAM (KLONOPIN) 0.5 MG tablet, Take 0.5-2 tablets (0.25-1 mg total) by mouth 2 (two) times daily as needed for anxiety., Disp: 30 tablet, Rfl:  1 .  FLUoxetine (PROZAC) 10 MG capsule, TAKE 3 CAPS IN AM AND INCREASE TO 4 CAPS IN AM 5 DAY BEFORE MENSES. ONCE MENSES STARTS, DECREASE BACK TO 3 CAPS EVERY DAY (Patient taking differently: 30 mg daily. ), Disp: 270 capsule, Rfl: 0 .  montelukast (SINGULAIR) 10 MG tablet, Take 10 mg by mouth at bedtime., Disp: , Rfl:  .  omeprazole (PRILOSEC) 20 MG capsule, Take 20 mg by mouth daily. Qod sometimes., Disp: , Rfl:  .  SYMBICORT 160-4.5 MCG/ACT inhaler, TAKE 2 PUFFS BY MOUTH TWICE A DAY, Disp: 30.6 Inhaler, Rfl: 3 .  predniSONE (DELTASONE) 10 MG tablet, Take 2 tabs x 5 days (Patient not taking: Reported on 04/16/2018), Disp: 10 tablet, Rfl: 0 Medication Side Effects: none  Family Medical/ Social History: Changes? No  MENTAL HEALTH EXAM:  There were no vitals taken for this visit.There is no height or weight on file to calculate BMI.  General Appearance: Casual and Well Groomed  Eye Contact:  Good  Speech:  Clear and Coherent  Volume:  Normal  Mood:  Euthymic  Affect:  Appropriate  Thought Process:  Goal Directed  Orientation:  Full (Time, Place, and Person)  Thought Content: Logical   Suicidal Thoughts:  No  Homicidal Thoughts:  No  Memory:  WNL  Judgement:  Good  Insight:  Good  Psychomotor Activity:  Normal  Concentration:  Concentration: Good  Recall:  Good  Fund of Knowledge: Good  Language: Good  Assets:  Desire for Improvement  ADL's:  Intact  Cognition: WNL  Prognosis:  Good    DIAGNOSES:    ICD-10-CM   1. Major depressive disorder, recurrent episode, moderate (HCC) F33.1   2. Generalized anxiety disorder F41.1   3. PMDD (premenstrual dysphoric disorder) F32.81     Receiving Psychotherapy: Yes With Rocky Link   RECOMMENDATIONS: I'm glad to see she's doing so much better! Cont Prozac 30mg  qd. Cont Klonopin 0.5 mg 1/2-1 twice daily as needed. Continue psychotherapy with Mertie Clause Return in 3 months.  Melony Overly, PA-C   This record has been created using  [employer].  Chart creation errors have been sought, but may not always have been located and corrected. Such creation errors do not reflect on the standard of medical care.

## 2018-05-29 ENCOUNTER — Encounter: Payer: Self-pay | Admitting: Internal Medicine

## 2018-05-29 ENCOUNTER — Ambulatory Visit (INDEPENDENT_AMBULATORY_CARE_PROVIDER_SITE_OTHER): Payer: BLUE CROSS/BLUE SHIELD | Admitting: Internal Medicine

## 2018-05-29 VITALS — BP 118/68 | HR 69

## 2018-05-29 DIAGNOSIS — J4530 Mild persistent asthma, uncomplicated: Secondary | ICD-10-CM | POA: Diagnosis not present

## 2018-05-29 NOTE — Progress Notes (Signed)
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44 identified  lots of outdoor allergies on shots / theophylline never able to stop it or tried on  other maint rx, missed lots of school and saw pulmonary doctor helped a lot age 8 when taught how to use inhaler and not a lot better then  changed from [place] to [place] after last daughter born 52 and generally better since then moved to [place] around 2014 had only one bad episode of flare related to gerd better on rx for gerd and maintained on budesonide bid and reflux meds plus saba avg nightly  And referred to pulmonary clinic 10/22/2016 by Dr  Jacelyn Grip    History of Present Illness  10/22/2016 1st [facility] office visit/ [name]   Chief Complaint  Patient presents with  . Pulmonary Consult    Self referral. She states she was dxed with Asthma age 48. She states had illness 2 months ago that made her symptoms flare.  Today she reports she is back at her normal baseline, but wanted to est care for Asthma. She uses proair 2 puffs every night.   back to baseline = avg saba once every  hs along with bud neb bid  Afraid to ex due to "allergies"  / does yoga ok but no indoor aerobics Cough is no  longer an issue/ maint on prilosec 20 mg each pm  rec Prilosec 20 mg  Take 30-60 min before first meal of the day  GERD  Diet   Plan A = Automatic = Symbicort 160 Take 2 puffs first thing in am and then another 2 puffs about 12 hours later.  Work on inhaler technique:  Plan B = Backup Only use your albuterol as a rescue medication  Plan C = Crisis - only use your albuterol nebulizer if you first try Plan B and it fails to help > ok to use the nebulizer up to every 4 hours but if start needing it regularly call for immediate appointment    01/22/2017  f/u ov/[name] re: mild chronic asthma Chief Complaint  Patient  presents with  . Follow-up    Breathing is doing well. She is using her albuterol inhaler 2 x per wk on average. She rarely uses neb.    still not using symbicort 160 perfectly regularly 2bid or effectively  and   using allegra as maint rx not as prn but overall much better since started symb 160  Has not tried ex yet "afraid of allergies/ wheezing" and wonders if/why still needs saba prior to ex   Noct fine rec Plan A = Automatic = Symbicort 160 Take 2 puffs first thing in am and then another 2 puffs about 12 hours later.  Work on inhaler technique:  Plan B = Backup Only use your albuterol  Plan C = Crisis - only use your albuterol nebulizer if you first try Plan B             11/18/2017  f/u ov/[name] re: mild chronic asthma - worse on symb 160 one twice daily so back to 2bid Chief Complaint  Patient presents with  . Follow-up    Breathing is doing well. She is using her albuterol on average once per month.    Dyspnea:  Ex bike up  an hour qod s problems Cough: no Sleeping: 30 degrees due to gerd SABA use: once a month  02: none   rec No change rx   05/29/2018  f/u ov/[name] re: mild chronic asthma/ still has dog in bedroom  On symb 160/singulair  Chief Complaint  Patient presents with  . Follow-up    Breathing is doing well. She rarely uses her proair.    Dyspnea:  Not limited by breathing from desired activities  / some aerobics Cough: no Sleeping: 30 degrees SABA use: rarely  02: none    No obvious day to day or daytime variability or assoc excess/ purulent sputum or mucus plugs or hemoptysis or cp or chest tightness, subjective wheeze or overt sinus or hb symptoms.   Sleeping as above without nocturnal  or early am exacerbation  of respiratory  c/o's or need for noct saba. Also denies any obvious fluctuation of symptoms with weather or environmental changes or other aggravating or alleviating factors except as outlined above   No unusual exposure hx or h/o childhood  pna  or knowledge of premature birth.  Current Allergies, Complete Past Medical History, Past Surgical History, Family History, and Social History were reviewed in [employer] record.  ROS  The following are not active complaints unless bolded Hoarseness, sore throat, dysphagia, dental problems, itching, sneezing,  nasal congestion or discharge of excess mucus or purulent secretions, ear ache,   fever, chills, sweats, unintended wt loss or wt gain, classically pleuritic or exertional cp,  orthopnea pnd or arm/hand swelling  or leg swelling, presyncope, palpitations, abdominal pain, anorexia, nausea, vomiting, diarrhea  or change in bowel habits or change in bladder habits, change in stools or change in urine, dysuria, hematuria,  rash, arthralgias, visual complaints, headache, numbness, weakness or ataxia or problems with walking or coordination,  change in mood or  memory.        Current Meds  Medication Sig  . albuterol (ACCUNEB) 1.25 MG/3ML nebulizer solution Take 1 ampule by nebulization every 6 (six) hours as needed for wheezing.  Marland Kitchen albuterol (PROAIR HFA) 108 (90 Base) MCG/ACT inhaler Inhale 1 puff into the lungs every 6 (six) hours as needed for wheezing or shortness of breath.  . clonazePAM (KLONOPIN) 0.5 MG tablet Take 0.5-2 tablets (0.25-1 mg total) by mouth 2 (two) times daily as needed for anxiety.  Marland Kitchen FLUoxetine (PROZAC) 10 MG capsule TAKE 3 CAPS IN AM AND INCREASE TO 4 CAPS IN AM 5 DAY BEFORE MENSES. ONCE MENSES STARTS, DECREASE BACK TO 3 CAPS EVERY DAY (Patient taking differently: 30 mg daily. )  . montelukast (SINGULAIR) 10 MG tablet Take 10 mg by mouth at bedtime.  Marland Kitchen omeprazole (PRILOSEC) 20 MG capsule Take 20 mg by mouth daily. Qod sometimes.  . SYMBICORT 160-4.5 MCG/ACT inhaler TAKE 2 PUFFS BY MOUTH TWICE A DAY              Objective:   Physical Exam    amb wf nad declined to be weighed    Vital signs reviewed - Note on arrival 02 sats  97% on  RA   HEENT: nl dentition, turbinates bilaterally, and oropharynx. Nl external ear canals without cough reflex   NECK :  without JVD/Nodes/TM/ nl carotid upstrokes bilaterally   LUNGS: no acc muscle use,  Nl contour chest which is clear to A and P bilaterally without cough on insp or exp maneuvers   CV:  RRR  no s3 or murmur or increase in  P2, and no edema   ABD:  soft and nontender with nl inspiratory excursion in the supine position. No bruits or organomegaly appreciated, bowel sounds nl  MS:  Nl gait/ ext warm without deformities, calf tenderness, cyanosis or clubbing No obvious joint restrictions   SKIN: warm and dry without lesions    NEURO:  alert, approp, nl sensorium with  no motor or cerebellar deficits apparent.                   Assessment:

## 2018-05-29 NOTE — Patient Instructions (Signed)
No change recommendations   Please schedule a follow up visit in 6 months but call sooner if needed

## 2018-06-01 ENCOUNTER — Encounter: Payer: Self-pay | Admitting: Internal Medicine

## 2018-06-01 NOTE — Assessment & Plan Note (Signed)
Onset in Childhood Allergy profile 10/22/2016 >  Eos 0.3/  IgE 73  RAST pos dust > dog> cat   Tree> grass, min mold  - Spirometry 10/22/2016  wnl though mild curvature on budesonide neb  s saba prior - 10/22/2016   try symb 160 2bid - 05/29/2018  After extensive coaching inhaler device,  effectiveness =    90%    All goals of chronic asthma control met including optimal function and elimination of symptoms with minimal need for rescue therapy.  Contingencies discussed in full including contacting this office immediately if not controlling the symptoms using the rule of two's.     I had an extended discussion with the patient reviewing all relevant studies completed to date and  lasting 10 minutes of a 15 minute visit    See device teaching which extended face to face time for this visit.  Each maintenance medication was reviewed in detail including emphasizing most importantly the difference between maintenance and prns and under what circumstances the prns are to be triggered using an action plan format that is not reflected in the computer generated alphabetically organized AVS which I have not found useful in most complex patients, especially with respiratory illnesses  Please see AVS for specific instructions unique to this visit that I personally wrote and verbalized to the the pt in detail and then reviewed with pt  by my nurse highlighting any  changes in therapy recommended at today's visit to their plan of care.

## 2018-06-03 DIAGNOSIS — F341 Dysthymic disorder: Secondary | ICD-10-CM | POA: Diagnosis not present

## 2018-06-03 DIAGNOSIS — F4322 Adjustment disorder with anxiety: Secondary | ICD-10-CM | POA: Diagnosis not present

## 2018-06-03 DIAGNOSIS — F4310 Post-traumatic stress disorder, unspecified: Secondary | ICD-10-CM | POA: Diagnosis not present

## 2018-06-16 ENCOUNTER — Telehealth: Payer: Self-pay | Admitting: Internal Medicine

## 2018-06-16 ENCOUNTER — Telehealth: Payer: BLUE CROSS/BLUE SHIELD | Admitting: Primary Care

## 2018-06-16 NOTE — Telephone Encounter (Signed)
Called and spoke with pt stating to her that one of our NPs can do a televisit with her to see if anything needs to be prescribed for her based on what is currently going on. Pt expressed understanding. OV scheduled with pt with Buelah Manis, NP at 4:30. Nothing further needed.

## 2018-06-16 NOTE — Telephone Encounter (Signed)
Pt last seen 05/29/18 by Dr. Sherene Sires. She is c/o sob and fatigue for 1 week. She had been taking 10mg  Prednisone for the past 4 days. She has been using nebulizer and Symbicort. Patient requesting something be called into pharmacy.

## 2018-06-16 NOTE — Telephone Encounter (Signed)
Can't treat this over the phone but not bad enough to go to ER. Not an infection issues,  so needs ov with all active meds in hand

## 2018-06-17 ENCOUNTER — Other Ambulatory Visit: Payer: Self-pay

## 2018-06-17 ENCOUNTER — Telehealth (INDEPENDENT_AMBULATORY_CARE_PROVIDER_SITE_OTHER): Payer: BLUE CROSS/BLUE SHIELD | Admitting: Primary Care

## 2018-06-17 DIAGNOSIS — J4531 Mild persistent asthma with (acute) exacerbation: Secondary | ICD-10-CM | POA: Diagnosis not present

## 2018-06-17 DIAGNOSIS — F4322 Adjustment disorder with anxiety: Secondary | ICD-10-CM | POA: Diagnosis not present

## 2018-06-17 DIAGNOSIS — F341 Dysthymic disorder: Secondary | ICD-10-CM | POA: Diagnosis not present

## 2018-06-17 DIAGNOSIS — F4310 Post-traumatic stress disorder, unspecified: Secondary | ICD-10-CM | POA: Diagnosis not present

## 2018-06-17 MED ORDER — ALBUTEROL SULFATE 1.25 MG/3ML IN NEBU: 1.0000 | INHALATION_SOLUTION | Freq: Four times a day (QID) | RESPIRATORY_TRACT | 6 refills | Status: DC | PRN

## 2018-06-17 MED ORDER — PREDNISONE 10 MG PO TABS: ORAL_TABLET | ORAL | 0 refills | Status: DC

## 2018-06-17 NOTE — Progress Notes (Signed)
Virtual Visit via Telephone Note  I connected with Sherry Adams on 06/17/18 at 10:45 AM EDT by telephone and verified that I am speaking with the correct person using two identifiers.   I discussed the limitations, risks, security and privacy concerns of performing an evaluation and management service by telephone and the availability of in person appointments. I also discussed with the patient that there may be a patient responsible charge related to this service. The patient expressed understanding and agreed to proceed.   History of Present Illness: 48 year old female, never smoked. PMH mild persistent asthma. Patient of Dr. Sherene Sires, last seen on 05/29/18. Maintained on Symbicort 160 and Singulair.   Patient at home, agreeing to [employer]. Myself and Delvina Draft present on phone call. My nurse Misty Stanley to set up follow-up visit and mail AVS.   Patient complains of fatigue, HA, sore throat and wheezing x 1 week. Patient has history of asthma. Continues Symbicort twice daily, she has been requiring her nebulizer 3 times a day. Took 10mg  prednisone that she had one hand with no improvement. Took 20mg  last night and felt much better but still required her nebulizer. Takes Singulair daily. She did have contact with her daughter who lives in [place] and has been driving her elderly mother to specialist doctors apts last week. Checked temperature last night and is afebrile.    Observations/Objective: - Temp 98.1 - BP 107/74  Assessment and Plan:   Asthma exacerbation - Symptoms most consistent with asthma exacerbation d/t seasonal allergies or viral upper respiratory infection  - Low suspicion for Covid-19, I will add some information for reference regarding corona virus in AVS  RX: - Prednisone taper sent to pharmacy  Recommendations: - Continue Symbicort 160 twp puffs twice daily - Continue Singulair 10mg  daily - Consider flonase nasal spray if having sinus congestion/post nasal drip  - Use  Albuterol rescue inhaler/nebulzier every 4-6 hours for shortness of breath/wheezing - Tylenol 1 gram every 6 hours for headache, fever, chills (max 4 grams daily)  Follow Up Instructions:  - Follow up scheduled for September with Dr. Sherene Sires   I discussed the assessment and treatment plan with the patient. The patient was provided an opportunity to ask questions and all were answered. The patient agreed with the plan and demonstrated an understanding of the instructions.   The patient was advised to call back or seek an in-person evaluation if the symptoms worsen or if the condition fails to improve as anticipated.  I provided 30 minutes of non-face-to-face time during this encounter. Phone call 10:45-11:15.    Glenford Bayley, NP

## 2018-06-17 NOTE — Patient Instructions (Signed)
Thank you for letting me speak with you today regarding your asthma symptoms Symptoms most consistent with asthma exacerbation d/t seasonal allergies or viral upper respiratory infection  Low suspicion for Covid-19, I will add some information for your reference regarding corona virus  Recommendations: - Continue Symbicort 160 twice daily - Continue Singulair daily - Consider flonase nasal spray if having sinus congestion/post nasal drip  - Use Albuterol rescue inhaler/nebulzier every 4-6 hours for shortness of breath/wheezing - Tylenol 1 gram every 6 hours for headache, fever, chills (max 4 grams daily)  RX: - Prednisone taper sent to pharmacy  Follow up: - September with Dr. Sherene Sires  - Call office sooner if symptoms do not improve or worsen     Asthma, Adult  Asthma is a long-term (chronic) condition in which the airways get tight and narrow. The airways are the breathing passages that lead from the nose and mouth down into the lungs. A person with asthma will have times when symptoms get worse. These are called asthma attacks. They can cause coughing, whistling sounds when you breathe (wheezing), shortness of breath, and chest pain. They can make it hard to breathe. There is no cure for asthma, but medicines and lifestyle changes can help control it. There are many things that can bring on an asthma attack or make asthma symptoms worse (triggers). Common triggers include:  Mold.  Dust.  Cigarette smoke.  Cockroaches.  Things that can cause allergy symptoms (allergens). These include animal skin flakes (dander) and pollen from trees or grass.  Things that pollute the air. These may include household cleaners, wood smoke, smog, or chemical odors.  Cold air, weather changes, and wind.  Crying or laughing hard.  Stress.  Certain medicines or drugs.  Certain foods such as dried fruit, potato chips, and grape juice.  Infections, such as a cold or the flu.  Certain medical  conditions or diseases.  Exercise or tiring activities. Asthma may be treated with medicines and by staying away from the things that cause asthma attacks. Types of medicines may include:  Controller medicines. These help prevent asthma symptoms. They are usually taken every day.  Fast-acting reliever or rescue medicines. These quickly relieve asthma symptoms. They are used as needed and provide short-term relief.  Allergy medicines if your attacks are brought on by allergens.  Medicines to help control the body's defense (immune) system. Follow these instructions at home: Avoiding triggers in your home  Change your heating and air conditioning filter often.  Limit your use of fireplaces and wood stoves.  Get rid of pests (such as roaches and mice) and their droppings.  Throw away plants if you see mold on them.  Clean your floors. Dust regularly. Use cleaning products that do not smell.  Have someone vacuum when you are not home. Use a vacuum cleaner with a HEPA filter if possible.  Replace carpet with wood, tile, or vinyl flooring. Carpet can trap animal skin flakes and dust.  Use allergy-proof pillows, mattress covers, and box spring covers.  Wash bed sheets and blankets every week in hot water. Dry them in a dryer.  Keep your bedroom free of any triggers.  Avoid pets and keep windows closed when things that cause allergy symptoms are in the air.  Use blankets that are made of polyester or cotton.  Clean bathrooms and kitchens with bleach. If possible, have someone repaint the walls in these rooms with mold-resistant paint. Keep out of the rooms that are being cleaned and  painted.  Wash your hands often with soap and water. If soap and water are not available, use hand sanitizer.  Do not allow anyone to smoke in your home. General instructions  Take over-the-counter and prescription medicines only as told by your doctor. ? Talk with your doctor if you have questions  about how or when to take your medicines. ? Make note if you need to use your medicines more often than usual.  Do not use any products that contain nicotine or tobacco, such as cigarettes and e-cigarettes. If you need help quitting, ask your doctor.  Stay away from secondhand smoke.  Avoid doing things outdoors when allergen counts are high and when air quality is low.  Wear a ski mask when doing outdoor activities in the winter. The mask should cover your nose and mouth. Exercise indoors on cold days if you can.  Warm up before you exercise. Take time to cool down after exercise.  Use a peak flow meter as told by your doctor. A peak flow meter is a tool that measures how well the lungs are working.  Keep track of the peak flow meter's readings. Write them down.  Follow your asthma action plan. This is a written plan for taking care of your asthma and treating your attacks.  Make sure you get all the shots (vaccines) that your doctor recommends. Ask your doctor about a flu shot and a pneumonia shot.  Keep all follow-up visits as told by your doctor. This is important. Contact a doctor if:  You have wheezing, shortness of breath, or a cough even while taking medicine to prevent attacks.  The mucus you cough up (sputum) is thicker than usual.  The mucus you cough up changes from clear or white to yellow, green, gray, or bloody.  You have problems from the medicine you are taking, such as: ? A rash. ? Itching. ? Swelling. ? Trouble breathing.  You need reliever medicines more than 2-3 times a week.  Your peak flow reading is still at 50-79% of your personal best after following the action plan for 1 hour.  You have a fever. Get help right away if:  You seem to be worse and are not responding to medicine during an asthma attack.  You are short of breath even at rest.  You get short of breath when doing very little activity.  You have trouble eating, drinking, or talking.   You have chest pain or tightness.  You have a fast heartbeat.  Your lips or fingernails start to turn blue.  You are light-headed or dizzy, or you faint.  Your peak flow is less than 50% of your personal best.  You feel too tired to breathe normally. Summary  Asthma is a long-term (chronic) condition in which the airways get tight and narrow. An asthma attack can make it hard to breathe.  Asthma cannot be cured, but medicines and lifestyle changes can help control it.  Make sure you understand how to avoid triggers and how and when to use your medicines. This information is not intended to replace advice given to you by your health care provider. Make sure you discuss any questions you have with your health care provider. Document Released: 08/28/2007 Document Revised: 04/15/2016 Document Reviewed: 04/15/2016 Elsevier Interactive Patient Education  2019 Elsevier Inc.    Allergies, Adult An allergy means that your body reacts to something that bothers it (allergen). It is not a normal reaction. This can happen from something that  you:  Eat.  Breathe in.  Touch. You can have an allergy (be allergic) to:  Outdoor things, like: ? Pollen. ? Grass. ? Weeds.  Indoor things, like: ? Dust. ? Smoke. ? Pet dander.  Foods.  Medicines.  Things that bother your skin, like: ? Detergents. ? Chemicals. ? Latex.  Perfume.  Bugs. An allergy cannot spread from person to person (is not contagious). Follow these instructions at home:         Stay away from things that you know you are allergic to.  If you have allergies to things in the air, wash out your nose each day. Do it with one of these: ? A salt-water (saline) spray. ? A container (neti pot).  Take over-the-counter and prescription medicines only as told by your doctor.  Keep all follow-up visits as told by your doctor. This is important.  If you are at risk for a very bad allergy reaction  (anaphylaxis), keep an auto-injector with you all the time. This is called an epinephrine injection. ? This is pre-measured medicine with a needle. You can put it into your skin by yourself. ? Right after you have a very bad allergy reaction, you or a person with you must give the medicine in less than a few minutes. This is an emergency.  If you have ever had a very bad allergy reaction, wear a medical alert bracelet or necklace. Your very bad allergy should be written on it. Contact a health care provider if:  Your symptoms do not get better with treatment. Get help right away if:  You have symptoms of a very bad allergy reaction. These include: ? A swollen mouth, tongue, or throat. ? Pain or tightness in your chest. ? Trouble breathing. ? Being short of breath. ? Dizziness. ? Fainting. ? Very bad pain in your belly (abdomen). ? Throwing up (vomiting). ? Watery poop (diarrhea). Summary  An allergy means that your body reacts to something that bothers it (allergen). It is not a normal reaction.  Stay away from things that make your body react.  Take over-the-counter and prescription medicines only as told by your doctor.  If you are at risk for a very bad allergy reaction, carry an auto-injector (epinephrine injection) all the time. Also, wear a medical alert bracelet or necklace so people know about your allergy. This information is not intended to replace advice given to you by your health care provider. Make sure you discuss any questions you have with your health care provider. Document Released: 07/06/2012 Document Revised: 06/24/2016 Document Reviewed: 06/24/2016 Elsevier Interactive Patient Education  2019 [employer].    Coronavirus (COVID-19) Are you at risk?  Are you at risk for the Coronavirus (COVID-19)?  To be considered HIGH RISK for Coronavirus (COVID-19), you have to meet the following criteria:  . Traveled to [place], [place], [place], [place] or [place]; or in  the [place] to [place], [place], [place], or [place]; and have fever, cough, and shortness of breath within the last 2 weeks of travel OR . Been in close contact with a person diagnosed with COVID-19 within the last 2 weeks and have fever, cough, and shortness of breath . IF YOU DO NOT MEET THESE CRITERIA, YOU ARE CONSIDERED LOW RISK FOR COVID-19.  What to do if you are HIGH RISK for COVID-19?  Marland Kitchen If you are having a medical emergency, call 911. . Seek medical care right away. Before you go to a doctor's office, urgent  care or emergency department, call ahead and tell them about your recent travel, contact with someone diagnosed with COVID-19, and your symptoms. You should receive instructions from your physician's office regarding next steps of care.  . When you arrive at healthcare provider, tell the healthcare staff immediately you have returned from visiting [place], [place], [place], [place] or [place]; or traveled in the [place] to [place], [place], [place], or [place]; in the last two weeks or you have been in close contact with a person diagnosed with COVID-19 in the last 2 weeks.   . Tell the health care staff about your symptoms: fever, cough and shortness of breath. . After you have been seen by a medical provider, you will be either: o Tested for (COVID-19) and discharged home on quarantine except to seek medical care if symptoms worsen, and asked to  - Stay home and avoid contact with others until you get your results (4-5 days)  - Avoid travel on public transportation if possible (such as bus, train, or airplane) or o Sent to the Emergency Department by EMS for evaluation, COVID-19 testing, and possible admission depending on your condition and test results.  What to do if you are LOW RISK for COVID-19?  Reduce your risk of any infection by using the same precautions used for avoiding the common cold or flu:  Marland Kitchen Wash your hands often with soap and warm  water for at least 20 seconds.  If soap and water are not readily available, use an alcohol-based hand sanitizer with at least 60% alcohol.  . If coughing or sneezing, cover your mouth and nose by coughing or sneezing into the elbow areas of your shirt or coat, into a tissue or into your sleeve (not your hands). . Avoid shaking hands with others and consider head nods or verbal greetings only. . Avoid touching your eyes, nose, or mouth with unwashed hands.  . Avoid close contact with people who are sick. . Avoid places or events with large numbers of people in one location, like concerts or sporting events. . Carefully consider travel plans you have or are making. . If you are planning any travel outside or inside the [name], visit the CDC's Travelers' Health webpage for the latest health notices. . If you have some symptoms but not all symptoms, continue to monitor at home and seek medical attention if your symptoms worsen. . If you are having a medical emergency, call 911.   ADDITIONAL HEALTHCARE OPTIONS FOR PATIENTS  [facility] / e-Visit: [email or web address]         [facility]: [phone]  [facility]: [phone]                   [facility]: [phone]

## 2018-06-17 NOTE — Progress Notes (Signed)
Chart and office note reviewed in detail  > agree with a/p as outlined    

## 2018-06-24 DIAGNOSIS — F341 Dysthymic disorder: Secondary | ICD-10-CM | POA: Diagnosis not present

## 2018-06-24 DIAGNOSIS — F4322 Adjustment disorder with anxiety: Secondary | ICD-10-CM | POA: Diagnosis not present

## 2018-06-24 DIAGNOSIS — F4310 Post-traumatic stress disorder, unspecified: Secondary | ICD-10-CM | POA: Diagnosis not present

## 2018-07-01 DIAGNOSIS — F4310 Post-traumatic stress disorder, unspecified: Secondary | ICD-10-CM | POA: Diagnosis not present

## 2018-07-01 DIAGNOSIS — F341 Dysthymic disorder: Secondary | ICD-10-CM | POA: Diagnosis not present

## 2018-07-01 DIAGNOSIS — F4322 Adjustment disorder with anxiety: Secondary | ICD-10-CM | POA: Diagnosis not present

## 2018-07-09 DIAGNOSIS — F4322 Adjustment disorder with anxiety: Secondary | ICD-10-CM | POA: Diagnosis not present

## 2018-07-09 DIAGNOSIS — F341 Dysthymic disorder: Secondary | ICD-10-CM | POA: Diagnosis not present

## 2018-07-09 DIAGNOSIS — F4310 Post-traumatic stress disorder, unspecified: Secondary | ICD-10-CM | POA: Diagnosis not present

## 2018-07-15 DIAGNOSIS — F4322 Adjustment disorder with anxiety: Secondary | ICD-10-CM | POA: Diagnosis not present

## 2018-07-15 DIAGNOSIS — F4310 Post-traumatic stress disorder, unspecified: Secondary | ICD-10-CM | POA: Diagnosis not present

## 2018-07-15 DIAGNOSIS — F341 Dysthymic disorder: Secondary | ICD-10-CM | POA: Diagnosis not present

## 2018-07-22 DIAGNOSIS — F4310 Post-traumatic stress disorder, unspecified: Secondary | ICD-10-CM | POA: Diagnosis not present

## 2018-07-22 DIAGNOSIS — F341 Dysthymic disorder: Secondary | ICD-10-CM | POA: Diagnosis not present

## 2018-07-22 DIAGNOSIS — F4322 Adjustment disorder with anxiety: Secondary | ICD-10-CM | POA: Diagnosis not present

## 2018-07-29 DIAGNOSIS — F341 Dysthymic disorder: Secondary | ICD-10-CM | POA: Diagnosis not present

## 2018-07-29 DIAGNOSIS — F4322 Adjustment disorder with anxiety: Secondary | ICD-10-CM | POA: Diagnosis not present

## 2018-07-29 DIAGNOSIS — F4310 Post-traumatic stress disorder, unspecified: Secondary | ICD-10-CM | POA: Diagnosis not present

## 2018-08-05 DIAGNOSIS — F4322 Adjustment disorder with anxiety: Secondary | ICD-10-CM | POA: Diagnosis not present

## 2018-08-05 DIAGNOSIS — F4310 Post-traumatic stress disorder, unspecified: Secondary | ICD-10-CM | POA: Diagnosis not present

## 2018-08-05 DIAGNOSIS — F341 Dysthymic disorder: Secondary | ICD-10-CM | POA: Diagnosis not present

## 2018-08-08 ENCOUNTER — Other Ambulatory Visit: Payer: Self-pay | Admitting: Physician Assistant

## 2018-08-12 DIAGNOSIS — F4322 Adjustment disorder with anxiety: Secondary | ICD-10-CM | POA: Diagnosis not present

## 2018-08-12 DIAGNOSIS — F4310 Post-traumatic stress disorder, unspecified: Secondary | ICD-10-CM | POA: Diagnosis not present

## 2018-08-12 DIAGNOSIS — F341 Dysthymic disorder: Secondary | ICD-10-CM | POA: Diagnosis not present

## 2018-08-19 DIAGNOSIS — F341 Dysthymic disorder: Secondary | ICD-10-CM | POA: Diagnosis not present

## 2018-08-19 DIAGNOSIS — F4322 Adjustment disorder with anxiety: Secondary | ICD-10-CM | POA: Diagnosis not present

## 2018-08-19 DIAGNOSIS — F4310 Post-traumatic stress disorder, unspecified: Secondary | ICD-10-CM | POA: Diagnosis not present

## 2018-08-26 DIAGNOSIS — F4310 Post-traumatic stress disorder, unspecified: Secondary | ICD-10-CM | POA: Diagnosis not present

## 2018-08-26 DIAGNOSIS — F341 Dysthymic disorder: Secondary | ICD-10-CM | POA: Diagnosis not present

## 2018-08-26 DIAGNOSIS — F4322 Adjustment disorder with anxiety: Secondary | ICD-10-CM | POA: Diagnosis not present

## 2018-08-27 ENCOUNTER — Other Ambulatory Visit: Payer: Self-pay

## 2018-08-27 ENCOUNTER — Ambulatory Visit (INDEPENDENT_AMBULATORY_CARE_PROVIDER_SITE_OTHER): Payer: BC Managed Care – PPO | Admitting: Physician Assistant

## 2018-08-27 ENCOUNTER — Encounter: Payer: Self-pay | Admitting: Physician Assistant

## 2018-08-27 DIAGNOSIS — F3281 Premenstrual dysphoric disorder: Secondary | ICD-10-CM | POA: Diagnosis not present

## 2018-08-27 DIAGNOSIS — F411 Generalized anxiety disorder: Secondary | ICD-10-CM

## 2018-08-27 DIAGNOSIS — F331 Major depressive disorder, recurrent, moderate: Secondary | ICD-10-CM

## 2018-08-27 MED ORDER — CLONAZEPAM 0.5 MG PO TABS: 0.2500 mg | ORAL_TABLET | Freq: Two times a day (BID) | ORAL | 1 refills | Status: DC | PRN

## 2018-08-27 NOTE — Progress Notes (Signed)
[facility]  Patient ID: Sherry Adams,  MRN: [id number]  PCP: Ileana Ladd, MD  Date of Evaluation: 08/27/2018 Time spent:15 minutes  Chief Complaint:  Chief Complaint    Depression; Anxiety; Follow-up      HISTORY/CURRENT STATUS: HPI For 3 med check.  Is doing well for the most part.  Her Mother lives were and she has Borderline Personality D/O, her dtr also lives w/ her and has mental health challenges, as does a stepson. So it's been difficult just day to day things.  But overall she is handling things well.  States the Prozac at 30 mg daily is working really well.  She has been very hesitant to increase to 40 mg before menses.  There have been a few days that she has felt that it would be helpful but has been nervous to increase it.  She is able to enjoy things.  Her energy and motivation levels are good.  She does not cry easily.  She is not isolating any more than is necessary due to the coronavirus pandemic.  Anxiety is well controlled with the Klonopin and the Prozac.  She does not often need the Klonopin but it is a nice "safety net" to have if she does not need it.  Denies dizziness, syncope, seizures, numbness, tingling, tremor, tics, unsteady gait, slurred speech, confusion. Denies muscle or joint pain, stiffness, or dystonia.  Individual Medical History/ Review of Systems: Changes? :No    Past medications for mental health diagnoses include: Zoloft, Effexor, Prozac, Klonopin  Allergies: Ibuprofen and Zyrtec [cetirizine]  Current Medications:  Current Outpatient Medications:  .  albuterol (ACCUNEB) 1.25 MG/3ML nebulizer solution, Take 3 mLs (1.25 mg total) by nebulization every 6 (six) hours as needed for wheezing., Disp: 75 mL, Rfl: 6 .  albuterol (PROAIR HFA) 108 (90 Base) MCG/ACT inhaler, Inhale 1 puff into the lungs every 6 (six) hours as needed for wheezing or shortness of breath., Disp: , Rfl:  .  clonazePAM (KLONOPIN) 0.5 MG tablet, Take 0.5-2  tablets (0.25-1 mg total) by mouth 2 (two) times daily as needed for anxiety., Disp: 30 tablet, Rfl: 1 .  FLUoxetine (PROZAC) 10 MG capsule, TAKE 3 CAPS IN AM AND INCREASE TO 4 CAPS IN AM 5 DAY BEFORE MENSES. ONCE MENSES STARTS, DECREASE BACK TO 3 CAPS EVERY DAY, Disp: 270 capsule, Rfl: 0 .  montelukast (SINGULAIR) 10 MG tablet, Take 10 mg by mouth at bedtime., Disp: , Rfl:  .  omeprazole (PRILOSEC) 20 MG capsule, Take 20 mg by mouth daily. Qod sometimes., Disp: , Rfl:  .  SYMBICORT 160-4.5 MCG/ACT inhaler, TAKE 2 PUFFS BY MOUTH TWICE A DAY, Disp: 30.6 Inhaler, Rfl: 3 .  predniSONE (DELTASONE) 10 MG tablet, Take 4 tabs po daily x 2 days; then 3 tabs for 2 days; then 2 tabs for 2 days; then 1 tab for 2 days (Patient not taking: Reported on 08/27/2018), Disp: 20 tablet, Rfl: 0 Medication Side Effects: none  Family Medical/ Social History: Changes? Yes Coronavirus pandemic   MENTAL HEALTH EXAM:  There were no vitals taken for this visit.There is no height or weight on file to calculate BMI.  General Appearance: Casual  Eye Contact:  Good  Speech:  Clear and Coherent  Volume:  Normal  Mood:  Euthymic  Affect:  Appropriate  Thought Process:  Goal Directed  Orientation:  Full (Time, Place, and Person)  Thought Content: Logical   Suicidal Thoughts:  No  Homicidal Thoughts:  No  Memory:  WNL  Judgement:  Good  Insight:  Good  Psychomotor Activity:  Normal  Concentration:  Concentration: Good  Recall:  Good  Fund of Knowledge: Good  Language: Good  Assets:  Desire for Improvement  ADL's:  Intact  Cognition: WNL  Prognosis:  Good    DIAGNOSES:    ICD-10-CM   1. Major depressive disorder, recurrent episode, moderate (HCC) F33.1   2. Generalized anxiety disorder F41.1   3. PMDD (premenstrual dysphoric disorder) F32.81     Receiving Psychotherapy: Yes Mertie ClauseLaura Stroud   RECOMMENDATIONS:  I am glad to see her doing so well! Continue Prozac 30 mg every morning.  She does not have to  increase to 40 mg daily the week before her.  But she can if she wants to. Continue Klonopin 0.5 mg, 1/2-1 p.o. twice daily as needed. Continue psychotherapy with Rocky LinkLara Stroud. Return in 4 months.  Melony Overlyeresa Rozlyn Yerby, PA-C   This record has been created using [employer] software.  Chart creation errors have been sought, but may not always have been located and corrected. Such creation errors do not reflect on the standard of medical care.

## 2018-09-02 DIAGNOSIS — F4322 Adjustment disorder with anxiety: Secondary | ICD-10-CM | POA: Diagnosis not present

## 2018-09-02 DIAGNOSIS — F4310 Post-traumatic stress disorder, unspecified: Secondary | ICD-10-CM | POA: Diagnosis not present

## 2018-09-02 DIAGNOSIS — F341 Dysthymic disorder: Secondary | ICD-10-CM | POA: Diagnosis not present

## 2018-09-09 DIAGNOSIS — F4322 Adjustment disorder with anxiety: Secondary | ICD-10-CM | POA: Diagnosis not present

## 2018-09-09 DIAGNOSIS — F4310 Post-traumatic stress disorder, unspecified: Secondary | ICD-10-CM | POA: Diagnosis not present

## 2018-09-09 DIAGNOSIS — F341 Dysthymic disorder: Secondary | ICD-10-CM | POA: Diagnosis not present

## 2018-09-13 ENCOUNTER — Other Ambulatory Visit: Payer: Self-pay | Admitting: Internal Medicine

## 2018-09-16 DIAGNOSIS — F341 Dysthymic disorder: Secondary | ICD-10-CM | POA: Diagnosis not present

## 2018-09-16 DIAGNOSIS — F4310 Post-traumatic stress disorder, unspecified: Secondary | ICD-10-CM | POA: Diagnosis not present

## 2018-09-16 DIAGNOSIS — F4322 Adjustment disorder with anxiety: Secondary | ICD-10-CM | POA: Diagnosis not present

## 2018-09-23 DIAGNOSIS — F4310 Post-traumatic stress disorder, unspecified: Secondary | ICD-10-CM | POA: Diagnosis not present

## 2018-09-23 DIAGNOSIS — F341 Dysthymic disorder: Secondary | ICD-10-CM | POA: Diagnosis not present

## 2018-09-23 DIAGNOSIS — F4322 Adjustment disorder with anxiety: Secondary | ICD-10-CM | POA: Diagnosis not present

## 2018-09-30 DIAGNOSIS — F4310 Post-traumatic stress disorder, unspecified: Secondary | ICD-10-CM | POA: Diagnosis not present

## 2018-09-30 DIAGNOSIS — F4322 Adjustment disorder with anxiety: Secondary | ICD-10-CM | POA: Diagnosis not present

## 2018-09-30 DIAGNOSIS — F341 Dysthymic disorder: Secondary | ICD-10-CM | POA: Diagnosis not present

## 2018-10-07 DIAGNOSIS — F341 Dysthymic disorder: Secondary | ICD-10-CM | POA: Diagnosis not present

## 2018-10-07 DIAGNOSIS — F4322 Adjustment disorder with anxiety: Secondary | ICD-10-CM | POA: Diagnosis not present

## 2018-10-07 DIAGNOSIS — F411 Generalized anxiety disorder: Secondary | ICD-10-CM | POA: Diagnosis not present

## 2018-10-14 DIAGNOSIS — F341 Dysthymic disorder: Secondary | ICD-10-CM | POA: Diagnosis not present

## 2018-10-14 DIAGNOSIS — F4322 Adjustment disorder with anxiety: Secondary | ICD-10-CM | POA: Diagnosis not present

## 2018-10-14 DIAGNOSIS — F411 Generalized anxiety disorder: Secondary | ICD-10-CM | POA: Diagnosis not present

## 2018-10-21 DIAGNOSIS — F411 Generalized anxiety disorder: Secondary | ICD-10-CM | POA: Diagnosis not present

## 2018-10-21 DIAGNOSIS — F4322 Adjustment disorder with anxiety: Secondary | ICD-10-CM | POA: Diagnosis not present

## 2018-10-21 DIAGNOSIS — F341 Dysthymic disorder: Secondary | ICD-10-CM | POA: Diagnosis not present

## 2018-10-28 DIAGNOSIS — F4322 Adjustment disorder with anxiety: Secondary | ICD-10-CM | POA: Diagnosis not present

## 2018-10-28 DIAGNOSIS — F411 Generalized anxiety disorder: Secondary | ICD-10-CM | POA: Diagnosis not present

## 2018-10-28 DIAGNOSIS — F341 Dysthymic disorder: Secondary | ICD-10-CM | POA: Diagnosis not present

## 2018-10-30 DIAGNOSIS — E782 Mixed hyperlipidemia: Secondary | ICD-10-CM | POA: Diagnosis not present

## 2018-10-30 DIAGNOSIS — F410 Panic disorder [episodic paroxysmal anxiety] without agoraphobia: Secondary | ICD-10-CM | POA: Diagnosis not present

## 2018-10-30 DIAGNOSIS — K219 Gastro-esophageal reflux disease without esophagitis: Secondary | ICD-10-CM | POA: Diagnosis not present

## 2018-10-30 DIAGNOSIS — J45998 Other asthma: Secondary | ICD-10-CM | POA: Diagnosis not present

## 2018-11-04 DIAGNOSIS — F4322 Adjustment disorder with anxiety: Secondary | ICD-10-CM | POA: Diagnosis not present

## 2018-11-04 DIAGNOSIS — F341 Dysthymic disorder: Secondary | ICD-10-CM | POA: Diagnosis not present

## 2018-11-04 DIAGNOSIS — F411 Generalized anxiety disorder: Secondary | ICD-10-CM | POA: Diagnosis not present

## 2018-11-04 DIAGNOSIS — F4310 Post-traumatic stress disorder, unspecified: Secondary | ICD-10-CM | POA: Diagnosis not present

## 2018-11-05 ENCOUNTER — Other Ambulatory Visit: Payer: Self-pay | Admitting: Physician Assistant

## 2018-11-11 DIAGNOSIS — F4310 Post-traumatic stress disorder, unspecified: Secondary | ICD-10-CM | POA: Diagnosis not present

## 2018-11-11 DIAGNOSIS — F411 Generalized anxiety disorder: Secondary | ICD-10-CM | POA: Diagnosis not present

## 2018-11-11 DIAGNOSIS — F341 Dysthymic disorder: Secondary | ICD-10-CM | POA: Diagnosis not present

## 2018-11-11 DIAGNOSIS — F4322 Adjustment disorder with anxiety: Secondary | ICD-10-CM | POA: Diagnosis not present

## 2018-11-18 DIAGNOSIS — F4322 Adjustment disorder with anxiety: Secondary | ICD-10-CM | POA: Diagnosis not present

## 2018-11-18 DIAGNOSIS — F341 Dysthymic disorder: Secondary | ICD-10-CM | POA: Diagnosis not present

## 2018-11-18 DIAGNOSIS — F411 Generalized anxiety disorder: Secondary | ICD-10-CM | POA: Diagnosis not present

## 2018-11-18 DIAGNOSIS — F4310 Post-traumatic stress disorder, unspecified: Secondary | ICD-10-CM | POA: Diagnosis not present

## 2018-11-20 DIAGNOSIS — F4322 Adjustment disorder with anxiety: Secondary | ICD-10-CM | POA: Diagnosis not present

## 2018-11-20 DIAGNOSIS — F4310 Post-traumatic stress disorder, unspecified: Secondary | ICD-10-CM | POA: Diagnosis not present

## 2018-11-20 DIAGNOSIS — F411 Generalized anxiety disorder: Secondary | ICD-10-CM | POA: Diagnosis not present

## 2018-11-20 DIAGNOSIS — F341 Dysthymic disorder: Secondary | ICD-10-CM | POA: Diagnosis not present

## 2018-12-01 ENCOUNTER — Ambulatory Visit: Payer: BLUE CROSS/BLUE SHIELD | Admitting: Internal Medicine

## 2018-12-02 DIAGNOSIS — F4310 Post-traumatic stress disorder, unspecified: Secondary | ICD-10-CM | POA: Diagnosis not present

## 2018-12-02 DIAGNOSIS — F341 Dysthymic disorder: Secondary | ICD-10-CM | POA: Diagnosis not present

## 2018-12-02 DIAGNOSIS — F411 Generalized anxiety disorder: Secondary | ICD-10-CM | POA: Diagnosis not present

## 2018-12-02 DIAGNOSIS — F4322 Adjustment disorder with anxiety: Secondary | ICD-10-CM | POA: Diagnosis not present

## 2018-12-09 DIAGNOSIS — F4322 Adjustment disorder with anxiety: Secondary | ICD-10-CM | POA: Diagnosis not present

## 2018-12-09 DIAGNOSIS — F411 Generalized anxiety disorder: Secondary | ICD-10-CM | POA: Diagnosis not present

## 2018-12-09 DIAGNOSIS — F4310 Post-traumatic stress disorder, unspecified: Secondary | ICD-10-CM | POA: Diagnosis not present

## 2018-12-09 DIAGNOSIS — F341 Dysthymic disorder: Secondary | ICD-10-CM | POA: Diagnosis not present

## 2018-12-16 DIAGNOSIS — F4310 Post-traumatic stress disorder, unspecified: Secondary | ICD-10-CM | POA: Diagnosis not present

## 2018-12-16 DIAGNOSIS — F411 Generalized anxiety disorder: Secondary | ICD-10-CM | POA: Diagnosis not present

## 2018-12-16 DIAGNOSIS — F4322 Adjustment disorder with anxiety: Secondary | ICD-10-CM | POA: Diagnosis not present

## 2018-12-16 DIAGNOSIS — F341 Dysthymic disorder: Secondary | ICD-10-CM | POA: Diagnosis not present

## 2018-12-23 DIAGNOSIS — F341 Dysthymic disorder: Secondary | ICD-10-CM | POA: Diagnosis not present

## 2018-12-23 DIAGNOSIS — F411 Generalized anxiety disorder: Secondary | ICD-10-CM | POA: Diagnosis not present

## 2018-12-23 DIAGNOSIS — F4310 Post-traumatic stress disorder, unspecified: Secondary | ICD-10-CM | POA: Diagnosis not present

## 2018-12-23 DIAGNOSIS — F4322 Adjustment disorder with anxiety: Secondary | ICD-10-CM | POA: Diagnosis not present

## 2018-12-29 ENCOUNTER — Ambulatory Visit: Payer: BC Managed Care – PPO | Admitting: Physician Assistant

## 2018-12-30 DIAGNOSIS — F411 Generalized anxiety disorder: Secondary | ICD-10-CM | POA: Diagnosis not present

## 2018-12-30 DIAGNOSIS — F341 Dysthymic disorder: Secondary | ICD-10-CM | POA: Diagnosis not present

## 2018-12-30 DIAGNOSIS — F4310 Post-traumatic stress disorder, unspecified: Secondary | ICD-10-CM | POA: Diagnosis not present

## 2019-01-04 DIAGNOSIS — M7542 Impingement syndrome of left shoulder: Secondary | ICD-10-CM | POA: Diagnosis not present

## 2019-01-04 DIAGNOSIS — M25512 Pain in left shoulder: Secondary | ICD-10-CM | POA: Diagnosis not present

## 2019-01-06 DIAGNOSIS — F4310 Post-traumatic stress disorder, unspecified: Secondary | ICD-10-CM | POA: Diagnosis not present

## 2019-01-06 DIAGNOSIS — F411 Generalized anxiety disorder: Secondary | ICD-10-CM | POA: Diagnosis not present

## 2019-01-06 DIAGNOSIS — F341 Dysthymic disorder: Secondary | ICD-10-CM | POA: Diagnosis not present

## 2019-01-12 IMAGING — DX DG SHOULDER 2+V*L*
3 series · 3 of 3 positions shown · non-contrast
Comparison: None.

CLINICAL DATA: Left shoulder pain without known injury.

EXAM:
LEFT SHOULDER - 2+ VIEW

[dg shoulder left (1 of 3)]
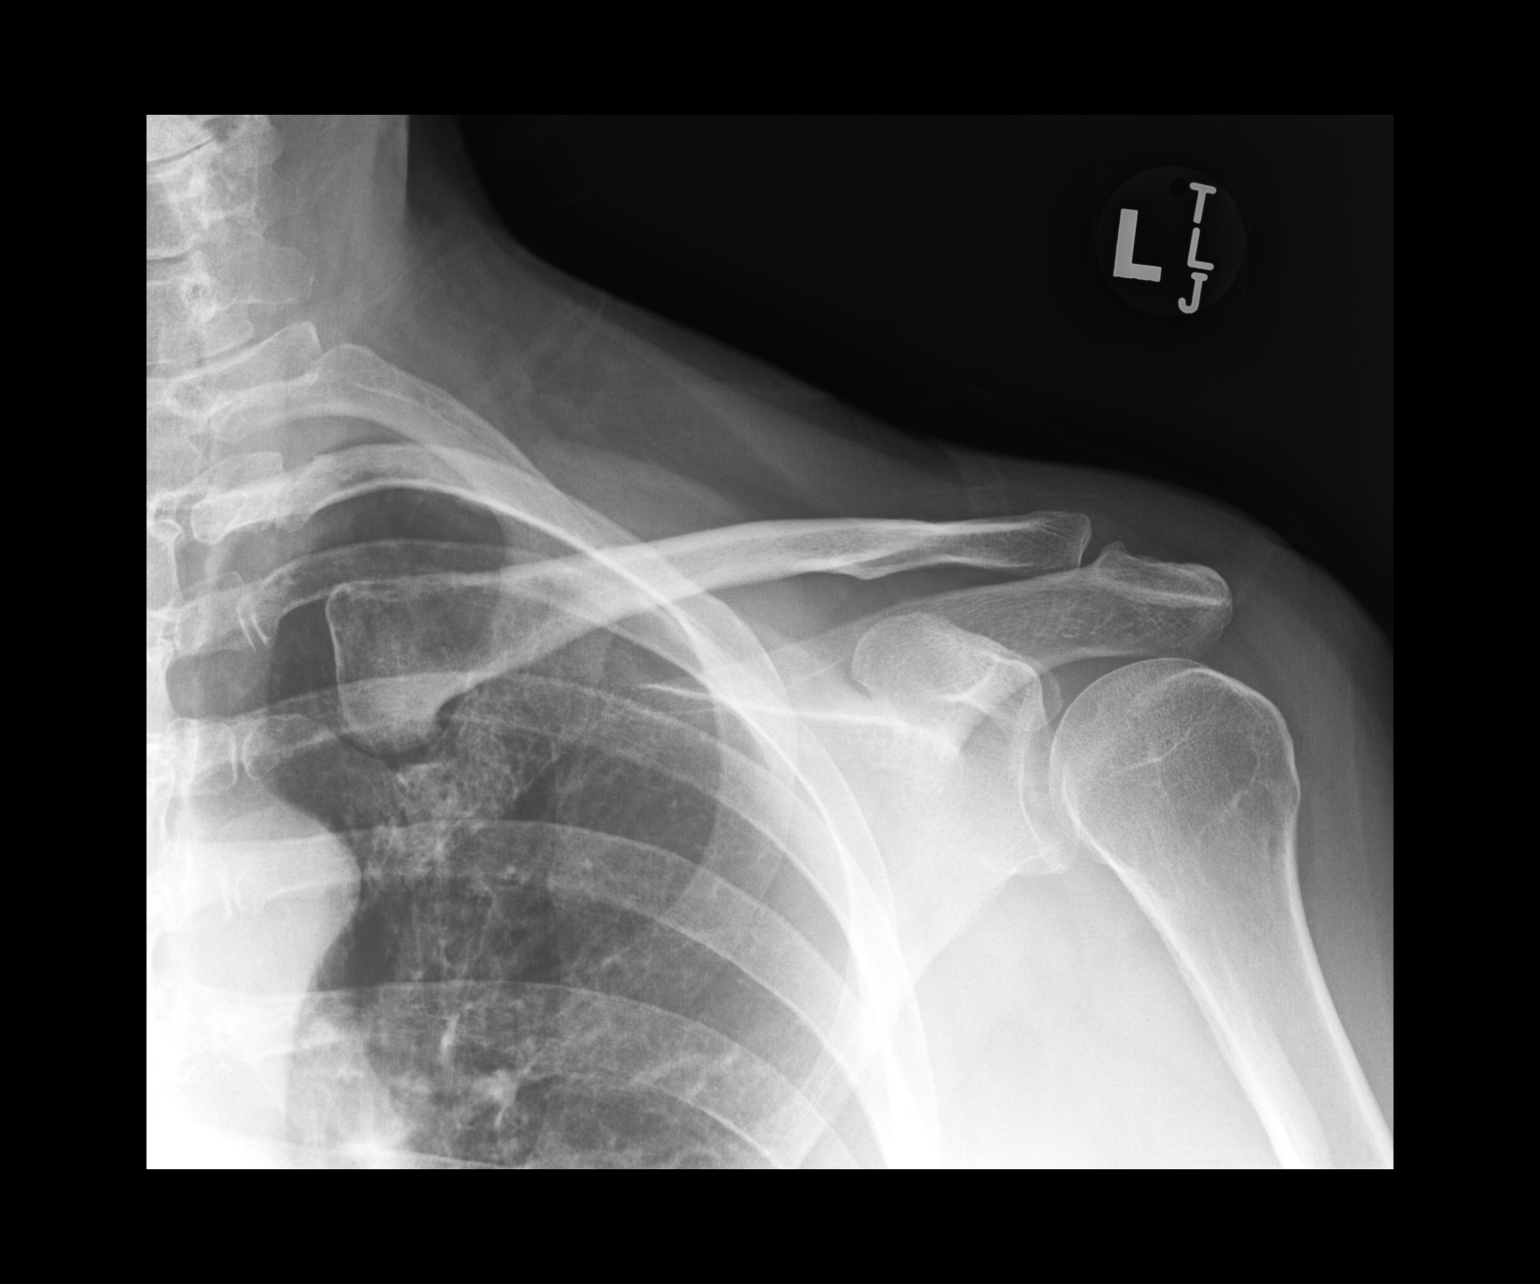

[dg shoulder left (2 of 3)]
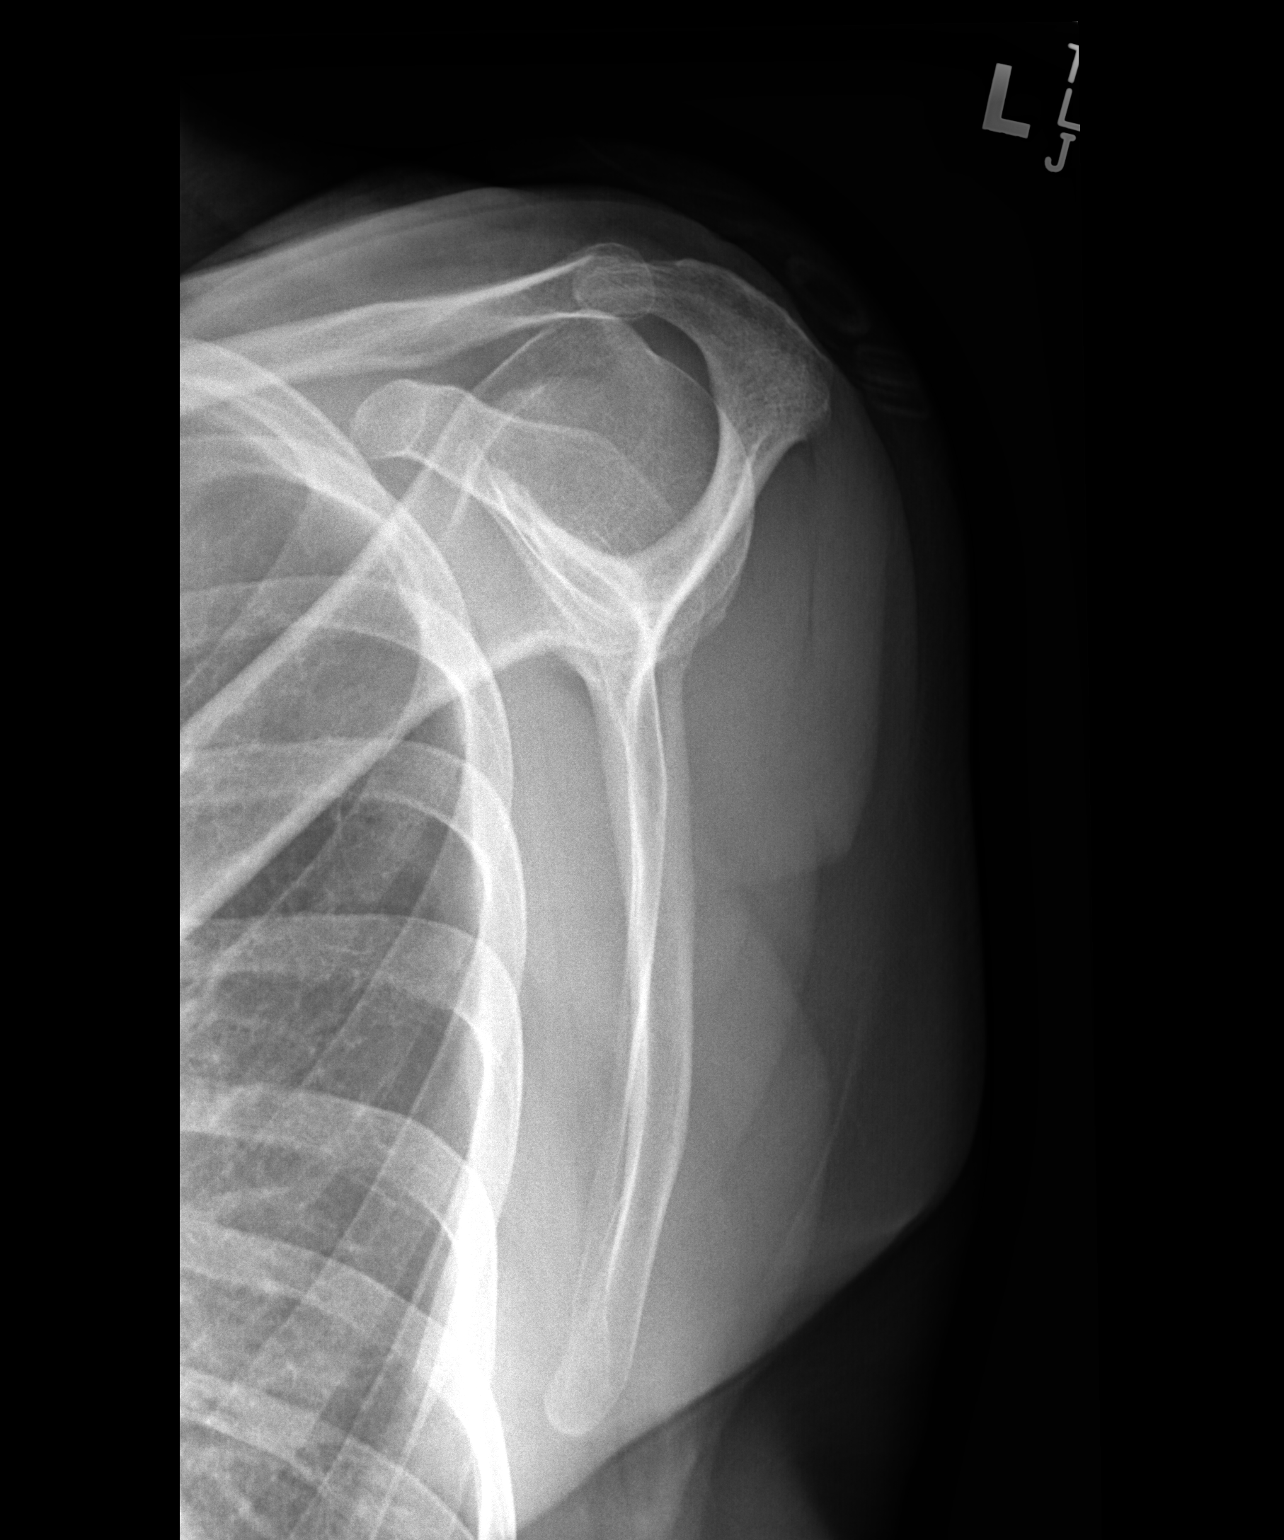

[dg shoulder left (3 of 3)]
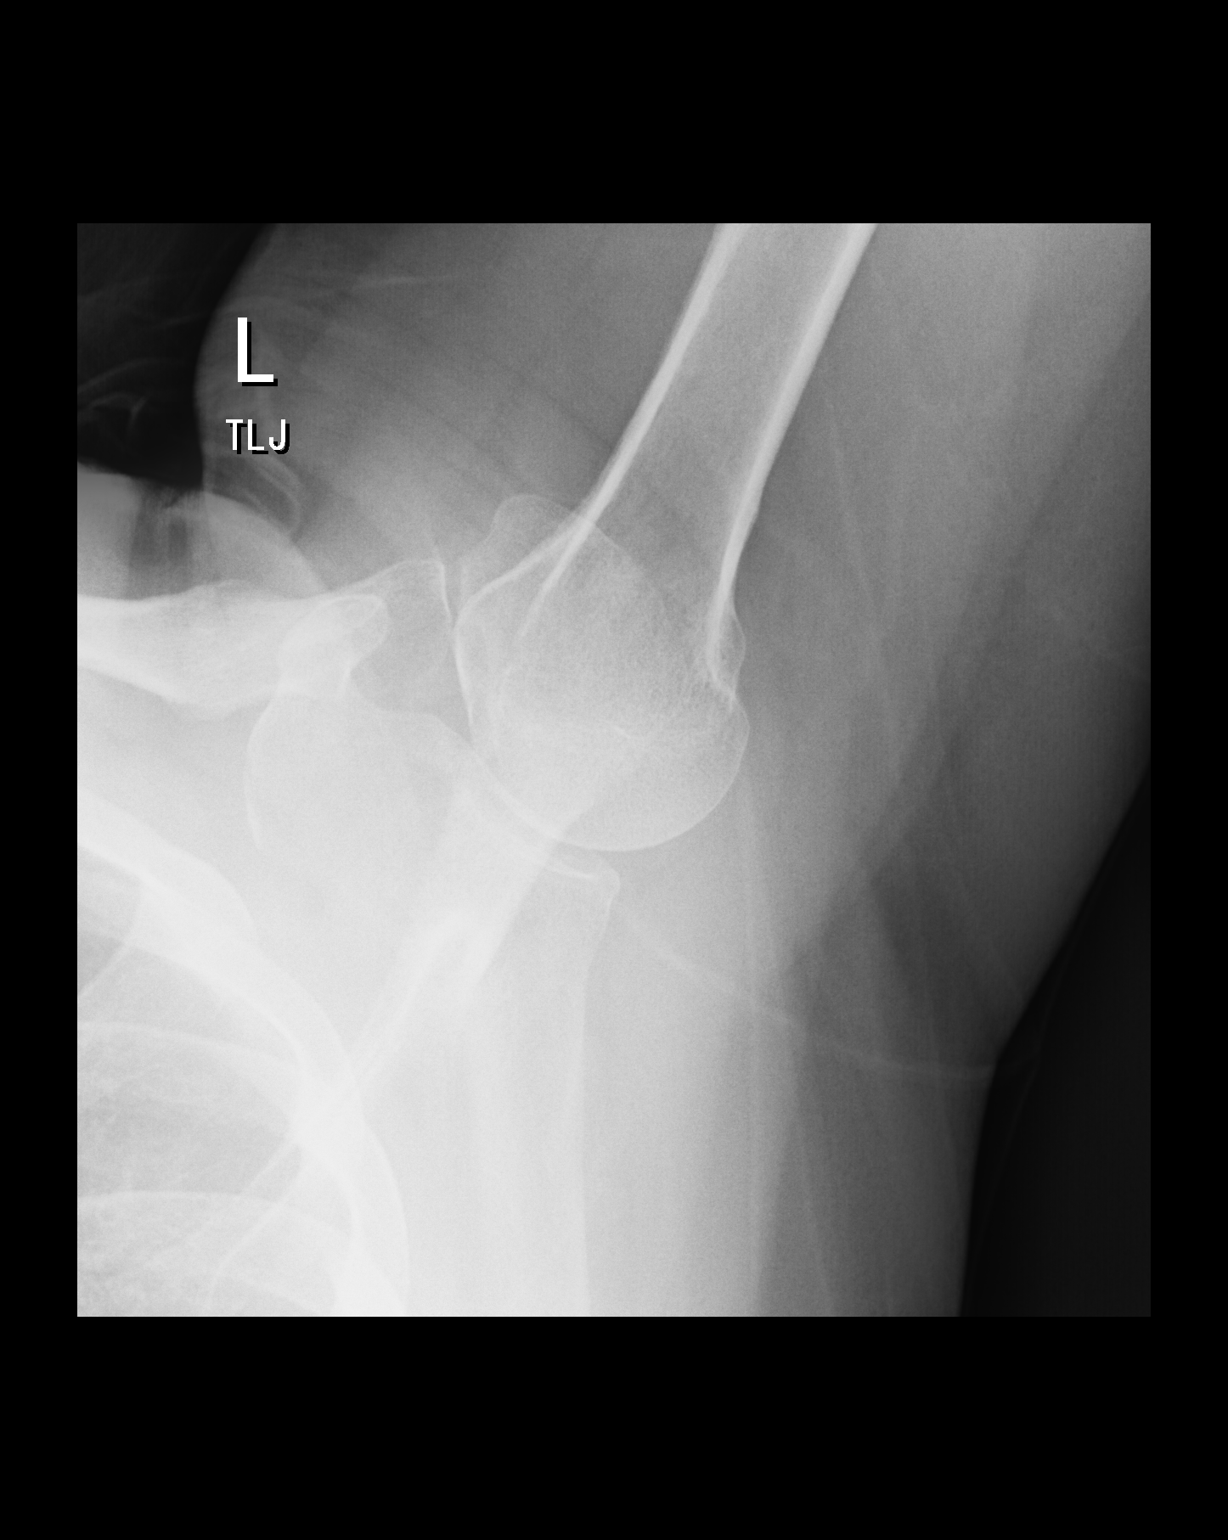

[3 of 3 positions shown; findings below may reference images not displayed]

FINDINGS: There is no evidence of fracture or dislocation. There is no
evidence of arthropathy or other focal bone abnormality. Soft
tissues are unremarkable.
IMPRESSION: Normal left shoulder.

## 2019-01-13 DIAGNOSIS — F4310 Post-traumatic stress disorder, unspecified: Secondary | ICD-10-CM | POA: Diagnosis not present

## 2019-01-13 DIAGNOSIS — F411 Generalized anxiety disorder: Secondary | ICD-10-CM | POA: Diagnosis not present

## 2019-01-13 DIAGNOSIS — F341 Dysthymic disorder: Secondary | ICD-10-CM | POA: Diagnosis not present

## 2019-01-13 DIAGNOSIS — M25512 Pain in left shoulder: Secondary | ICD-10-CM | POA: Diagnosis not present

## 2019-01-15 DIAGNOSIS — M25612 Stiffness of left shoulder, not elsewhere classified: Secondary | ICD-10-CM | POA: Diagnosis not present

## 2019-01-15 DIAGNOSIS — M6281 Muscle weakness (generalized): Secondary | ICD-10-CM | POA: Diagnosis not present

## 2019-01-15 DIAGNOSIS — M25512 Pain in left shoulder: Secondary | ICD-10-CM | POA: Diagnosis not present

## 2019-01-18 DIAGNOSIS — M6281 Muscle weakness (generalized): Secondary | ICD-10-CM | POA: Diagnosis not present

## 2019-01-18 DIAGNOSIS — M25512 Pain in left shoulder: Secondary | ICD-10-CM | POA: Diagnosis not present

## 2019-01-18 DIAGNOSIS — M25612 Stiffness of left shoulder, not elsewhere classified: Secondary | ICD-10-CM | POA: Diagnosis not present

## 2019-01-20 DIAGNOSIS — F411 Generalized anxiety disorder: Secondary | ICD-10-CM | POA: Diagnosis not present

## 2019-01-20 DIAGNOSIS — F341 Dysthymic disorder: Secondary | ICD-10-CM | POA: Diagnosis not present

## 2019-01-20 DIAGNOSIS — F4310 Post-traumatic stress disorder, unspecified: Secondary | ICD-10-CM | POA: Diagnosis not present

## 2019-01-21 DIAGNOSIS — M25512 Pain in left shoulder: Secondary | ICD-10-CM | POA: Diagnosis not present

## 2019-01-21 DIAGNOSIS — M25612 Stiffness of left shoulder, not elsewhere classified: Secondary | ICD-10-CM | POA: Diagnosis not present

## 2019-01-21 DIAGNOSIS — M6281 Muscle weakness (generalized): Secondary | ICD-10-CM | POA: Diagnosis not present

## 2019-01-22 DIAGNOSIS — M25512 Pain in left shoulder: Secondary | ICD-10-CM | POA: Diagnosis not present

## 2019-01-22 DIAGNOSIS — M7542 Impingement syndrome of left shoulder: Secondary | ICD-10-CM | POA: Diagnosis not present

## 2019-01-27 DIAGNOSIS — F341 Dysthymic disorder: Secondary | ICD-10-CM | POA: Diagnosis not present

## 2019-01-27 DIAGNOSIS — F4310 Post-traumatic stress disorder, unspecified: Secondary | ICD-10-CM | POA: Diagnosis not present

## 2019-01-27 DIAGNOSIS — F411 Generalized anxiety disorder: Secondary | ICD-10-CM | POA: Diagnosis not present

## 2019-01-29 ENCOUNTER — Other Ambulatory Visit: Payer: Self-pay | Admitting: Physician Assistant

## 2019-02-03 DIAGNOSIS — F341 Dysthymic disorder: Secondary | ICD-10-CM | POA: Diagnosis not present

## 2019-02-03 DIAGNOSIS — F4310 Post-traumatic stress disorder, unspecified: Secondary | ICD-10-CM | POA: Diagnosis not present

## 2019-02-03 DIAGNOSIS — F411 Generalized anxiety disorder: Secondary | ICD-10-CM | POA: Diagnosis not present

## 2019-02-05 DIAGNOSIS — F341 Dysthymic disorder: Secondary | ICD-10-CM | POA: Diagnosis not present

## 2019-02-05 DIAGNOSIS — F411 Generalized anxiety disorder: Secondary | ICD-10-CM | POA: Diagnosis not present

## 2019-02-05 DIAGNOSIS — F4310 Post-traumatic stress disorder, unspecified: Secondary | ICD-10-CM | POA: Diagnosis not present

## 2019-02-09 DIAGNOSIS — M19012 Primary osteoarthritis, left shoulder: Secondary | ICD-10-CM | POA: Diagnosis not present

## 2019-02-09 DIAGNOSIS — Y999 Unspecified external cause status: Secondary | ICD-10-CM | POA: Diagnosis not present

## 2019-02-09 DIAGNOSIS — S43432A Superior glenoid labrum lesion of left shoulder, initial encounter: Secondary | ICD-10-CM | POA: Diagnosis not present

## 2019-02-09 DIAGNOSIS — X58XXXA Exposure to other specified factors, initial encounter: Secondary | ICD-10-CM | POA: Diagnosis not present

## 2019-02-09 DIAGNOSIS — M7542 Impingement syndrome of left shoulder: Secondary | ICD-10-CM | POA: Diagnosis not present

## 2019-02-10 DIAGNOSIS — F4321 Adjustment disorder with depressed mood: Secondary | ICD-10-CM | POA: Diagnosis not present

## 2019-02-10 DIAGNOSIS — F411 Generalized anxiety disorder: Secondary | ICD-10-CM | POA: Diagnosis not present

## 2019-02-10 DIAGNOSIS — F341 Dysthymic disorder: Secondary | ICD-10-CM | POA: Diagnosis not present

## 2019-02-15 ENCOUNTER — Ambulatory Visit (INDEPENDENT_AMBULATORY_CARE_PROVIDER_SITE_OTHER): Payer: BC Managed Care – PPO | Admitting: Physician Assistant

## 2019-02-15 ENCOUNTER — Encounter: Payer: Self-pay | Admitting: Physician Assistant

## 2019-02-15 ENCOUNTER — Other Ambulatory Visit: Payer: Self-pay

## 2019-02-15 DIAGNOSIS — F331 Major depressive disorder, recurrent, moderate: Secondary | ICD-10-CM

## 2019-02-15 DIAGNOSIS — F411 Generalized anxiety disorder: Secondary | ICD-10-CM

## 2019-02-15 DIAGNOSIS — F3281 Premenstrual dysphoric disorder: Secondary | ICD-10-CM

## 2019-02-15 MED ORDER — CLONAZEPAM 1 MG PO TABS: 1.0000 mg | ORAL_TABLET | Freq: Two times a day (BID) | ORAL | 2 refills | Status: DC | PRN

## 2019-02-15 MED ORDER — FLUOXETINE HCL 20 MG PO CAPS: ORAL_CAPSULE | ORAL | 1 refills | Status: DC

## 2019-02-15 NOTE — Progress Notes (Signed)
[facility]  Patient ID: Sherry Adams,  MRN: [id number]  PCP: Vernie Shanks, MD  Date of Evaluation: 02/15/2019 Time spent:15 minutes  Chief Complaint:  Chief Complaint    Anxiety; Depression; Follow-up      HISTORY/CURRENT STATUS: HPI For 4 med check.  Is doing well!  States the Prozac at 30 mg daily is working really well.  She increases to 40 mg before menses.  Reports feeling so much better with the depression that week.  "It has been really helpful.  I am glad I decided to increase the Prozac that week.  I was hesitant at first but now I know how much better I feel by doing that." She is able to enjoy things.  Her energy and motivation levels are good.  She does not cry easily.  She is not isolating any more than is necessary due to the coronavirus pandemic.  Anxiety is well controlled with the Klonopin and the Prozac.  She does not often need the Klonopin but it is a nice "safety net" to have if she does not need it.  Denies dizziness, syncope, seizures, numbness, tingling, tremor, tics, unsteady gait, slurred speech, confusion. Denies muscle or joint pain, stiffness, or dystonia.  Individual Medical History/ Review of Systems: Changes? :Yes had left shoulder surgery last week.    Past medications for mental health diagnoses include: Zoloft, Effexor, Prozac, Klonopin  Allergies: Ibuprofen and Zyrtec [cetirizine]  Current Medications:  Current Outpatient Medications:  .  albuterol (ACCUNEB) 1.25 MG/3ML nebulizer solution, Take 3 mLs (1.25 mg total) by nebulization every 6 (six) hours as needed for wheezing., Disp: 75 mL, Rfl: 6 .  albuterol (PROAIR HFA) 108 (90 Base) MCG/ACT inhaler, Inhale 1 puff into the lungs every 6 (six) hours as needed for wheezing or shortness of breath., Disp: , Rfl:  .  FLUoxetine (PROZAC) 10 MG capsule, TAKE 3 CAPS IN AM, INCREASE TO 4 CAPS 5 DAY BEFORE MENSES. ONCE MENSES STARTS, BACK TO 3 CAPS DAILY, Disp: 270 capsule, Rfl: 0 .   montelukast (SINGULAIR) 10 MG tablet, Take 10 mg by mouth at bedtime., Disp: , Rfl:  .  omeprazole (PRILOSEC) 20 MG capsule, Take 20 mg by mouth daily. Qod sometimes., Disp: , Rfl:  .  SYMBICORT 160-4.5 MCG/ACT inhaler, TAKE 2 PUFFS BY MOUTH TWICE A DAY, Disp: 30.6 Inhaler, Rfl: 3 .  clonazePAM (KLONOPIN) 1 MG tablet, Take 1 tablet (1 mg total) by mouth 2 (two) times daily as needed for anxiety., Disp: 60 tablet, Rfl: 2 .  FLUoxetine (PROZAC) 20 MG capsule, Take 1 qd, with 10 mg=30mg , then increase to 2 po qd the week before menses., Disp: 180 capsule, Rfl: 1 .  predniSONE (DELTASONE) 10 MG tablet, Take 4 tabs po daily x 2 days; then 3 tabs for 2 days; then 2 tabs for 2 days; then 1 tab for 2 days (Patient not taking: Reported on 08/27/2018), Disp: 20 tablet, Rfl: 0 Medication Side Effects: none  Family Medical/ Social History: Changes? Yes Coronavirus pandemic, her aunt died last week, her Mom who lives with her is visiting her brother for a month which is a nice break.  MENTAL HEALTH EXAM:  There were no vitals taken for this visit.There is no height or weight on file to calculate BMI.  General Appearance: Casual, Neat and Well Groomed  Eye Contact:  Good  Speech:  Clear and Coherent  Volume:  Normal  Mood:  Euthymic  Affect:  Appropriate  Thought Process:  Goal  Directed and Descriptions of Associations: Intact  Orientation:  Full (Time, Place, and Person)  Thought Content: Logical   Suicidal Thoughts:  No  Homicidal Thoughts:  No  Memory:  WNL  Judgement:  Good  Insight:  Good  Psychomotor Activity:  Normal  Concentration:  Concentration: Good  Recall:  Good  Fund of Knowledge: Good  Language: Good  Assets:  Desire for Improvement  ADL's:  Intact  Cognition: WNL  Prognosis:  Good    DIAGNOSES:    ICD-10-CM   1. PMDD (premenstrual dysphoric disorder)  F32.81   2. Generalized anxiety disorder  F41.1   3. Major depressive disorder, recurrent episode, moderate (HCC)  F33.1      Receiving Psychotherapy: Yes Mertie Clause   RECOMMENDATIONS:  I am glad to see her doing so well! Continue Prozac 30 mg every morning. increase to 40 mg daily the week before her menses.   Continue Klonopin 1.0 mg, 1/2-1 p.o. twice daily as needed.  (I increased the dose to a 1 mg pill because she feels more comfortable having the higher dose.) Continue psychotherapy with Rocky Link. Return in 6 months.  Melony Overly, PA-C

## 2019-02-16 DIAGNOSIS — F4321 Adjustment disorder with depressed mood: Secondary | ICD-10-CM | POA: Diagnosis not present

## 2019-02-16 DIAGNOSIS — F411 Generalized anxiety disorder: Secondary | ICD-10-CM | POA: Diagnosis not present

## 2019-02-16 DIAGNOSIS — F341 Dysthymic disorder: Secondary | ICD-10-CM | POA: Diagnosis not present

## 2019-02-17 DIAGNOSIS — M25612 Stiffness of left shoulder, not elsewhere classified: Secondary | ICD-10-CM | POA: Diagnosis not present

## 2019-02-17 DIAGNOSIS — M6281 Muscle weakness (generalized): Secondary | ICD-10-CM | POA: Diagnosis not present

## 2019-02-17 DIAGNOSIS — M25512 Pain in left shoulder: Secondary | ICD-10-CM | POA: Diagnosis not present

## 2019-02-22 DIAGNOSIS — I9789 Other postprocedural complications and disorders of the circulatory system, not elsewhere classified: Secondary | ICD-10-CM | POA: Diagnosis not present

## 2019-02-22 DIAGNOSIS — M7542 Impingement syndrome of left shoulder: Secondary | ICD-10-CM | POA: Diagnosis not present

## 2019-02-22 DIAGNOSIS — Z4889 Encounter for other specified surgical aftercare: Secondary | ICD-10-CM | POA: Diagnosis not present

## 2019-02-22 DIAGNOSIS — M25512 Pain in left shoulder: Secondary | ICD-10-CM | POA: Diagnosis not present

## 2019-02-24 DIAGNOSIS — F341 Dysthymic disorder: Secondary | ICD-10-CM | POA: Diagnosis not present

## 2019-02-24 DIAGNOSIS — F411 Generalized anxiety disorder: Secondary | ICD-10-CM | POA: Diagnosis not present

## 2019-02-24 DIAGNOSIS — F4321 Adjustment disorder with depressed mood: Secondary | ICD-10-CM | POA: Diagnosis not present

## 2019-02-24 DIAGNOSIS — F4310 Post-traumatic stress disorder, unspecified: Secondary | ICD-10-CM | POA: Diagnosis not present

## 2019-03-03 DIAGNOSIS — F4321 Adjustment disorder with depressed mood: Secondary | ICD-10-CM | POA: Diagnosis not present

## 2019-03-03 DIAGNOSIS — M25612 Stiffness of left shoulder, not elsewhere classified: Secondary | ICD-10-CM | POA: Diagnosis not present

## 2019-03-03 DIAGNOSIS — F4310 Post-traumatic stress disorder, unspecified: Secondary | ICD-10-CM | POA: Diagnosis not present

## 2019-03-03 DIAGNOSIS — F411 Generalized anxiety disorder: Secondary | ICD-10-CM | POA: Diagnosis not present

## 2019-03-03 DIAGNOSIS — F341 Dysthymic disorder: Secondary | ICD-10-CM | POA: Diagnosis not present

## 2019-03-03 DIAGNOSIS — M25512 Pain in left shoulder: Secondary | ICD-10-CM | POA: Diagnosis not present

## 2019-03-03 DIAGNOSIS — M6281 Muscle weakness (generalized): Secondary | ICD-10-CM | POA: Diagnosis not present

## 2019-03-05 DIAGNOSIS — M25612 Stiffness of left shoulder, not elsewhere classified: Secondary | ICD-10-CM | POA: Diagnosis not present

## 2019-03-05 DIAGNOSIS — M6281 Muscle weakness (generalized): Secondary | ICD-10-CM | POA: Diagnosis not present

## 2019-03-05 DIAGNOSIS — M25512 Pain in left shoulder: Secondary | ICD-10-CM | POA: Diagnosis not present

## 2019-03-09 DIAGNOSIS — M25512 Pain in left shoulder: Secondary | ICD-10-CM | POA: Diagnosis not present

## 2019-03-09 DIAGNOSIS — I9789 Other postprocedural complications and disorders of the circulatory system, not elsewhere classified: Secondary | ICD-10-CM | POA: Diagnosis not present

## 2019-03-09 DIAGNOSIS — M25612 Stiffness of left shoulder, not elsewhere classified: Secondary | ICD-10-CM | POA: Diagnosis not present

## 2019-03-09 DIAGNOSIS — Z4889 Encounter for other specified surgical aftercare: Secondary | ICD-10-CM | POA: Diagnosis not present

## 2019-03-09 DIAGNOSIS — M6281 Muscle weakness (generalized): Secondary | ICD-10-CM | POA: Diagnosis not present

## 2019-03-09 DIAGNOSIS — M7542 Impingement syndrome of left shoulder: Secondary | ICD-10-CM | POA: Diagnosis not present

## 2019-03-10 DIAGNOSIS — F411 Generalized anxiety disorder: Secondary | ICD-10-CM | POA: Diagnosis not present

## 2019-03-10 DIAGNOSIS — F4321 Adjustment disorder with depressed mood: Secondary | ICD-10-CM | POA: Diagnosis not present

## 2019-03-10 DIAGNOSIS — F4310 Post-traumatic stress disorder, unspecified: Secondary | ICD-10-CM | POA: Diagnosis not present

## 2019-03-10 DIAGNOSIS — F341 Dysthymic disorder: Secondary | ICD-10-CM | POA: Diagnosis not present

## 2019-03-17 DIAGNOSIS — F411 Generalized anxiety disorder: Secondary | ICD-10-CM | POA: Diagnosis not present

## 2019-03-17 DIAGNOSIS — F4310 Post-traumatic stress disorder, unspecified: Secondary | ICD-10-CM | POA: Diagnosis not present

## 2019-03-17 DIAGNOSIS — F341 Dysthymic disorder: Secondary | ICD-10-CM | POA: Diagnosis not present

## 2019-03-17 DIAGNOSIS — F4321 Adjustment disorder with depressed mood: Secondary | ICD-10-CM | POA: Diagnosis not present

## 2019-03-18 DIAGNOSIS — M6281 Muscle weakness (generalized): Secondary | ICD-10-CM | POA: Diagnosis not present

## 2019-03-18 DIAGNOSIS — M25512 Pain in left shoulder: Secondary | ICD-10-CM | POA: Diagnosis not present

## 2019-03-18 DIAGNOSIS — M25612 Stiffness of left shoulder, not elsewhere classified: Secondary | ICD-10-CM | POA: Diagnosis not present

## 2019-03-24 DIAGNOSIS — F341 Dysthymic disorder: Secondary | ICD-10-CM | POA: Diagnosis not present

## 2019-03-24 DIAGNOSIS — F4310 Post-traumatic stress disorder, unspecified: Secondary | ICD-10-CM | POA: Diagnosis not present

## 2019-03-24 DIAGNOSIS — F4321 Adjustment disorder with depressed mood: Secondary | ICD-10-CM | POA: Diagnosis not present

## 2019-03-24 DIAGNOSIS — F411 Generalized anxiety disorder: Secondary | ICD-10-CM | POA: Diagnosis not present

## 2019-03-25 DIAGNOSIS — Z01411 Encounter for gynecological examination (general) (routine) with abnormal findings: Secondary | ICD-10-CM | POA: Diagnosis not present

## 2019-03-25 DIAGNOSIS — Z6828 Body mass index (BMI) 28.0-28.9, adult: Secondary | ICD-10-CM | POA: Diagnosis not present

## 2019-03-25 DIAGNOSIS — N644 Mastodynia: Secondary | ICD-10-CM | POA: Diagnosis not present

## 2019-03-31 DIAGNOSIS — F411 Generalized anxiety disorder: Secondary | ICD-10-CM | POA: Diagnosis not present

## 2019-03-31 DIAGNOSIS — F4321 Adjustment disorder with depressed mood: Secondary | ICD-10-CM | POA: Diagnosis not present

## 2019-03-31 DIAGNOSIS — F341 Dysthymic disorder: Secondary | ICD-10-CM | POA: Diagnosis not present

## 2019-03-31 DIAGNOSIS — F4310 Post-traumatic stress disorder, unspecified: Secondary | ICD-10-CM | POA: Diagnosis not present

## 2019-04-07 DIAGNOSIS — F4310 Post-traumatic stress disorder, unspecified: Secondary | ICD-10-CM | POA: Diagnosis not present

## 2019-04-07 DIAGNOSIS — F4321 Adjustment disorder with depressed mood: Secondary | ICD-10-CM | POA: Diagnosis not present

## 2019-04-07 DIAGNOSIS — F411 Generalized anxiety disorder: Secondary | ICD-10-CM | POA: Diagnosis not present

## 2019-04-07 DIAGNOSIS — F341 Dysthymic disorder: Secondary | ICD-10-CM | POA: Diagnosis not present

## 2019-04-09 DIAGNOSIS — Z1231 Encounter for screening mammogram for malignant neoplasm of breast: Secondary | ICD-10-CM | POA: Diagnosis not present

## 2019-04-14 DIAGNOSIS — F4321 Adjustment disorder with depressed mood: Secondary | ICD-10-CM | POA: Diagnosis not present

## 2019-04-14 DIAGNOSIS — F411 Generalized anxiety disorder: Secondary | ICD-10-CM | POA: Diagnosis not present

## 2019-04-14 DIAGNOSIS — F4310 Post-traumatic stress disorder, unspecified: Secondary | ICD-10-CM | POA: Diagnosis not present

## 2019-04-14 DIAGNOSIS — F341 Dysthymic disorder: Secondary | ICD-10-CM | POA: Diagnosis not present

## 2019-04-16 ENCOUNTER — Ambulatory Visit (INDEPENDENT_AMBULATORY_CARE_PROVIDER_SITE_OTHER): Payer: BC Managed Care – PPO | Admitting: Pulmonary Disease

## 2019-04-16 ENCOUNTER — Other Ambulatory Visit: Payer: Self-pay

## 2019-04-16 ENCOUNTER — Encounter: Payer: Self-pay | Admitting: Pulmonary Disease

## 2019-04-16 DIAGNOSIS — J4530 Mild persistent asthma, uncomplicated: Secondary | ICD-10-CM | POA: Diagnosis not present

## 2019-04-16 DIAGNOSIS — Z20822 Contact with and (suspected) exposure to covid-19: Secondary | ICD-10-CM

## 2019-04-16 DIAGNOSIS — Z20828 Contact with and (suspected) exposure to other viral communicable diseases: Secondary | ICD-10-CM | POA: Diagnosis not present

## 2019-04-16 MED ORDER — ALBUTEROL SULFATE 1.25 MG/3ML IN NEBU: 1.0000 | INHALATION_SOLUTION | Freq: Four times a day (QID) | RESPIRATORY_TRACT | 6 refills | Status: DC | PRN

## 2019-04-16 MED ORDER — PREDNISONE 10 MG PO TABS
ORAL_TABLET | ORAL | 0 refills | Status: DC
Start: 2019-04-16 — End: 2019-07-21

## 2019-04-16 NOTE — Patient Instructions (Addendum)
You were seen today by Coral CeoBrian P Tiana Sivertson, NP  for:   1. Mild persistent asthma without complication  - predniSONE (DELTASONE) 10 MG tablet; 4 tabs for 2 days, then 3 tabs for 2 days, 2 tabs for 2 days, then 1 tab for 2 days, then stop  Dispense: 20 tablet; Refill: 0 - albuterol (ACCUNEB) 1.25 MG/3ML nebulizer solution; Take 3 mLs (1.25 mg total) by nebulization every 6 (six) hours as needed for wheezing.  Dispense: 75 mL; Refill: 6  Continue Symbicort 160 >>> 2 puffs in the morning right when you wake up, rinse out your mouth after use, 12 hours later 2 puffs, rinse after use >>> Take this daily, no matter what >>> This is not a rescue inhaler    2. Close exposure to COVID-19 virus  Contact our office when you receive [facility]  If symptoms are worsening please contact our office we need to consider evaluation in the respiratory clinic next week  If you have sudden increased shortness of breath, chest pain or if you like you cannot breathe and you need to present to the emergency room or urgent care for further evaluation   We recommend today:   Meds ordered this encounter  Medications  . predniSONE (DELTASONE) 10 MG tablet    Sig: 4 tabs for 2 days, then 3 tabs for 2 days, 2 tabs for 2 days, then 1 tab for 2 days, then stop    Dispense:  20 tablet    Refill:  0  . albuterol (ACCUNEB) 1.25 MG/3ML nebulizer solution    Sig: Take 3 mLs (1.25 mg total) by nebulization every 6 (six) hours as needed for wheezing.    Dispense:  75 mL    Refill:  6    Follow Up:     Return in about 3 months (around 07/15/2019), or if symptoms worsen or fail to improve, for Follow up with Dr. Sherene SiresWert.   Please do your part to reduce the spread of COVID-19:      Reduce your risk of any infection  and COVID19 by using the similar precautions used for avoiding the common cold or flu:  Marland Kitchen. Wash your hands often with soap and warm water for at least 20 seconds.  If soap and water  are not readily available, use an alcohol-based hand sanitizer with at least 60% alcohol.  . If coughing or sneezing, cover your mouth and nose by coughing or sneezing into the elbow areas of your shirt or coat, into a tissue or into your sleeve (not your hands). Drinda Butts. WEAR A MASK when in public  . Avoid shaking hands with others and consider head nods or verbal greetings only. . Avoid touching your eyes, nose, or mouth with unwashed hands.  . Avoid close contact with people who are sick. . Avoid places or events with large numbers of people in one location, like concerts or sporting events. . If you have some symptoms but not all symptoms, continue to monitor at home and seek medical attention if your symptoms worsen. . If you are having a medical emergency, call 911.   ADDITIONAL HEALTHCARE OPTIONS FOR PATIENTS  [facility] / e-Visit: [email or web address]         [facility]: [phone]  [facility]: [phone]                   [facility]: [phone]     It  is flu season:   >>> Best ways to protect herself from the flu: Receive the yearly flu vaccine, practice good hand hygiene washing with soap and also using hand sanitizer when available, eat a nutritious meals, get adequate rest, hydrate appropriately   Please contact the office if your symptoms worsen or you have concerns that you are not improving.   Thank you for choosing [facility] for your healthcare, and for allowing [name] to partner with you on your healthcare journey. I am thankful to be able to provide care to you today.   Elisha Headland FNP-C    COVID-19 COVID-19 is a respiratory infection that is caused by a virus called severe acute respiratory syndrome coronavirus 2 (SARS-CoV-2). The disease is also known as coronavirus disease or novel coronavirus. In some people, the virus may not cause any symptoms. In others,  it may cause a serious infection. The infection can get worse quickly and can lead to complications, such as:  Pneumonia, or infection of the lungs.  Acute respiratory distress syndrome or ARDS. This is a condition in which fluid build-up in the lungs prevents the lungs from filling with air and passing oxygen into the blood.  Acute respiratory failure. This is a condition in which there is not enough oxygen passing from the lungs to the body or when carbon dioxide is not passing from the lungs out of the body.  Sepsis or septic shock. This is a serious bodily reaction to an infection.  Blood clotting problems.  Secondary infections due to bacteria or fungus.  Organ failure. This is when your body's organs stop working. The virus that causes COVID-19 is contagious. This means that it can spread from person to person through droplets from coughs and sneezes (respiratory secretions). What are the causes? This illness is caused by a virus. You may catch the virus by:  Breathing in droplets from an infected person. Droplets can be spread by a person breathing, speaking, singing, coughing, or sneezing.  Touching something, like a table or a doorknob, that was exposed to the virus (contaminated) and then touching your mouth, nose, or eyes. What increases the risk? Risk for infection You are more likely to be infected with this virus if you:  Are within 6 feet (2 meters) of a person with COVID-19.  Provide care for or live with a person who is infected with COVID-19.  Spend time in crowded indoor spaces or live in shared housing. Risk for serious illness You are more likely to become seriously ill from the virus if you:  Are 67 years of age or older. The higher your age, the more you are at risk for serious illness.  Live in a nursing home or long-term care facility.  Have cancer.  Have a long-term (chronic) disease such as: ? Chronic lung disease, including chronic obstructive  pulmonary disease or asthma. ? A long-term disease that lowers your body's ability to fight infection (immunocompromised). ? Heart disease, including heart failure, a condition in which the arteries that lead to the heart become narrow or blocked (coronary artery disease), a disease which makes the heart muscle thick, weak, or stiff (cardiomyopathy). ? Diabetes. ? Chronic kidney disease. ? Sickle cell disease, a condition in which red blood cells have an abnormal "sickle" shape. ? Liver disease.  Are obese. What are the signs or symptoms? Symptoms of this condition can range from mild to severe. Symptoms may appear any time from 2 to 14 days after being exposed  to the virus. They include:  A fever or chills.  A cough.  Difficulty breathing.  Headaches, body aches, or muscle aches.  Runny or stuffy (congested) nose.  A sore throat.  New loss of taste or smell. Some people may also have stomach problems, such as nausea, vomiting, or diarrhea. Other people may not have any symptoms of COVID-19. How is this diagnosed? This condition may be diagnosed based on:  Your signs and symptoms, especially if: ? You live in an area with a COVID-19 outbreak. ? You recently traveled to or from an area where the virus is common. ? You provide care for or live with a person who was diagnosed with COVID-19. ? You were exposed to a person who was diagnosed with COVID-19.  A physical exam.  Lab tests, which may include: ? Taking a sample of fluid from the back of your nose and throat (nasopharyngeal fluid), your nose, or your throat using a swab. ? A sample of mucus from your lungs (sputum). ? Blood tests.  Imaging tests, which may include, X-rays, CT scan, or ultrasound. How is this treated? At present, there is no medicine to treat COVID-19. Medicines that treat other diseases are being used on a trial basis to see if they are effective against COVID-19. Your health care provider will talk  with you about ways to treat your symptoms. For most people, the infection is mild and can be managed at home with rest, fluids, and over-the-counter medicines. Treatment for a serious infection usually takes places in a hospital intensive care unit (ICU). It may include one or more of the following treatments. These treatments are given until your symptoms improve.  Receiving fluids and medicines through an IV.  Supplemental oxygen. Extra oxygen is given through a tube in the nose, a face mask, or a hood.  Positioning you to lie on your stomach (prone position). This makes it easier for oxygen to get into the lungs.  Continuous positive airway pressure (CPAP) or bi-level positive airway pressure (BPAP) machine. This treatment uses mild air pressure to keep the airways open. A tube that is connected to a motor delivers oxygen to the body.  Ventilator. This treatment moves air into and out of the lungs by using a tube that is placed in your windpipe.  Tracheostomy. This is a procedure to create a hole in the neck so that a breathing tube can be inserted.  Extracorporeal membrane oxygenation (ECMO). This procedure gives the lungs a chance to recover by taking over the functions of the heart and lungs. It supplies oxygen to the body and removes carbon dioxide. Follow these instructions at home: Lifestyle  If you are sick, stay home except to get medical care. Your health care provider will tell you how long to stay home. Call your health care provider before you go for medical care.  Rest at home as told by your health care provider.  Do not use any products that contain nicotine or tobacco, such as cigarettes, e-cigarettes, and chewing tobacco. If you need help quitting, ask your health care provider.  Return to your normal activities as told by your health care provider. Ask your health care provider what activities are safe for you. General instructions  Take over-the-counter and  prescription medicines only as told by your health care provider.  Drink enough fluid to keep your urine pale yellow.  Keep all follow-up visits as told by your health care provider. This is important. How is this prevented?  There is no vaccine to help prevent COVID-19 infection. However, there are steps you can take to protect yourself and others from this virus. To protect yourself:   Do not travel to areas where COVID-19 is a risk. The areas where COVID-19 is reported change often. To identify high-risk areas and travel restrictions, check the CDC travel website: StageSync.si  If you live in, or must travel to, an area where COVID-19 is a risk, take precautions to avoid infection. ? Stay away from people who are sick. ? Wash your hands often with soap and water for 20 seconds. If soap and water are not available, use an alcohol-based hand sanitizer. ? Avoid touching your mouth, face, eyes, or nose. ? Avoid going out in public, follow guidance from your state and local health authorities. ? If you must go out in public, wear a cloth face covering or face mask. Make sure your mask covers your nose and mouth. ? Avoid crowded indoor spaces. Stay at least 6 feet (2 meters) away from others. ? Disinfect objects and surfaces that are frequently touched every day. This may include:  Counters and tables.  Doorknobs and light switches.  Sinks and faucets.  Electronics, such as phones, remote controls, keyboards, computers, and tablets. To protect others: If you have symptoms of COVID-19, take steps to prevent the virus from spreading to others.  If you think you have a COVID-19 infection, contact your health care provider right away. Tell your health care team that you think you may have a COVID-19 infection.  Stay home. Leave your house only to seek medical care. Do not use public transport.  Do not travel while you are sick.  Wash your hands often with soap and water  for 20 seconds. If soap and water are not available, use alcohol-based hand sanitizer.  Stay away from other members of your household. Let healthy household members care for children and pets, if possible. If you have to care for children or pets, wash your hands often and wear a mask. If possible, stay in your own room, separate from others. Use a different bathroom.  Make sure that all people in your household wash their hands well and often.  Cough or sneeze into a tissue or your sleeve or elbow. Do not cough or sneeze into your hand or into the air.  Wear a cloth face covering or face mask. Make sure your mask covers your nose and mouth. Where to find more information  Centers for Disease Control and Prevention: StickerEmporium.tn  World Health Organization: https://thompson-craig.com/ Contact a health care provider if:  You live in or have traveled to an area where COVID-19 is a risk and you have symptoms of the infection.  You have had contact with someone who has COVID-19 and you have symptoms of the infection. Get help right away if:  You have trouble breathing.  You have pain or pressure in your chest.  You have confusion.  You have bluish lips and fingernails.  You have difficulty waking from sleep.  You have symptoms that get worse. These symptoms may represent a serious problem that is an emergency. Do not wait to see if the symptoms will go away. Get medical help right away. Call your local emergency services (911 in the [place] Do not drive yourself to the hospital. Let the emergency medical personnel know if you think you have COVID-19. Summary  COVID-19 is a respiratory infection that is caused by a virus. It is also known as  coronavirus disease or novel coronavirus. It can cause serious infections, such as pneumonia, acute respiratory distress syndrome, acute respiratory failure, or sepsis.  The virus that causes COVID-19 is  contagious. This means that it can spread from person to person through droplets from breathing, speaking, singing, coughing, or sneezing.  You are more likely to develop a serious illness if you are 29 years of age or older, have a weak immune system, live in a nursing home, or have chronic disease.  There is no medicine to treat COVID-19. Your health care provider will talk with you about ways to treat your symptoms.  Take steps to protect yourself and others from infection. Wash your hands often and disinfect objects and surfaces that are frequently touched every day. Stay away from people who are sick and wear a mask if you are sick. This information is not intended to replace advice given to you by your health care provider. Make sure you discuss any questions you have with your health care provider. Document Revised: 01/08/2019 Document Reviewed: 04/16/2018 Elsevier Patient Education  [place].

## 2019-04-16 NOTE — Progress Notes (Signed)
Virtual Visit via Telephone Note  I connected with Sherry Adams on 04/16/19 at  3:00 PM EST by telephone and verified that I am speaking with the correct person using two identifiers.  Location: Patient: Home Provider: Office [facility] - [address] [place], [address], [place], [place] [address]   I discussed the limitations, risks, security and privacy concerns of performing an evaluation and management service by telephone and the availability of in person appointments. I also discussed with the patient that there may be a patient responsible charge related to this service. The patient expressed understanding and agreed to proceed.  Patient consented to consult via telephone: Yes People present and their role in pt care: Pt     History of Present Illness:  49 year old female never smoker followed in our office for asthma  Past medical history:Depression Smoking history: Never smoker Maintenance: Symbicort 160 Patient of Dr. Sherene Sires  Chief complaint: Shortness of breath  49 year old female followed in our office for asthma.  She was last evaluated by Dr. Sherene Sires in 2020.  Patient was last treated virtually in our office in March/2020 with a prednisone taper when she had a suspected asthma exacerbation where she thought she may have been exposed to Covid.  Patient contacted her office today on 04/16/2019 reporting that she had ongoing fevers, fatigue and shortness of breath.  Her daughter who she lives with is currently positive.  Daughter has a positive PCR test.  [facility].  She is awaiting those results.   Patient carries no diagnosis of diabetes, immunosuppression, chronic kidney disease.  Patient's BMI is below 35.  As of right now it does not appear that she would be a candidate for the monoclonal antibody infusion if in fact she is positive  Patient is not currently wheezing at this time.  Patient is concerned that she may  need to utilize urgent care or emergency room this weekend if she does not fact need a prednisone taper.  Observations/Objective:  Allergy profile 10/22/2016 >  Eos 0.3/  IgE 73  RAST pos dust > dog> cat   Tree> grass, min mold  - Spirometry 10/22/2016  wnl though mild curvature on budesonide neb  Social History   Tobacco Use  Smoking Status Never Smoker  Smokeless Tobacco Never Used   Immunization History  Administered Date(s) Administered  . Influenza Whole 12/22/2016  . Influenza,inj,Quad PF,6+ Mos 11/19/2016, 02/02/2018   Assessment and Plan:  Close exposure to COVID-19 virus Plan: We will await [facility] Patient to contact her office next week if symptoms are worsening or if asked if she does test positive for Covid May need to consider in person evaluation at the respiratory clinic if symptoms are worsening or if chest x-ray imaging is needed  Mild persistent asthma without complication Plan: Prednisone taper today >>> Discussed with patient at length that if she has to start using this she needs to contact her office.  Patient had a prednisone taper as we are heading into the weekend to help avoid urgent care use or emergency room use.  Patient to notify our office if she does in fact have to start taking prednisone.  Emphasized that the patient should only be using prednisone if wheezing persists despite albuterol nebulizer use  Continue Symbicort 160 Refilled albuterol nebulizer May need to consider evaluation of respiratory clinic next week if symptoms worsen  Follow Up Instructions:  Return in about 3 months (around 07/15/2019), or if symptoms worsen or fail to improve, for Follow up with Dr. Melvyn Novas.   I discussed the assessment and treatment plan with the patient. The patient was provided an opportunity to ask questions and all were answered. The patient agreed with the plan and demonstrated an understanding of the instructions.    The patient was advised to call back or seek an in-person evaluation if the symptoms worsen or if the condition fails to improve as anticipated.  I provided 24 minutes of non-face-to-face time during this encounter.   Lauraine Rinne, NP

## 2019-04-16 NOTE — Assessment & Plan Note (Signed)
Plan: Prednisone taper today >>> Discussed with patient at length that if she has to start using this she needs to contact her office.  Patient had a prednisone taper as we are heading into the weekend to help avoid urgent care use or emergency room use.  Patient to notify our office if she does in fact have to start taking prednisone.  Emphasized that the patient should only be using prednisone if wheezing persists despite albuterol nebulizer use  Continue Symbicort 160 Refilled albuterol nebulizer May need to consider evaluation of respiratory clinic next week if symptoms worsen

## 2019-04-16 NOTE — Assessment & Plan Note (Signed)
Plan: We will [facility] Patient to contact her office next week if symptoms are worsening or if asked if she does test positive for Covid May need to consider in person evaluation at the respiratory clinic if symptoms are worsening or if chest x-ray imaging is needed

## 2019-04-19 ENCOUNTER — Telehealth: Payer: Self-pay | Admitting: Pulmonary Disease

## 2019-04-19 DIAGNOSIS — U071 COVID-19: Secondary | ICD-10-CM

## 2019-04-19 NOTE — Telephone Encounter (Signed)
Attempted to call pt but unable to reach. Left message for pt to return call. 

## 2019-04-20 NOTE — Telephone Encounter (Signed)
Noted. Thank you.   Please also order:   [facility] Home Monitoring  Temperature monitoring   Associate to [phone]   If any symptoms worsen please contact our office or seen emergent eval.   Elisha Headland FNP

## 2019-04-20 NOTE — Telephone Encounter (Signed)
Spoke with the pt  She states that she got her covid test back from [facility] on 04/18/19 and it was positive  I sent her link to sign up for mychart and she will do this and send [name] a copy of result that way  She states she is overall not having many symptoms- some sinus pressure that comes and goes and mild SOB that responds well to nebs  She did not have to use the prednisone yet but has this on hand  She is not having any fever or CP  She states just wanted to keep Sherry Adams updated on what was going on with her

## 2019-04-20 NOTE — Telephone Encounter (Signed)
Called and spoke to pt. Informed her of the recs per Arlys John. Home monitoring and Mychart monitoring ordered. Pt aware her MyChart account is pending, she states she will activate. Pt verbalized understanding and denied any further questions or concerns at this time.

## 2019-04-21 DIAGNOSIS — F4310 Post-traumatic stress disorder, unspecified: Secondary | ICD-10-CM | POA: Diagnosis not present

## 2019-04-21 DIAGNOSIS — F341 Dysthymic disorder: Secondary | ICD-10-CM | POA: Diagnosis not present

## 2019-04-21 DIAGNOSIS — F4321 Adjustment disorder with depressed mood: Secondary | ICD-10-CM | POA: Diagnosis not present

## 2019-04-21 DIAGNOSIS — F411 Generalized anxiety disorder: Secondary | ICD-10-CM | POA: Diagnosis not present

## 2019-04-28 DIAGNOSIS — F4310 Post-traumatic stress disorder, unspecified: Secondary | ICD-10-CM | POA: Diagnosis not present

## 2019-04-28 DIAGNOSIS — F341 Dysthymic disorder: Secondary | ICD-10-CM | POA: Diagnosis not present

## 2019-04-28 DIAGNOSIS — F4321 Adjustment disorder with depressed mood: Secondary | ICD-10-CM | POA: Diagnosis not present

## 2019-04-28 DIAGNOSIS — F411 Generalized anxiety disorder: Secondary | ICD-10-CM | POA: Diagnosis not present

## 2019-04-29 ENCOUNTER — Other Ambulatory Visit: Payer: Self-pay | Admitting: Physician Assistant

## 2019-05-04 DIAGNOSIS — R928 Other abnormal and inconclusive findings on diagnostic imaging of breast: Secondary | ICD-10-CM | POA: Diagnosis not present

## 2019-05-05 DIAGNOSIS — F411 Generalized anxiety disorder: Secondary | ICD-10-CM | POA: Diagnosis not present

## 2019-05-05 DIAGNOSIS — F4321 Adjustment disorder with depressed mood: Secondary | ICD-10-CM | POA: Diagnosis not present

## 2019-05-05 DIAGNOSIS — F341 Dysthymic disorder: Secondary | ICD-10-CM | POA: Diagnosis not present

## 2019-05-05 DIAGNOSIS — F4310 Post-traumatic stress disorder, unspecified: Secondary | ICD-10-CM | POA: Diagnosis not present

## 2019-05-12 DIAGNOSIS — F4310 Post-traumatic stress disorder, unspecified: Secondary | ICD-10-CM | POA: Diagnosis not present

## 2019-05-12 DIAGNOSIS — F341 Dysthymic disorder: Secondary | ICD-10-CM | POA: Diagnosis not present

## 2019-05-12 DIAGNOSIS — F411 Generalized anxiety disorder: Secondary | ICD-10-CM | POA: Diagnosis not present

## 2019-05-12 DIAGNOSIS — F4323 Adjustment disorder with mixed anxiety and depressed mood: Secondary | ICD-10-CM | POA: Diagnosis not present

## 2019-05-19 DIAGNOSIS — F4323 Adjustment disorder with mixed anxiety and depressed mood: Secondary | ICD-10-CM | POA: Diagnosis not present

## 2019-05-19 DIAGNOSIS — F411 Generalized anxiety disorder: Secondary | ICD-10-CM | POA: Diagnosis not present

## 2019-05-19 DIAGNOSIS — F341 Dysthymic disorder: Secondary | ICD-10-CM | POA: Diagnosis not present

## 2019-05-19 DIAGNOSIS — F4310 Post-traumatic stress disorder, unspecified: Secondary | ICD-10-CM | POA: Diagnosis not present

## 2019-05-26 ENCOUNTER — Telehealth: Payer: Self-pay | Admitting: Pulmonary Disease

## 2019-05-26 DIAGNOSIS — F4321 Adjustment disorder with depressed mood: Secondary | ICD-10-CM | POA: Diagnosis not present

## 2019-05-26 DIAGNOSIS — F411 Generalized anxiety disorder: Secondary | ICD-10-CM | POA: Diagnosis not present

## 2019-05-26 DIAGNOSIS — F341 Dysthymic disorder: Secondary | ICD-10-CM | POA: Diagnosis not present

## 2019-05-26 DIAGNOSIS — F4310 Post-traumatic stress disorder, unspecified: Secondary | ICD-10-CM | POA: Diagnosis not present

## 2019-05-26 NOTE — Progress Notes (Signed)
Virtual Visit via Telephone Note  I connected with Sherry Adams on 05/27/19 at  9:00 AM EST by telephone and verified that I am speaking with the correct person using two identifiers.  Location: Patient: Home Provider: [facility] - [address] [place], [address], [place], [place] [address]   I discussed the limitations, risks, security and privacy concerns of performing an evaluation and management service by telephone and the availability of in person appointments. I also discussed with the patient that there may be a patient responsible charge related to this service. The patient expressed understanding and agreed to proceed.  Patient consented to consult via telephone: Yes People present and their role in pt care: Pt     History of Present Illness:  48 year old female never smoker followed in our office for asthma  Past medical history:Depression Smoking history: Never smoker Maintenance: Symbicort 160 Patient of Dr. Sherene Sires  Chief complaint: Acute - Cough / Fatigued  49 year old female never smoker followed in our office for asthma.  Patient is status post COVID-19 infection in late January/2021.  Patient did not require hospitalization.  Patient did not receive the monoclonal antibody infusion.  Patient is noted she has had increased dyspnea as well as fatigue status post Covid.  She has noticed increased wheezing over the last 2 to 3 weeks.  She has had to use her nebulizer about 3 times a day for the last 2 to 3 weeks.  She needs refills of these today.  She continues to be adherent to Symbicort 160.  She has not taken the prednisone course that she has on hand yet she wanted to discuss this with our office today.  Patient is reporting increased nasal drainage and congestion.  It is clear.  Denies fevers.  Continues to take Claritin and Singulair daily.   Patient is reporting that she is having some difficulty swallowing at times.  She has not followed up with primary  care about this. singulair daily   Observations/Objective:  Allergy profile 10/22/2016 >  Eos 0.3/  IgE 73  RAST pos dust > dog> cat   Tree> grass, min mold   Spirometry 10/22/2016  wnl though mild curvature on budesonide neb  04/18/19 - Sars COV2 - positive   Social History   Tobacco Use  Smoking Status Never Smoker  Smokeless Tobacco Never Used   Immunization History  Administered Date(s) Administered  . Influenza Whole 12/22/2016  . Influenza,inj,Quad PF,6+ Mos 11/19/2016, 02/02/2018    Assessment and Plan:  Mild persistent asthma without complication Likely asthma exacerbation status post Covid Increase clear nasal drainage  Plan: Prednisone taper today Continue Symbicort 160 Continue Claritin Continue Singulair Start nasal saline rinses twice daily   COVID-19 virus detected Plan: Close in person follow-up with our office May need to consider x-ray imaging as patient is status post Covid infection at next office visit Patient also would likely benefit from a walk   Follow Up Instructions:  Return in about 4 weeks (around 06/24/2019), or if symptoms worsen or fail to improve, for Follow up with Dr. Sherene Sires, With Chest Xray.   I discussed the assessment and treatment plan with the patient. The patient was provided an opportunity to ask questions and all were answered. The patient agreed with the plan and demonstrated an understanding of the instructions.   The patient was advised to call back or seek an in-person evaluation if the symptoms worsen or if the condition fails to improve as anticipated.  I provided  23 minutes of non-face-to-face time during this encounter.   Coral Ceo, NP

## 2019-05-26 NOTE — Telephone Encounter (Signed)
l °

## 2019-05-26 NOTE — Telephone Encounter (Signed)
Pt called back, but went ahead and scheduled patient for televisit w/Brian at 9 a.m. tomorrow.  [phone]

## 2019-05-27 ENCOUNTER — Encounter: Payer: Self-pay | Admitting: Pulmonary Disease

## 2019-05-27 ENCOUNTER — Telehealth: Payer: Self-pay | Admitting: Internal Medicine

## 2019-05-27 ENCOUNTER — Ambulatory Visit (INDEPENDENT_AMBULATORY_CARE_PROVIDER_SITE_OTHER): Payer: BC Managed Care – PPO | Admitting: Pulmonary Disease

## 2019-05-27 ENCOUNTER — Other Ambulatory Visit: Payer: Self-pay

## 2019-05-27 DIAGNOSIS — J4530 Mild persistent asthma, uncomplicated: Secondary | ICD-10-CM | POA: Diagnosis not present

## 2019-05-27 DIAGNOSIS — U071 COVID-19: Secondary | ICD-10-CM

## 2019-05-27 HISTORY — DX: COVID-19: U07.1

## 2019-05-27 MED ORDER — ALBUTEROL SULFATE 1.25 MG/3ML IN NEBU: 1.0000 | INHALATION_SOLUTION | Freq: Four times a day (QID) | RESPIRATORY_TRACT | 6 refills | Status: DC | PRN

## 2019-05-27 NOTE — Telephone Encounter (Signed)
Patient had a televisit with Sherry Adams this morning. He would like for the patient to follow up with MW in 4 weeks. She did not answer, I left a message for her to call us back.

## 2019-05-27 NOTE — Assessment & Plan Note (Signed)
Plan: Close in person follow-up with our office May need to consider x-ray imaging as patient is status post Covid infection at next office visit Patient also would likely benefit from a walk

## 2019-05-27 NOTE — Assessment & Plan Note (Signed)
Likely asthma exacerbation status post Covid Increase clear nasal drainage  Plan: Prednisone taper today Continue Symbicort 160 Continue Claritin Continue Singulair Start nasal saline rinses twice daily

## 2019-05-27 NOTE — Patient Instructions (Addendum)
You were seen today by Coral Ceo, NP  for:   It was nice talking with you today.  I think it is reasonable for you to go ahead and proceed forward with starting your prednisone taper as discussed.  Please continue Symbicort 160, Claritin and Singulair as already ordered.  Can continue to use your albuterol nebulized medication as needed for shortness of breath or wheezing.  Please contact our office if your symptoms or not improving.  I would like for you to start nasal saline rinses twice daily.  Please follow-up with primary care about your concerns regarding swallowing.  You likely will need an in person evaluation with primary care.  We will see you in office in 4 weeks with Dr. Sherene Sires.  Sherry Adams   1. Mild persistent asthma without complication  - albuterol (ACCUNEB) 1.25 MG/3ML nebulizer solution; Take 3 mLs (1.25 mg total) by nebulization every 6 (six) hours as needed for wheezing.  Dispense: 75 mL; Refill: 6  Prednisone 10mg  tablet  >>>4 tabs for 2 days, then 3 tabs for 2 days, 2 tabs for 2 days, then 1 tab for 2 days, then stop >>>take with food  >>>take in the morning   Continue Symbicort 160 >>> 2 puffs in the morning right when you wake up, rinse out your mouth after use, 12 hours later 2 puffs, rinse after use >>> Take this daily, no matter what >>> This is not a rescue inhaler   Continue daily Claritin  Continue daily Singulair  I would like for you to start nasal saline rinses twice daily for the next 2 weeks, after that can transition to 1 time daily  Please purchase nasal saline rinses over-the-counter Utilize distilled water Warm to lukewarm temperature like a baby bottle Shake well   2. COVID-19 virus detected  We will bring you into our office in about 4 weeks for physical in person evaluation  We may need to consider an x-ray at that point in time   We recommend today:   Meds ordered this encounter  Medications  . albuterol (ACCUNEB) 1.25 MG/3ML  nebulizer solution    Sig: Take 3 mLs (1.25 mg total) by nebulization every 6 (six) hours as needed for wheezing.    Dispense:  75 mL    Refill:  6    Follow Up:    Return in about 4 weeks (around 06/24/2019), or if symptoms worsen or fail to improve, for Follow up with Dr. 08/24/2019, With Chest Xray.   Please do your part to reduce the spread of COVID-19:      Reduce your risk of any infection  and COVID19 by using the similar precautions used for avoiding the common cold or flu:  Sherene Sires Wash your hands often with soap and warm water for at least 20 seconds.  If soap and water are not readily available, use an alcohol-based hand sanitizer with at least 60% alcohol.  . If coughing or sneezing, cover your mouth and nose by coughing or sneezing into the elbow areas of your shirt or coat, into a tissue or into your sleeve (not your hands). Marland Kitchen A MASK when in public  . Avoid shaking hands with others and consider head nods or verbal greetings only. . Avoid touching your eyes, nose, or mouth with unwashed hands.  . Avoid close contact with people who are sick. . Avoid places or events with large numbers of people in one location, like concerts or sporting events. . If you  have some symptoms but not all symptoms, continue to monitor at home and seek medical attention if your symptoms worsen. . If you are having a medical emergency, call 911.   [place] / e-Visit: [email or web address]         [facility]: [facility]: [phone]                   [facility]: [phone]     It is flu season:   >>> Best ways to protect herself from the flu: Receive the yearly flu vaccine, practice good hand hygiene washing with soap and also using hand sanitizer when available, eat a nutritious meals, get adequate rest, hydrate appropriately   Please  contact the office if your symptoms worsen or you have concerns that you are not improving.   Thank you for choosing [facility] for your healthcare, and for allowing [name] to partner with you on your healthcare journey. I am thankful to be able to provide care to you today.   Wyn Quaker FNP-C

## 2019-06-03 DIAGNOSIS — F341 Dysthymic disorder: Secondary | ICD-10-CM | POA: Diagnosis not present

## 2019-06-03 DIAGNOSIS — F411 Generalized anxiety disorder: Secondary | ICD-10-CM | POA: Diagnosis not present

## 2019-06-03 DIAGNOSIS — F4310 Post-traumatic stress disorder, unspecified: Secondary | ICD-10-CM | POA: Diagnosis not present

## 2019-06-03 DIAGNOSIS — F4321 Adjustment disorder with depressed mood: Secondary | ICD-10-CM | POA: Diagnosis not present

## 2019-06-09 DIAGNOSIS — F341 Dysthymic disorder: Secondary | ICD-10-CM | POA: Diagnosis not present

## 2019-06-09 DIAGNOSIS — F411 Generalized anxiety disorder: Secondary | ICD-10-CM | POA: Diagnosis not present

## 2019-06-09 DIAGNOSIS — F4323 Adjustment disorder with mixed anxiety and depressed mood: Secondary | ICD-10-CM | POA: Diagnosis not present

## 2019-06-09 DIAGNOSIS — F4310 Post-traumatic stress disorder, unspecified: Secondary | ICD-10-CM | POA: Diagnosis not present

## 2019-06-16 DIAGNOSIS — F341 Dysthymic disorder: Secondary | ICD-10-CM | POA: Diagnosis not present

## 2019-06-16 DIAGNOSIS — F4310 Post-traumatic stress disorder, unspecified: Secondary | ICD-10-CM | POA: Diagnosis not present

## 2019-06-16 DIAGNOSIS — F4323 Adjustment disorder with mixed anxiety and depressed mood: Secondary | ICD-10-CM | POA: Diagnosis not present

## 2019-06-16 DIAGNOSIS — F411 Generalized anxiety disorder: Secondary | ICD-10-CM | POA: Diagnosis not present

## 2019-06-21 ENCOUNTER — Encounter: Payer: Self-pay | Admitting: Pulmonary Disease

## 2019-06-21 ENCOUNTER — Ambulatory Visit (INDEPENDENT_AMBULATORY_CARE_PROVIDER_SITE_OTHER): Payer: BC Managed Care – PPO | Admitting: Pulmonary Disease

## 2019-06-21 ENCOUNTER — Other Ambulatory Visit: Payer: Self-pay

## 2019-06-21 ENCOUNTER — Telehealth: Payer: Self-pay | Admitting: Pulmonary Disease

## 2019-06-21 DIAGNOSIS — R1310 Dysphagia, unspecified: Secondary | ICD-10-CM | POA: Diagnosis not present

## 2019-06-21 DIAGNOSIS — J309 Allergic rhinitis, unspecified: Secondary | ICD-10-CM

## 2019-06-21 DIAGNOSIS — J4530 Mild persistent asthma, uncomplicated: Secondary | ICD-10-CM | POA: Diagnosis not present

## 2019-06-21 HISTORY — DX: Allergic rhinitis, unspecified: J30.9

## 2019-06-21 NOTE — Assessment & Plan Note (Signed)
Unable to fully evaluate during a telephone visit  Plan: Continue forward with following up with primary care for an in person evaluation to further evaluate and discuss the symptoms

## 2019-06-21 NOTE — Assessment & Plan Note (Signed)
Increase clear nasal drainage Known history of environmental allergies Clinical improvement noticed when taking Flonase   Plan: Continue Symbicort 160 Continue Claritin Continue Singulair Start nasal saline rinses twice daily Continue Flonase after nasal saline rinses

## 2019-06-21 NOTE — Telephone Encounter (Signed)
Pt needs virtual visit with APP or Pulmonologist.   Elisha Headland FNP

## 2019-06-21 NOTE — Patient Instructions (Addendum)
You were seen today by Coral Ceo, NP  for:   Is nice talking with you today over the phone.  I would like for you to start the nasal saline rinses that were recommended on 05/27/2019.  See the instructions listed below.  You can use your Flonase 1 spray each nostril after using the nasal saline rinse.  Continue your Claritin your Singulair and your Symbicort 160.  We need to limit prednisone use especially for allergy management that we can better manage with these interventions.  It is allergy season you have allergies to tree pollen which counts are quite high right now.  If you have fever, sputum color changes or worsening symptoms please contact our office we will get you set up to see Dr. Sherene Sires in about 4 weeks.  This will need to be in office visit.  As discussed today and as well on 05/27/2019 please schedule an appointment with your primary care to discuss your concerns about your swallowing.  Arlys John   1. Mild persistent asthma without complication  Continue Symbicort 160 >>> 2 puffs in the morning right when you wake up, rinse out your mouth after use, 12 hours later 2 puffs, rinse after use >>> Take this daily, no matter what >>> This is not a rescue inhaler   Continue daily Claritin  Continue daily Singulair  I would like for you to start nasal saline rinses twice daily for the next 2 weeks, after that can transition to 1 time daily  Please purchase nasal saline rinses over-the-counter Utilize distilled water Warm to lukewarm temperature like a baby bottle Shake well  Can use flonase AFTER nasal saline rinses   2. COVID-19 virus detected  We will bring you into our office in about 4 weeks for physical in person evaluation  We may need to consider an x-ray at that point in time   Follow Up:    Return in about 4 weeks (around 07/19/2019), or if symptoms worsen or fail to improve, for Follow up with Dr. Sherene Sires.   Please do your part to reduce the spread of  COVID-19:      Reduce your risk of any infection  and COVID19 by using the similar precautions used for avoiding the common cold or flu:  Marland Kitchen Wash your hands often with soap and warm water for at least 20 seconds.  If soap and water are not readily available, use an alcohol-based hand sanitizer with at least 60% alcohol.  . If coughing or sneezing, cover your mouth and nose by coughing or sneezing into the elbow areas of your shirt or coat, into a tissue or into your sleeve (not your hands). Drinda Butts A MASK when in public  . Avoid shaking hands with others and consider head nods or verbal greetings only. . Avoid touching your eyes, nose, or mouth with unwashed hands.  . Avoid close contact with people who are sick. . Avoid places or events with large numbers of people in one location, like concerts or sporting events. . If you have some symptoms but not all symptoms, continue to monitor at home and seek medical attention if your symptoms worsen. . If you are having a medical emergency, call 911.   ADDITIONAL HEALTHCARE OPTIONS FOR PATIENTS  [facility] / e-Visit: [email or web address]         [facility]: [phone]  [facility]: [phone]  [facility]: [phone]     It is flu season:   >>> Best ways to protect herself from the flu: Receive the yearly flu vaccine, practice good hand hygiene washing with soap and also using hand sanitizer when available, eat a nutritious meals, get adequate rest, hydrate appropriately   Please contact the office if your symptoms worsen or you have concerns that you are not improving.   Thank you for choosing [facility] for your healthcare, and for allowing [name] to partner with you on your healthcare journey. I am thankful to be able to provide care to you today.   Wyn Quaker FNP-C

## 2019-06-21 NOTE — Telephone Encounter (Signed)
televisit with Arlys John at 12 today.

## 2019-06-21 NOTE — Assessment & Plan Note (Signed)
Plan: Continue Claritin Continue Singulair Start nasal saline rinses twice daily Continue Flonase after nasal saline rinses

## 2019-06-21 NOTE — Progress Notes (Signed)
Virtual Visit via Telephone Note  I connected with Sherry Adams  on 06/21/19 at 12:00 PM EDT by telephone and verified that I am speaking with the correct person using two identifiers.  Location: Patient: Home Provider: Office [facility] - [address] [place], [address], [place], [place] [address]   I discussed the limitations, risks, security and privacy concerns of performing an evaluation and management service by telephone and the availability of in person appointments. I also discussed with the patient that there may be a patient responsible charge related to this service. The patient expressed understanding and agreed to proceed.  Patient consented to consult via telephone: Yes People present and their role in pt care: Pt   History of Present Illness:  49 year old female never smoker followed in our office for asthma  Past medical history:Depression Smoking history: Never smoker Maintenance: Symbicort 160 Patient of Dr. Sherene Sires  Chief complaint:    49 year old female never smoker contacted our office today to report that symptoms had returned after finishing prednisone taper.  Patient was last seen for a televisit in March/2021.  She is reporting that she is having symptoms of cough, shortness of breath, wheezing, nasal congestion and difficulty swallowing at times.  She is a cough with clear sputum.  Denies fever or body aches.  She states that she has not tried nasal saline rinses instead just using Flonase.  She still on Singulair, Claritin and Symbicort.  Patient contacted today via telephone to further evaluate symptoms.  Reviewed AVS from 05/27/2019 office visit.  Unfortunately the patient has not followed up with primary care regarding her swallowing as previously recommended and also has not started nasal saline rinses.  Patient reports that she did do 1 nasal saline rinse after speaking with our triage team today and did not notice much improvement.  We will discuss this  today.  Patient reports that she did not start nasal saline rinses because she started Flonase instead which she found at her mom's house and she did notice some improvement when using this.  Observations/Objective:  Allergy profile 10/22/2016 >  Eos 0.3/  IgE 73  RAST pos dust > dog> cat   Tree> grass, min mold   Spirometry 10/22/2016  wnl though mild curvature on budesonide neb  04/18/19 - Sars COV2 - positive   Social History   Tobacco Use  Smoking Status Never Smoker  Smokeless Tobacco Never Used   Immunization History  Administered Date(s) Administered  . Influenza Whole 12/22/2016  . Influenza,inj,Quad PF,6+ Mos 11/19/2016, 02/02/2018      Assessment and Plan:  Discussion: Explained to patient that we cannot continue to do rounds of prednisone for management of her symptoms.  She will need to have adequate management of her allergic rhinitis given the fact that she has known environmental allergies.  She will continue nasal saline rinses.  She can continue using Flonase.  If symptoms persist she can contact our office.  We will defer prednisone prescription at this time.  Patient needs to follow-up with primary care regarding trouble swallowing.  She can have a 4-week follow-up with our office with an in person evaluation with Dr. Sherene Sires.  Reviewed symptoms to monitor for as far as for pneumonia.  Patient denies having fevers or purulent sputum.  She does have fatigue.  Explained to her that this would be diagnosed by an in person evaluation/exam as well as also potentially an x-ray.  If she feels that the symptoms are worsening or she needs  an x-ray she can contact our office.  Mild persistent asthma without complication Increase clear nasal drainage Known history of environmental allergies Clinical improvement noticed when taking Flonase   Plan: Continue Symbicort 160 Continue Claritin Continue Singulair Start nasal saline rinses twice daily Continue Flonase after nasal  saline rinses   Allergic rhinitis Plan: Continue Claritin Continue Singulair Start nasal saline rinses twice daily Continue Flonase after nasal saline rinses  Trouble swallowing Unable to fully evaluate during a telephone visit  Plan: Continue forward with following up with primary care for an in person evaluation to further evaluate and discuss the symptoms   Follow Up Instructions:  Return in about 4 weeks (around 07/19/2019), or if symptoms worsen or fail to improve, for Follow up with Dr. Melvyn Novas.   I discussed the assessment and treatment plan with the patient. The patient was provided an opportunity to ask questions and all were answered. The patient agreed with the plan and demonstrated an understanding of the instructions.   The patient was advised to call back or seek an in-person evaluation if the symptoms worsen or if the condition fails to improve as anticipated.  I provided 24 minutes of non-face-to-face time during this encounter.   Lauraine Rinne, NP

## 2019-06-21 NOTE — Telephone Encounter (Signed)
I called and spoke with the pt. She states that she is still having symptoms of cough, SOB, wheezing, nasal congestion and  difficulty swallowing at times. She has some cough with clear sputum. Denies fever, body aches. Reports had chills 1 wk ago for a day.  She states while on pred taper from last visit these symptoms were much improved and then came back once med was done.  She is using her neb with albuterol 2-3 x per day and not using proair inhaler at all.  She states that she has not tried the saline rinses rec at last visit, and instead has been taking flonase instead b/c she thought that would be better.  She is still on the singulair, claritin, and symbicort  Requesting further recs, please advise, thanks!

## 2019-06-22 DIAGNOSIS — Z833 Family history of diabetes mellitus: Secondary | ICD-10-CM | POA: Diagnosis not present

## 2019-06-22 DIAGNOSIS — R5383 Other fatigue: Secondary | ICD-10-CM | POA: Diagnosis not present

## 2019-06-22 DIAGNOSIS — E782 Mixed hyperlipidemia: Secondary | ICD-10-CM | POA: Diagnosis not present

## 2019-06-22 DIAGNOSIS — J45998 Other asthma: Secondary | ICD-10-CM | POA: Diagnosis not present

## 2019-06-22 DIAGNOSIS — J309 Allergic rhinitis, unspecified: Secondary | ICD-10-CM | POA: Diagnosis not present

## 2019-06-23 DIAGNOSIS — F411 Generalized anxiety disorder: Secondary | ICD-10-CM | POA: Diagnosis not present

## 2019-06-23 DIAGNOSIS — F4310 Post-traumatic stress disorder, unspecified: Secondary | ICD-10-CM | POA: Diagnosis not present

## 2019-06-23 DIAGNOSIS — F341 Dysthymic disorder: Secondary | ICD-10-CM | POA: Diagnosis not present

## 2019-06-30 DIAGNOSIS — F4310 Post-traumatic stress disorder, unspecified: Secondary | ICD-10-CM | POA: Diagnosis not present

## 2019-06-30 DIAGNOSIS — L570 Actinic keratosis: Secondary | ICD-10-CM | POA: Diagnosis not present

## 2019-06-30 DIAGNOSIS — D2261 Melanocytic nevi of right upper limb, including shoulder: Secondary | ICD-10-CM | POA: Diagnosis not present

## 2019-06-30 DIAGNOSIS — D2262 Melanocytic nevi of left upper limb, including shoulder: Secondary | ICD-10-CM | POA: Diagnosis not present

## 2019-06-30 DIAGNOSIS — F411 Generalized anxiety disorder: Secondary | ICD-10-CM | POA: Diagnosis not present

## 2019-06-30 DIAGNOSIS — D225 Melanocytic nevi of trunk: Secondary | ICD-10-CM | POA: Diagnosis not present

## 2019-06-30 DIAGNOSIS — L918 Other hypertrophic disorders of the skin: Secondary | ICD-10-CM | POA: Diagnosis not present

## 2019-06-30 DIAGNOSIS — F341 Dysthymic disorder: Secondary | ICD-10-CM | POA: Diagnosis not present

## 2019-07-05 DIAGNOSIS — J309 Allergic rhinitis, unspecified: Secondary | ICD-10-CM | POA: Diagnosis not present

## 2019-07-05 DIAGNOSIS — R1314 Dysphagia, pharyngoesophageal phase: Secondary | ICD-10-CM | POA: Diagnosis not present

## 2019-07-05 DIAGNOSIS — R062 Wheezing: Secondary | ICD-10-CM | POA: Diagnosis not present

## 2019-07-05 DIAGNOSIS — J45998 Other asthma: Secondary | ICD-10-CM | POA: Diagnosis not present

## 2019-07-07 DIAGNOSIS — F341 Dysthymic disorder: Secondary | ICD-10-CM | POA: Diagnosis not present

## 2019-07-07 DIAGNOSIS — F411 Generalized anxiety disorder: Secondary | ICD-10-CM | POA: Diagnosis not present

## 2019-07-07 DIAGNOSIS — F4310 Post-traumatic stress disorder, unspecified: Secondary | ICD-10-CM | POA: Diagnosis not present

## 2019-07-13 ENCOUNTER — Other Ambulatory Visit: Payer: Self-pay | Admitting: Internal Medicine

## 2019-07-21 ENCOUNTER — Other Ambulatory Visit: Payer: Self-pay

## 2019-07-21 ENCOUNTER — Ambulatory Visit (INDEPENDENT_AMBULATORY_CARE_PROVIDER_SITE_OTHER): Payer: BC Managed Care – PPO | Admitting: Internal Medicine

## 2019-07-21 ENCOUNTER — Encounter: Payer: Self-pay | Admitting: Internal Medicine

## 2019-07-21 DIAGNOSIS — F341 Dysthymic disorder: Secondary | ICD-10-CM | POA: Diagnosis not present

## 2019-07-21 DIAGNOSIS — J4530 Mild persistent asthma, uncomplicated: Secondary | ICD-10-CM

## 2019-07-21 DIAGNOSIS — F4310 Post-traumatic stress disorder, unspecified: Secondary | ICD-10-CM | POA: Diagnosis not present

## 2019-07-21 DIAGNOSIS — Z8616 Personal history of COVID-19: Secondary | ICD-10-CM

## 2019-07-21 DIAGNOSIS — F411 Generalized anxiety disorder: Secondary | ICD-10-CM | POA: Diagnosis not present

## 2019-07-21 NOTE — Patient Instructions (Addendum)
I would recommend you see Dr Kathyrn Lass group if you are not  100% satisfied with control of your respiratory symptoms after you complete the GI and ENT work up planned  Pulmonary follow up is as needed

## 2019-07-21 NOTE — Assessment & Plan Note (Signed)
Onset in Childhood Allergy profile 10/22/2016 >  Eos 0.3/  IgE 73  RAST pos dust > dog> cat   Tree> grass, min mold  - Spirometry 10/22/2016  wnl though mild curvature on budesonide neb  s saba prior - 10/22/2016   try symb 160 2bid - 05/29/2018  After extensive coaching inhaler device,  effectiveness =    90%   All goals of chronic asthma control met including optimal function and elimination of symptoms with minimal need for rescue therapy.  Contingencies discussed in full including contacting this office immediately if not controlling the symptoms using the rule of two's.     If still having resp symptoms p complete the gi/ent w/u >>>  Allergy eval next/ pulmonary f/u is prn   Each maintenance medication was reviewed in detail including most importantly the difference between maintenance and as needed and under what circumstances the prns are to be used.  Please see AVS for specific  Instructions which are unique to this visit and I personally typed out  which were reviewed in detail over the phone with the patient and a copy provided per MyChart

## 2019-07-21 NOTE — Progress Notes (Signed)
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  lots of outdoor allergies on shots / theophylline never able to stop it or tried on  other maint rx, missed lots of school and saw pulmonary doctor helped a lot age 8 when taught how to use inhaler and not a lot better then  changed from [place] to [place] after last daughter born 52 and generally better since then moved to [place] around 2014 had only one bad episode of flare related to gerd better on rx for gerd and maintained on budesonide bid and reflux meds plus saba avg nightly  And referred to pulmonary clinic 10/22/2016 by Dr  Jacelyn Grip    History of Present Illness  10/22/2016 1st [facility] office visit/ Sherry Adams   Chief Complaint  Patient presents with  . Pulmonary Consult    Self referral. She states she was dxed with Asthma age 49. She states had illness 2 months ago that made her symptoms flare.  Today she reports she is back at her normal baseline, but wanted to est care for Asthma. She uses proair 2 puffs every night.   back to baseline = avg saba once every  hs along with bud neb bid  Afraid to ex due to "allergies"  / does yoga ok but no indoor aerobics Cough is no  longer an issue/ maint on prilosec 20 mg each pm  rec Prilosec 20 mg  Take 30-60 min before first meal of the day  GERD  Diet   Plan A = Automatic = Symbicort 160 Take 2 puffs first thing in am and then another 2 puffs about 12 hours later.  Work on inhaler technique:  Plan B = Backup Only use your albuterol as a rescue medication  Plan C = Crisis - only use your albuterol nebulizer if you first try Plan B and it fails to help > ok to use the nebulizer up to every 4 hours but if start needing it regularly call for immediate appointment    01/22/2017  f/u ov/Sherry Adams re: mild chronic asthma Chief Complaint  Patient  presents with  . Follow-up    Breathing is doing well. She is using her albuterol inhaler 2 x per wk on average. She rarely uses neb.    still not using symbicort 160 perfectly regularly 2bid or effectively  and   using allegra as maint rx not as prn but overall much better since started symb 160  Has not tried ex yet "afraid of allergies/ wheezing" and wonders if/why still needs saba prior to ex   Noct fine rec Plan A = Automatic = Symbicort 160 Take 2 puffs first thing in am and then another 2 puffs about 12 hours later.  Work on inhaler technique:  Plan B = Backup Only use your albuterol  Plan C = Crisis - only use your albuterol nebulizer if you first try Plan B             11/18/2017  f/u ov/Sherry Adams re: mild chronic asthma - worse on symb 160 one twice daily so back to 2bid Chief Complaint  Patient presents with  . Follow-up    Breathing is doing well. She is using her albuterol on average once per month.    Dyspnea:  Ex bike up  an hour qod s problems Cough: no Sleeping: 30 degrees due to gerd SABA use: once a month  02: none   rec No change rx   05/29/2018  f/u ov/Sherry Adams re: mild chronic asthma/ still has dog in bedroom  On symb 160/singulair  Chief Complaint  Patient presents with  . Follow-up    Breathing is doing well. She rarely uses her proair.    Dyspnea:  Not limited by breathing from desired activities  / some aerobics Cough: no Sleeping: 30 degrees SABA use: rarely  02: none  rec No change  rx   04/18/2019-SARS-CoV-2-positive ([facility] rapid test) Did not require hospitalization, did not receive monoclonal antibody infusion   Virtual Visit via Telephone Note 07/21/2019   I connected with Sherry Adams on 07/21/19 at 10:45 AM EDT by telephone and verified that I am speaking with the correct person using two identifiers.   I discussed the limitations, risks, security and privacy concerns of performing an evaluation and management service by telephone and the  availability of in person appointments. I also discussed with the patient that there may be a patient responsible charge related to this service. The patient expressed understanding and agreed to proceed.   History of Present Illness: Doing great p last flare though says her pcp heard wheezing in her neck and wants her seen by ENT and GI next. Not needing any saba rx.  Came thru  covid 19 infection well and planning Moderna May 2    No obvious day to day or daytime variability or assoc excess/ purulent sputum or mucus plugs or hemoptysis or cp or chest tightness, or overt sinus or hb symptoms.    Also denies any obvious fluctuation of symptoms with weather or environmental changes or other aggravating or alleviating factors except as outlined above.   Meds reviewed/ med reconciliation completed          Observations/Objective: Very upbeat, good voice texture, no conversational sob or spont cough during interview   Assessment and Plan: See problem list for active a/p's   Follow Up Instructions: See avs for instructions unique to this ov which includes revised/ updated med list     I discussed the assessment and treatment plan with the patient. The patient was provided an opportunity to ask questions and all were answered. The patient agreed with the plan and demonstrated an understanding of the instructions.   The patient was advised to call back or seek an in-person evaluation if the symptoms worsen or if the condition fails to improve as anticipated.  I provided 12 minutes of non-face-to-face time during this encounter.   Sandrea Hughs, MD

## 2019-07-27 ENCOUNTER — Other Ambulatory Visit: Payer: Self-pay | Admitting: Physician Assistant

## 2019-07-27 DIAGNOSIS — J45909 Unspecified asthma, uncomplicated: Secondary | ICD-10-CM | POA: Diagnosis not present

## 2019-07-27 DIAGNOSIS — Z8616 Personal history of COVID-19: Secondary | ICD-10-CM | POA: Diagnosis not present

## 2019-07-27 DIAGNOSIS — K219 Gastro-esophageal reflux disease without esophagitis: Secondary | ICD-10-CM | POA: Diagnosis not present

## 2019-07-28 DIAGNOSIS — F4310 Post-traumatic stress disorder, unspecified: Secondary | ICD-10-CM | POA: Diagnosis not present

## 2019-07-28 DIAGNOSIS — F341 Dysthymic disorder: Secondary | ICD-10-CM | POA: Diagnosis not present

## 2019-07-28 DIAGNOSIS — F411 Generalized anxiety disorder: Secondary | ICD-10-CM | POA: Diagnosis not present

## 2019-08-04 DIAGNOSIS — F4310 Post-traumatic stress disorder, unspecified: Secondary | ICD-10-CM | POA: Diagnosis not present

## 2019-08-04 DIAGNOSIS — F341 Dysthymic disorder: Secondary | ICD-10-CM | POA: Diagnosis not present

## 2019-08-04 DIAGNOSIS — F411 Generalized anxiety disorder: Secondary | ICD-10-CM | POA: Diagnosis not present

## 2019-08-11 DIAGNOSIS — F4310 Post-traumatic stress disorder, unspecified: Secondary | ICD-10-CM | POA: Diagnosis not present

## 2019-08-11 DIAGNOSIS — F411 Generalized anxiety disorder: Secondary | ICD-10-CM | POA: Diagnosis not present

## 2019-08-11 DIAGNOSIS — F341 Dysthymic disorder: Secondary | ICD-10-CM | POA: Diagnosis not present

## 2019-08-13 ENCOUNTER — Other Ambulatory Visit: Payer: Self-pay | Admitting: Physician Assistant

## 2019-08-16 ENCOUNTER — Ambulatory Visit (INDEPENDENT_AMBULATORY_CARE_PROVIDER_SITE_OTHER): Payer: BC Managed Care – PPO | Admitting: Physician Assistant

## 2019-08-16 ENCOUNTER — Encounter: Payer: Self-pay | Admitting: Physician Assistant

## 2019-08-16 ENCOUNTER — Other Ambulatory Visit: Payer: Self-pay

## 2019-08-16 DIAGNOSIS — F3281 Premenstrual dysphoric disorder: Secondary | ICD-10-CM | POA: Diagnosis not present

## 2019-08-16 DIAGNOSIS — F411 Generalized anxiety disorder: Secondary | ICD-10-CM | POA: Diagnosis not present

## 2019-08-16 DIAGNOSIS — F3341 Major depressive disorder, recurrent, in partial remission: Secondary | ICD-10-CM

## 2019-08-16 MED ORDER — CLONAZEPAM 1 MG PO TABS: 1.0000 mg | ORAL_TABLET | Freq: Two times a day (BID) | ORAL | 2 refills | Status: DC | PRN

## 2019-08-16 NOTE — Progress Notes (Signed)
[facility] Med Check  Patient ID: Sherry Adams,  MRN: [id number]  PCP: Vernie Shanks, MD  Date of Evaluation: 08/17/2019 Time spent:20 minutes  Chief Complaint:  Chief Complaint    Follow-up      HISTORY/CURRENT STATUS: HPI For 6 med check.  Doing well.  She is able to enjoy things, energy and motivation are good, does not cry easily, not isolating, denies SI/HI.  Continues to increase the Prozac the week before her menses and that is still helpful. Anxiety is well controlled with the Klonopin and the use of coping techniques that she has learned in therapy.  Denies dizziness, syncope, seizures, numbness, tingling, tremor, tics, unsteady gait, slurred speech, confusion. Denies muscle or joint pain, stiffness, or dystonia.  Individual Medical History/ Review of Systems: Changes? :No    Past medications for mental health diagnoses include: Zoloft, Effexor, Prozac, Klonopin  Allergies: Ibuprofen and Zyrtec [cetirizine]  Current Medications:  Current Outpatient Medications:  .  albuterol (ACCUNEB) 1.25 MG/3ML nebulizer solution, Take 3 mLs (1.25 mg total) by nebulization every 6 (six) hours as needed for wheezing., Disp: 75 mL, Rfl: 6 .  albuterol (PROAIR HFA) 108 (90 Base) MCG/ACT inhaler, Inhale 1 puff into the lungs every 6 (six) hours as needed for wheezing or shortness of breath., Disp: , Rfl:  .  budesonide-formoterol (SYMBICORT) 160-4.5 MCG/ACT inhaler, TAKE 2 PUFFS BY MOUTH TWICE A DAY, Disp: 30.6 Inhaler, Rfl: 3 .  clonazePAM (KLONOPIN) 1 MG tablet, Take 1 tablet (1 mg total) by mouth 2 (two) times daily as needed for anxiety., Disp: 60 tablet, Rfl: 2 .  diphenhydrAMINE HCl (ALLERGY MEDICATION PO), Take by mouth., Disp: , Rfl:  .  FLUoxetine (PROZAC) 10 MG capsule, TAKE 3 CAPSULES EVERY MORNING, INCREASE TO 4 CAPS 5 DAYS BEFORE MENSES STARTS THEN BACK TO 3 CAPS, Disp: 270 capsule, Rfl: 0 .  FLUoxetine (PROZAC) 20 MG capsule, TAKE 1 CAPSULE BY MOUTH DAILY, THEN  INCREASE TO 2 DAILY THE WEEK BEFORE MENSES, Disp: 180 capsule, Rfl: 0 .  montelukast (SINGULAIR) 10 MG tablet, Take 10 mg by mouth at bedtime., Disp: , Rfl:  .  omeprazole (PRILOSEC) 20 MG capsule, Take 20 mg by mouth daily. Qod sometimes., Disp: , Rfl:  .  Specialty Vitamins Products (MAGNESIUM, AMINO ACID CHELATE,) 133 MG tablet, Take 1 tablet by mouth 2 (two) times daily., Disp: , Rfl:  Medication Side Effects: none  Family Medical/ Social History: Changes? Yes Coronavirus pandemic, her aunt died last week, her Mom who lives with her is visiting her brother for a month which is a nice break.  MENTAL HEALTH EXAM:  There were no vitals taken for this visit.There is no height or weight on file to calculate BMI.  General Appearance: Casual, Neat and Well Groomed  Eye Contact:  Good  Speech:  Clear and Coherent  Volume:  Normal  Mood:  Euthymic  Affect:  Appropriate  Thought Process:  Goal Directed and Descriptions of Associations: Intact  Orientation:  Full (Time, Place, and Person)  Thought Content: Logical   Suicidal Thoughts:  No  Homicidal Thoughts:  No  Memory:  WNL  Judgement:  Good  Insight:  Good  Psychomotor Activity:  Normal  Concentration:  Concentration: Good  Recall:  Good  Fund of Knowledge: Good  Language: Good  Assets:  Desire for Improvement  ADL's:  Intact  Cognition: WNL  Prognosis:  Good    DIAGNOSES:    ICD-10-CM   1. PMDD (premenstrual dysphoric disorder)  F32.81  2. Generalized anxiety disorder  F41.1   3. Recurrent major depressive disorder, in partial remission [facility])  F33.41     Receiving Psychotherapy: Yes Mertie Clause   RECOMMENDATIONS:  PDMP was reviewed. I spent 20 minutes with her. I am glad to see her doing so well! Continue Prozac 30 mg every morning. increase to 40 mg daily the week before her menses.   Continue Klonopin 1.0 mg, 1/2-1 p.o. twice daily as needed.  (I increased the dose to a 1 mg pill because she feels more comfortable  having the higher dose.) Continue psychotherapy with Rocky Link. Return in 6 months.  Melony Overly, PA-C

## 2019-08-18 DIAGNOSIS — F4310 Post-traumatic stress disorder, unspecified: Secondary | ICD-10-CM | POA: Diagnosis not present

## 2019-08-18 DIAGNOSIS — F411 Generalized anxiety disorder: Secondary | ICD-10-CM | POA: Diagnosis not present

## 2019-08-18 DIAGNOSIS — F341 Dysthymic disorder: Secondary | ICD-10-CM | POA: Diagnosis not present

## 2019-08-25 DIAGNOSIS — F411 Generalized anxiety disorder: Secondary | ICD-10-CM | POA: Diagnosis not present

## 2019-08-25 DIAGNOSIS — F4310 Post-traumatic stress disorder, unspecified: Secondary | ICD-10-CM | POA: Diagnosis not present

## 2019-08-25 DIAGNOSIS — F341 Dysthymic disorder: Secondary | ICD-10-CM | POA: Diagnosis not present

## 2019-09-01 DIAGNOSIS — F4310 Post-traumatic stress disorder, unspecified: Secondary | ICD-10-CM | POA: Diagnosis not present

## 2019-09-01 DIAGNOSIS — F341 Dysthymic disorder: Secondary | ICD-10-CM | POA: Diagnosis not present

## 2019-09-01 DIAGNOSIS — F411 Generalized anxiety disorder: Secondary | ICD-10-CM | POA: Diagnosis not present

## 2019-09-08 DIAGNOSIS — F411 Generalized anxiety disorder: Secondary | ICD-10-CM | POA: Diagnosis not present

## 2019-09-08 DIAGNOSIS — F341 Dysthymic disorder: Secondary | ICD-10-CM | POA: Diagnosis not present

## 2019-09-08 DIAGNOSIS — F4310 Post-traumatic stress disorder, unspecified: Secondary | ICD-10-CM | POA: Diagnosis not present

## 2019-09-09 DIAGNOSIS — F411 Generalized anxiety disorder: Secondary | ICD-10-CM | POA: Diagnosis not present

## 2019-09-15 DIAGNOSIS — F4310 Post-traumatic stress disorder, unspecified: Secondary | ICD-10-CM | POA: Diagnosis not present

## 2019-09-15 DIAGNOSIS — F411 Generalized anxiety disorder: Secondary | ICD-10-CM | POA: Diagnosis not present

## 2019-09-15 DIAGNOSIS — F341 Dysthymic disorder: Secondary | ICD-10-CM | POA: Diagnosis not present

## 2019-09-16 DIAGNOSIS — F411 Generalized anxiety disorder: Secondary | ICD-10-CM | POA: Diagnosis not present

## 2019-09-18 DIAGNOSIS — F411 Generalized anxiety disorder: Secondary | ICD-10-CM | POA: Diagnosis not present

## 2019-09-18 DIAGNOSIS — F341 Dysthymic disorder: Secondary | ICD-10-CM | POA: Diagnosis not present

## 2019-09-18 DIAGNOSIS — F4310 Post-traumatic stress disorder, unspecified: Secondary | ICD-10-CM | POA: Diagnosis not present

## 2019-09-21 DIAGNOSIS — F4310 Post-traumatic stress disorder, unspecified: Secondary | ICD-10-CM | POA: Diagnosis not present

## 2019-09-21 DIAGNOSIS — F341 Dysthymic disorder: Secondary | ICD-10-CM | POA: Diagnosis not present

## 2019-09-21 DIAGNOSIS — F411 Generalized anxiety disorder: Secondary | ICD-10-CM | POA: Diagnosis not present

## 2019-09-23 DIAGNOSIS — F411 Generalized anxiety disorder: Secondary | ICD-10-CM | POA: Diagnosis not present

## 2019-09-29 DIAGNOSIS — F411 Generalized anxiety disorder: Secondary | ICD-10-CM | POA: Diagnosis not present

## 2019-10-06 DIAGNOSIS — F411 Generalized anxiety disorder: Secondary | ICD-10-CM | POA: Diagnosis not present

## 2019-10-07 DIAGNOSIS — F411 Generalized anxiety disorder: Secondary | ICD-10-CM | POA: Diagnosis not present

## 2019-10-11 DIAGNOSIS — F411 Generalized anxiety disorder: Secondary | ICD-10-CM | POA: Diagnosis not present

## 2019-10-13 DIAGNOSIS — F411 Generalized anxiety disorder: Secondary | ICD-10-CM | POA: Diagnosis not present

## 2019-10-20 DIAGNOSIS — F411 Generalized anxiety disorder: Secondary | ICD-10-CM | POA: Diagnosis not present

## 2019-10-22 DIAGNOSIS — R300 Dysuria: Secondary | ICD-10-CM | POA: Diagnosis not present

## 2019-10-22 DIAGNOSIS — R3915 Urgency of urination: Secondary | ICD-10-CM | POA: Diagnosis not present

## 2019-11-07 ENCOUNTER — Other Ambulatory Visit: Payer: Self-pay | Admitting: Physician Assistant

## 2020-01-24 ENCOUNTER — Ambulatory Visit (INDEPENDENT_AMBULATORY_CARE_PROVIDER_SITE_OTHER): Payer: BC Managed Care – PPO | Admitting: Psychiatry

## 2020-01-24 ENCOUNTER — Other Ambulatory Visit: Payer: Self-pay

## 2020-01-24 ENCOUNTER — Encounter: Payer: Self-pay | Admitting: Psychiatry

## 2020-01-24 DIAGNOSIS — F411 Generalized anxiety disorder: Secondary | ICD-10-CM
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
## 2020-02-01 ENCOUNTER — Other Ambulatory Visit: Payer: Self-pay | Admitting: Physician Assistant

## 2020-02-14 ENCOUNTER — Encounter: Payer: Self-pay | Admitting: Physician Assistant

## 2020-02-14 ENCOUNTER — Ambulatory Visit (INDEPENDENT_AMBULATORY_CARE_PROVIDER_SITE_OTHER): Payer: BC Managed Care – PPO | Admitting: Physician Assistant

## 2020-02-14 ENCOUNTER — Other Ambulatory Visit: Payer: Self-pay

## 2020-02-14 DIAGNOSIS — F3281 Premenstrual dysphoric disorder: Secondary | ICD-10-CM | POA: Diagnosis not present

## 2020-02-14 DIAGNOSIS — F411 Generalized anxiety disorder: Secondary | ICD-10-CM

## 2020-02-14 DIAGNOSIS — R5383 Other fatigue: Secondary | ICD-10-CM | POA: Diagnosis not present

## 2020-02-14 DIAGNOSIS — F3342 Major depressive disorder, recurrent, in full remission: Secondary | ICD-10-CM

## 2020-02-14 DIAGNOSIS — F509 Eating disorder, unspecified: Secondary | ICD-10-CM

## 2020-02-14 MED ORDER — BUPROPION HCL ER (XL) 150 MG PO TB24: 150.0000 mg | ORAL_TABLET | Freq: Every day | ORAL | 1 refills | Status: DC

## 2020-02-14 MED ORDER — FLUOXETINE HCL 20 MG PO CAPS: ORAL_CAPSULE | ORAL | 0 refills | Status: DC

## 2020-02-14 MED ORDER — CLONAZEPAM 1 MG PO TABS: 1.0000 mg | ORAL_TABLET | Freq: Two times a day (BID) | ORAL | 2 refills | Status: DC | PRN

## 2020-02-14 NOTE — Progress Notes (Signed)
[facility] Med Check  Patient ID: Sherry Adams,  MRN: [id number]  PCP: Ileana Ladd, MD  Date of Evaluation: 02/14/2020 Time spent:40 minutes  Chief Complaint:  Chief Complaint    Anxiety; Depression      HISTORY/CURRENT STATUS: HPI For 6 month med check.  Has 2 different problems she would like to discuss today.   States she is tired all the time and has been that way for years.  At least 5 years.  She has menorrhagia and felt like that could be part of the problem.  She recently had an IUD placed and is hopeful that will decrease the bleeding.  Her labs have shown no anemia in the past however.  She has not discussed this with me because she felt like the fatigue was either coming from the depression in the past, although not depressed now, or heavy bleeding with her menses, or some other problem.  But now that she is not depressed, she feels like it definitely needs to be addressed.  She asks specifically about Wellbutrin.  Her mom and sister both take it and have more energy and feel better overall.  She is wondering if that would be appropriate for her.  The other new issue that she would like to discuss is the possibility of ADD.  She has several family members who do have ADD.  Gauri wonders whether she should be tested or how to go about the diagnosis and treatment if needed.  As far as her mood goes, she is doing really well.  She is able to enjoy things.  Denies any sudden changes in her mood.  Energy and motivation are as noted above.  Her appetite is good and she is very careful with her diet but no binging or purging or calorie restriction, and is still in OA.  No weight loss or gain.  Not isolating.  Not crying easily.  No suicidal or homicidal thoughts.  Continues to increase the Prozac the week before her menses and that is still helpful. Anxiety is well controlled with the Klonopin and the use of coping techniques that she has learned in therapy.  She is seeing  Sherron Monday, [facility] C, and having EMDR which is extremely helpful.  Denies dizziness, syncope, seizures, numbness, tingling, tremor, tics, unsteady gait, slurred speech, confusion. Denies muscle or joint pain, stiffness, or dystonia.  Individual Medical History/ Review of Systems: Changes? :No    Past medications for mental health diagnoses include: Zoloft, Effexor, Prozac, Klonopin  Allergies: Ibuprofen and Zyrtec [cetirizine]  Current Medications:  Current Outpatient Medications:    albuterol (ACCUNEB) 1.25 MG/3ML nebulizer solution, Take 3 mLs (1.25 mg total) by nebulization every 6 (six) hours as needed for wheezing., Disp: 75 mL, Rfl: 6   albuterol (PROAIR HFA) 108 (90 Base) MCG/ACT inhaler, Inhale 1 puff into the lungs every 6 (six) hours as needed for wheezing or shortness of breath., Disp: , Rfl:    budesonide-formoterol (SYMBICORT) 160-4.5 MCG/ACT inhaler, TAKE 2 PUFFS BY MOUTH TWICE A DAY, Disp: 30.6 Inhaler, Rfl: 3   clonazePAM (KLONOPIN) 1 MG tablet, Take 1 tablet (1 mg total) by mouth 2 (two) times daily as needed for anxiety., Disp: 60 tablet, Rfl: 2   diphenhydrAMINE HCl (ALLERGY MEDICATION PO), Take by mouth., Disp: , Rfl:    FLUoxetine (PROZAC) 10 MG capsule, TAKE 3 CAPSULES EVERY MORNING, INCREASE TO 4 CAPS 5 DAYS BEFORE MENSES STARTS THEN BACK TO 3 CAPS, Disp: 270 capsule, Rfl: 0   fluticasone (  FLONASE) 50 MCG/ACT nasal spray, SPRAY 1 SPRAY INTO EACH NOSTRIL TWICE A DAY, Disp: , Rfl:    montelukast (SINGULAIR) 10 MG tablet, Take 10 mg by mouth at bedtime., Disp: , Rfl:    omeprazole (PRILOSEC) 20 MG capsule, Take 20 mg by mouth daily. Qod sometimes., Disp: , Rfl:    Specialty Vitamins Products (MAGNESIUM, AMINO ACID CHELATE,) 133 MG tablet, Take 1 tablet by mouth 2 (two) times daily., Disp: , Rfl:    buPROPion (WELLBUTRIN XL) 150 MG 24 hr tablet, Take 1 tablet (150 mg total) by mouth daily., Disp: 30 tablet, Rfl: 1   FLUoxetine (PROZAC) 20 MG capsule, 1 qd with 10  mg= 30 mg/d. Then the week before menses, increase dose to a total of 40 mg., Disp: 90 capsule, Rfl: 0 Medication Side Effects: none  Family Medical/ Social History: Changes?  Her mom is no longer living with her.  MENTAL HEALTH EXAM:  There were no vitals taken for this visit.There is no height or weight on file to calculate BMI.  General Appearance: Casual, Neat and Well Groomed  Eye Contact:  Good  Speech:  Clear and Coherent  Volume:  Normal  Mood:  Euthymic  Affect:  Appropriate  Thought Process:  Goal Directed and Descriptions of Associations: Intact  Orientation:  Full (Time, Place, and Person)  Thought Content: Logical   Suicidal Thoughts:  No  Homicidal Thoughts:  No  Memory:  WNL  Judgement:  Good  Insight:  Good  Psychomotor Activity:  Normal  Concentration:  Concentration: Good and Attention Span: Good  Recall:  Good  Fund of Knowledge: Good  Language: Good  Assets:  Desire for Improvement  ADL's:  Intact  Cognition: WNL  Prognosis:  Good    DIAGNOSES:    ICD-10-CM   1. Fatigue, unspecified type  R53.83   2. Recurrent major depressive disorder, in full remission (HCC)  F33.42   3. Generalized anxiety disorder  F41.1   4. PMDD (premenstrual dysphoric disorder)  F32.81   5. Eating disorder, unspecified type  F50.9     Receiving Psychotherapy: Yes With Wynelle Bourgeois, [facility].   RECOMMENDATIONS:  PDMP was reviewed. I provided 40 mins of face to face time during this encounter.  Discussed fatigue possible causes, work-up, and treatment.  We will discuss the treatment further at the next visit.  I do recommend labs and have written on a prescription slip for her PCP to please draw CBC with differential, B12, folate level, CMP, hemoglobin A1c, TSH, 25 hydroxy vitamin D level.  We discussed the possibility of ADD.  I recommend she make an appointment at the [facility] for further evaluation.  We will call and get that set up. We did discuss the Wellbutrin.   It is a mild stimulant in itself and is a good drug to add on to Prozac.  It can be helpful with energy and motivation as well as focus and concentration. Benefits, risk and side effects were discussed and she accepts, and we agreed to add. Start Wellbutrin XL 150 mg, 1 p.o. every morning. Continue Prozac 30 mg every morning. increase to 40 mg daily the week before her menses.  (The dosage on her medication list is confusing.  I think because of insurance reasons we are required to prescribe a 10 mg and 20 mg pill.  At any rate, this dosage of 30 mg daily with an increase to 40 mg daily only the week before her menses, is correct.  Updated 02/14/2020) Continue Klonopin 1.0 mg, 1/2-1 p.o. twice daily as needed.  (I increased the dose to a 1 mg pill because she feels more comfortable having the higher dose.) Continue psychotherapy with Sherron Monday, [facility] C. Return in 4 to 6 weeks.  Melony Overly, PA-C

## 2020-02-16 ENCOUNTER — Other Ambulatory Visit: Payer: Self-pay

## 2020-02-16 ENCOUNTER — Ambulatory Visit (INDEPENDENT_AMBULATORY_CARE_PROVIDER_SITE_OTHER): Payer: BC Managed Care – PPO | Admitting: Psychiatry

## 2020-02-16 ENCOUNTER — Encounter: Payer: Self-pay | Admitting: Psychiatry

## 2020-02-16 DIAGNOSIS — F411 Generalized anxiety disorder: Secondary | ICD-10-CM
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
## 2020-03-01 ENCOUNTER — Ambulatory Visit (INDEPENDENT_AMBULATORY_CARE_PROVIDER_SITE_OTHER): Payer: BC Managed Care – PPO | Admitting: Psychiatry

## 2020-03-01 ENCOUNTER — Other Ambulatory Visit: Payer: Self-pay

## 2020-03-01 ENCOUNTER — Encounter: Payer: Self-pay | Admitting: Psychiatry

## 2020-03-01 DIAGNOSIS — F411 Generalized anxiety disorder: Secondary | ICD-10-CM | POA: Diagnosis not present
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
## 2020-03-07 ENCOUNTER — Other Ambulatory Visit: Payer: Self-pay | Admitting: Physician Assistant

## 2020-03-16 ENCOUNTER — Other Ambulatory Visit: Payer: Self-pay | Admitting: Physician Assistant

## 2020-03-28 ENCOUNTER — Other Ambulatory Visit: Payer: Self-pay

## 2020-03-28 ENCOUNTER — Ambulatory Visit (INDEPENDENT_AMBULATORY_CARE_PROVIDER_SITE_OTHER): Payer: BC Managed Care – PPO | Admitting: Physician Assistant

## 2020-03-28 ENCOUNTER — Encounter: Payer: Self-pay | Admitting: Physician Assistant

## 2020-03-28 DIAGNOSIS — R5383 Other fatigue: Secondary | ICD-10-CM | POA: Diagnosis not present

## 2020-03-28 DIAGNOSIS — F3341 Major depressive disorder, recurrent, in partial remission: Secondary | ICD-10-CM | POA: Diagnosis not present

## 2020-03-28 DIAGNOSIS — F411 Generalized anxiety disorder: Secondary | ICD-10-CM

## 2020-03-28 DIAGNOSIS — F3281 Premenstrual dysphoric disorder: Secondary | ICD-10-CM | POA: Diagnosis not present

## 2020-03-28 DIAGNOSIS — F509 Eating disorder, unspecified: Secondary | ICD-10-CM

## 2020-03-28 NOTE — Progress Notes (Addendum)
[facility] Med Check  Patient ID: Sherry Adams,  MRN: [id number]  PCP: Ileana Ladd, MD  Date of Evaluation: 03/28/2020 Time spent:30 minutes  Chief Complaint:  Chief Complaint    Anxiety; Depression      HISTORY/CURRENT STATUS: HPI For 6 week med check.  At the last visit 6 weeks ago we added Wellbutrin in hopes that would help fatigue and motivation.  It has helped a lot!  She feels more like her normal self, able to enjoy things without being so exhausted.  She does not cry easily.  Personal hygiene is normal.  Not isolating.  Appetite is good.  She is not overeating or restricting calories.  Not binging or purging, no laxative use.  No suicidal or homicidal thoughts.  She was not able to have the labs drawn through her PCP.  She was told they do not draw labs that are ordered by another provider.  Anxiety is well controlled and she has had no increased since adding the Wellbutrin.  No increased anger or irritability.  She does still need the Klonopin and it is helpful when she takes it.  She is still seeing Fred May, [facility] in therapy which is very helpful.  Patient denies increased energy with decreased need for sleep, no increased talkativeness, no racing thoughts, no impulsivity or risky behaviors, no increased spending, no increased libido, no grandiosity, no paranoia, and no hallucinations.  She continues to increase the Prozac dose the week before menses and that has really helped with PMDD.  She is very grateful for that treatment.  "I wish someone had told me that before."  Denies dizziness, syncope, seizures, numbness, tingling, tremor, tics, unsteady gait, slurred speech, confusion. Denies muscle or joint pain, stiffness, or dystonia.  Individual Medical History/ Review of Systems: Changes? :No    Past medications for mental health diagnoses include: Zoloft, Effexor, Prozac, Klonopin, Wellbutrin  Allergies: Ibuprofen and Zyrtec [cetirizine]  Current  Medications:  Current Outpatient Medications:  .  albuterol (ACCUNEB) 1.25 MG/3ML nebulizer solution, Take 3 mLs (1.25 mg total) by nebulization every 6 (six) hours as needed for wheezing., Disp: 75 mL, Rfl: 6 .  albuterol (VENTOLIN HFA) 108 (90 Base) MCG/ACT inhaler, Inhale 1 puff into the lungs every 6 (six) hours as needed for wheezing or shortness of breath., Disp: , Rfl:  .  budesonide-formoterol (SYMBICORT) 160-4.5 MCG/ACT inhaler, TAKE 2 PUFFS BY MOUTH TWICE A DAY, Disp: 30.6 Inhaler, Rfl: 3 .  buPROPion (WELLBUTRIN XL) 150 MG 24 hr tablet, TAKE 1 TABLET BY MOUTH EVERY DAY, Disp: 90 tablet, Rfl: 1 .  clonazePAM (KLONOPIN) 1 MG tablet, Take 1 tablet (1 mg total) by mouth 2 (two) times daily as needed for anxiety., Disp: 60 tablet, Rfl: 2 .  diphenhydrAMINE HCl (ALLERGY MEDICATION PO), Take by mouth., Disp: , Rfl:  .  FLUoxetine (PROZAC) 10 MG capsule, TAKE 3 CAPSULES EVERY MORNING, INCREASE TO 4 CAPS 5 DAYS BEFORE MENSES STARTS THEN BACK TO 3 CAPS, Disp: 270 capsule, Rfl: 0 .  FLUoxetine (PROZAC) 20 MG capsule, TAKE 1 CAPSULE BY MOUTH DAILY, THEN INCREASE TO 2 DAILY THE WEEK BEFORE MENSES, Disp: 180 capsule, Rfl: 0 .  fluticasone (FLONASE) 50 MCG/ACT nasal spray, SPRAY 1 SPRAY INTO EACH NOSTRIL TWICE A DAY, Disp: , Rfl:  .  montelukast (SINGULAIR) 10 MG tablet, Take 10 mg by mouth at bedtime., Disp: , Rfl:  .  omeprazole (PRILOSEC) 20 MG capsule, Take 20 mg by mouth daily. Qod sometimes., Disp: , Rfl:  .  Specialty Vitamins Products (MAGNESIUM, AMINO ACID CHELATE,) 133 MG tablet, Take 1 tablet by mouth 2 (two) times daily., Disp: , Rfl:  Medication Side Effects: none  Family Medical/ Social History: Changes? No  MENTAL HEALTH EXAM:  There were no vitals taken for this visit.There is no height or weight on file to calculate BMI.  General Appearance: Casual, Neat and Well Groomed  Eye Contact:  Good  Speech:  Clear and Coherent  Volume:  Normal  Mood:  Euthymic  Affect:  Appropriate   Thought Process:  Goal Directed and Descriptions of Associations: Intact  Orientation:  Full (Time, Place, and Person)  Thought Content: Logical   Suicidal Thoughts:  No  Homicidal Thoughts:  No  Memory:  WNL  Judgement:  Good  Insight:  Good  Psychomotor Activity:  Normal  Concentration:  Concentration: Good and Attention Span: Good  Recall:  Good  Fund of Knowledge: Good  Language: Good  Assets:  Desire for Improvement  ADL's:  Intact  Cognition: WNL  Prognosis:  Good    DIAGNOSES:    ICD-10-CM   1. Fatigue, unspecified type  R53.83   2. Recurrent major depressive disorder, in partial remission (HCC)  F33.41   3. PMDD (premenstrual dysphoric disorder)  F32.81   4. Generalized anxiety disorder  F41.1   5. Eating disorder, unspecified type  F50.9     Receiving Psychotherapy: Yes With Wynelle Bourgeois, [facility].   RECOMMENDATIONS:  PDMP was reviewed. I provided 30 minutes of face-to-face time during this encounter, in which we discussed her increased symptoms and decided that since she is feeling better as far as the fatigue goes that we will hold off on having all those labs drawn.  If the fatigue recurs then I will will order the labs through [facility] request.  She understands and can let me know at any time before her next visit if the fatigue returns. I am glad to see her doing so much better. No changes will be made. Continue Wellbutrin XL 150 mg, 1 p.o. every morning. Continue Klonopin 1 mg, 1 p.o. twice daily as needed. Continue Prozac 30 mg every morning. increase to 40 mg daily the week before her menses.  (The dosage on her medication list is confusing.  I think because of insurance reasons we are required to prescribe a 10 mg and 20 mg pill.  At any rate, this dosage of 30 mg daily with an increase to 40 mg daily only the week before her menses, is correct.  Updated 03/28/2020) Continue psychotherapy with Sherron Monday, [facility]. Return in 3 months.   Melony Overly, PA-C

## 2020-03-29 DIAGNOSIS — F509 Eating disorder, unspecified: Secondary | ICD-10-CM

## 2020-03-29 DIAGNOSIS — F3281 Premenstrual dysphoric disorder: Secondary | ICD-10-CM

## 2020-03-29 DIAGNOSIS — F411 Generalized anxiety disorder: Secondary | ICD-10-CM | POA: Insufficient documentation

## 2020-03-29 DIAGNOSIS — F3341 Major depressive disorder, recurrent, in partial remission: Secondary | ICD-10-CM | POA: Insufficient documentation

## 2020-03-29 HISTORY — DX: Eating disorder, unspecified: F50.9

## 2020-03-29 HISTORY — DX: Generalized anxiety disorder: F41.1

## 2020-03-29 HISTORY — DX: Premenstrual dysphoric disorder: F32.81

## 2020-04-03 ENCOUNTER — Ambulatory Visit (INDEPENDENT_AMBULATORY_CARE_PROVIDER_SITE_OTHER): Payer: BC Managed Care – PPO | Admitting: Psychiatry

## 2020-04-03 ENCOUNTER — Encounter: Payer: Self-pay | Admitting: Psychiatry

## 2020-04-03 ENCOUNTER — Other Ambulatory Visit: Payer: Self-pay

## 2020-04-03 DIAGNOSIS — F411 Generalized anxiety disorder: Secondary | ICD-10-CM | POA: Diagnosis not present
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
## 2020-04-10 ENCOUNTER — Ambulatory Visit: Payer: BC Managed Care – PPO | Admitting: Psychiatry

## 2020-04-17 ENCOUNTER — Ambulatory Visit (INDEPENDENT_AMBULATORY_CARE_PROVIDER_SITE_OTHER): Payer: BC Managed Care – PPO | Admitting: Psychiatry

## 2020-04-17 ENCOUNTER — Encounter: Payer: Self-pay | Admitting: Psychiatry

## 2020-04-17 ENCOUNTER — Other Ambulatory Visit: Payer: Self-pay

## 2020-04-17 DIAGNOSIS — F411 Generalized anxiety disorder: Secondary | ICD-10-CM | POA: Diagnosis not present
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
## 2020-04-24 ENCOUNTER — Ambulatory Visit: Payer: BC Managed Care – PPO | Admitting: Psychiatry

## 2020-05-01 ENCOUNTER — Ambulatory Visit: Payer: BC Managed Care – PPO | Admitting: Psychiatry

## 2020-05-15 ENCOUNTER — Ambulatory Visit (INDEPENDENT_AMBULATORY_CARE_PROVIDER_SITE_OTHER): Payer: BC Managed Care – PPO | Admitting: Psychiatry

## 2020-05-15 ENCOUNTER — Encounter: Payer: Self-pay | Admitting: Psychiatry

## 2020-05-15 ENCOUNTER — Other Ambulatory Visit: Payer: Self-pay

## 2020-05-15 DIAGNOSIS — F411 Generalized anxiety disorder: Secondary | ICD-10-CM | POA: Diagnosis not present
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
## 2020-05-29 ENCOUNTER — Ambulatory Visit (INDEPENDENT_AMBULATORY_CARE_PROVIDER_SITE_OTHER): Payer: BC Managed Care – PPO | Admitting: Psychiatry

## 2020-05-29 ENCOUNTER — Encounter: Payer: Self-pay | Admitting: Psychiatry

## 2020-05-29 ENCOUNTER — Other Ambulatory Visit: Payer: Self-pay

## 2020-05-29 DIAGNOSIS — F411 Generalized anxiety disorder: Secondary | ICD-10-CM | POA: Diagnosis not present

## 2020-05-29 NOTE — Progress Notes (Signed)
      [facility] Counselor/Therapist Progress Note  Patient ID: Sherry Adams, MRN: [id number],    Date: 05/29/2020  Time Spent: 50 minutes   Treatment Type: Individual Therapy  Reported Symptoms: aniety  Mental Status Exam:  Appearance:   Casual and Well Groomed     Behavior:  Appropriate  Motor:  Normal  Speech/Language:   Clear and Coherent  Affect:  Appropriate  Mood:  anxious  Thought process:  normal  Thought content:    WNL  Sensory/Perceptual disturbances:    WNL  Orientation:  oriented to person, place, time/date and situation  Attention:  Good  Concentration:  Good  Memory:  WNL  Fund of knowledge:   Good  Insight:    Good  Judgment:   Good  Impulse Control:  Good   Risk Assessment: Danger to Self:  No Self-injurious Behavior: No Danger to Others: No Duty to Warn:no Physical Aggression / Violence:No  Access to Firearms a concern: No  Gang Involvement:No   Subjective: The client states that she went to a [employer] and felt herself being really triggered.  "The singing really set me off.  It was about suffering."  This brought up issues around the client's social anxiety.  Today we used eye-movement focusing on this.  The client's negative cognition is, "I am never good enough.  I let others define me."  She feels fear and frustration in her stomach.  Her subjective units of distress is a 9.  As the client processed she went through a lot of childhood issues all the way back to being a baby.  She realized that her mom was jealous of her.  She had stuffed a lot of emotions and experiences into her psychic chest so that she could survive.  Today she rapidly went through those and got rid of them.  She described how cruel her sister was to her which tied into others defining her.  She discussed that her parents adopted to twin boys which they really did not have the resources to do.  "It was like taking on more suffering."  As the client continued to process  she realized that she could trust her judgment and others did not have to define her.  Her subjective units of distress was 2.  Her positive cognition was, "I need to find myself."  Interventions: Assertiveness/Communication, Motivational Interviewing, Solution-Oriented/Positive Psychology, [employer] Desensitization and Reprocessing (EMDR) and Insight-Oriented  Diagnosis:   ICD-10-CM   1. Generalized anxiety disorder  F41.1     Plan: Positive self talk, self-care, acceptance, mood independent behavior, assertiveness, boundaries.  Sherry Adams, [facility]

## 2020-06-12 ENCOUNTER — Ambulatory Visit (INDEPENDENT_AMBULATORY_CARE_PROVIDER_SITE_OTHER): Payer: BC Managed Care – PPO | Admitting: Psychiatry

## 2020-06-12 ENCOUNTER — Encounter: Payer: Self-pay | Admitting: Psychiatry

## 2020-06-12 ENCOUNTER — Other Ambulatory Visit: Payer: Self-pay

## 2020-06-12 DIAGNOSIS — F411 Generalized anxiety disorder: Secondary | ICD-10-CM | POA: Diagnosis not present
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
## 2020-06-13 ENCOUNTER — Other Ambulatory Visit: Payer: Self-pay | Admitting: Physician Assistant

## 2020-06-13 ENCOUNTER — Other Ambulatory Visit: Payer: Self-pay | Admitting: Internal Medicine

## 2020-06-13 NOTE — Telephone Encounter (Signed)
Ok x 3 months then ov for refills or defer to PCP if doing great

## 2020-06-13 NOTE — Telephone Encounter (Signed)
Refill request sent from pharmacy please advise if patient still need this refilled by [name] since follow up is PRN

## 2020-06-15 NOTE — Telephone Encounter (Signed)
Pt does have f/u scheduled with MW 4/26. Nothing further needed.

## 2020-06-27 ENCOUNTER — Other Ambulatory Visit: Payer: Self-pay

## 2020-06-27 ENCOUNTER — Encounter: Payer: Self-pay | Admitting: Physician Assistant

## 2020-06-27 ENCOUNTER — Ambulatory Visit (INDEPENDENT_AMBULATORY_CARE_PROVIDER_SITE_OTHER): Payer: BC Managed Care – PPO | Admitting: Physician Assistant

## 2020-06-27 DIAGNOSIS — F411 Generalized anxiety disorder: Secondary | ICD-10-CM

## 2020-06-27 DIAGNOSIS — F509 Eating disorder, unspecified: Secondary | ICD-10-CM | POA: Diagnosis not present

## 2020-06-27 DIAGNOSIS — F3281 Premenstrual dysphoric disorder: Secondary | ICD-10-CM | POA: Diagnosis not present

## 2020-06-27 DIAGNOSIS — F3341 Major depressive disorder, recurrent, in partial remission: Secondary | ICD-10-CM

## 2020-06-27 MED ORDER — FLUOXETINE HCL 10 MG PO CAPS: ORAL_CAPSULE | ORAL | 0 refills | Status: DC

## 2020-06-27 MED ORDER — CLONAZEPAM 1 MG PO TABS: 1.0000 mg | ORAL_TABLET | Freq: Two times a day (BID) | ORAL | 2 refills | Status: DC | PRN

## 2020-06-27 NOTE — Progress Notes (Signed)
[facility] Med Check  Patient ID: Sherry Adams,  MRN: [id number]  PCP: Ileana Ladd, MD  Date of Evaluation: 06/27/2020 Time spent:30 minutes  Chief Complaint:  Chief Complaint    Anxiety; Depression      HISTORY/CURRENT STATUS: HPI For routine med check.  Is doing really well. Has some stressors, just normal life issues, 'nothing major' but feels that medications are working really well.  She is able to enjoy things.  Not isolating.  Energy and motivation..  Does not cry easily.  Appetite is normal and weight is stable.  No suicidal or homicidal thoughts.  Taking the Prozac higher dose the week before menses has worked really well.  However in the past few months she is only had some cramping with light spotting a few days so it is difficult to know when to take that extra Prozac.  Mood has been stable though.  Anxiety is well controlled with Klonopin.  Does not need it very often but it is helpful when she needs it.  Sleeps well most of the time. No binging or purging.  No calorie restricting no laxative use.  Patient denies increased energy with decreased need for sleep, no increased talkativeness, no racing thoughts, no impulsivity or risky behaviors, no increased spending, no increased libido, no grandiosity, no increased irritability or anger, and no hallucinations.  Denies dizziness, syncope, seizures, numbness, tingling, tremor, tics, unsteady gait, slurred speech, confusion. Denies muscle or joint pain, stiffness, or dystonia.  Individual Medical History/ Review of Systems: Changes? :No    Past medications for mental health diagnoses include: Zoloft, Effexor, Prozac, Klonopin, Wellbutrin  Allergies: Ibuprofen and Zyrtec [cetirizine]  Current Medications:  Current Outpatient Medications:  .  albuterol (ACCUNEB) 1.25 MG/3ML nebulizer solution, Take 3 mLs (1.25 mg total) by nebulization every 6 (six) hours as needed for wheezing., Disp: 75 mL, Rfl: 6 .  albuterol  (VENTOLIN HFA) 108 (90 Base) MCG/ACT inhaler, Inhale 1 puff into the lungs every 6 (six) hours as needed for wheezing or shortness of breath., Disp: , Rfl:  .  budesonide-formoterol (SYMBICORT) 160-4.5 MCG/ACT inhaler, TAKE 2 PUFFS BY MOUTH TWICE A DAY, Disp: 30.6 each, Rfl: 1 .  buPROPion (WELLBUTRIN XL) 150 MG 24 hr tablet, TAKE 1 TABLET BY MOUTH EVERY DAY, Disp: 90 tablet, Rfl: 1 .  diphenhydrAMINE HCl (ALLERGY MEDICATION PO), Take by mouth., Disp: , Rfl:  .  FLUoxetine (PROZAC) 20 MG capsule, TAKE 1 CAPSULE BY MOUTH DAILY, THEN INCREASE TO 2 DAILY THE WEEK BEFORE MENSES, Disp: 180 capsule, Rfl: 0 .  fluticasone (FLONASE) 50 MCG/ACT nasal spray, SPRAY 1 SPRAY INTO EACH NOSTRIL TWICE A DAY, Disp: , Rfl:  .  montelukast (SINGULAIR) 10 MG tablet, Take 10 mg by mouth at bedtime., Disp: , Rfl:  .  omeprazole (PRILOSEC) 20 MG capsule, Take 20 mg by mouth daily. Qod sometimes., Disp: , Rfl:  .  Specialty Vitamins Products (MAGNESIUM, AMINO ACID CHELATE,) 133 MG tablet, Take 1 tablet by mouth 2 (two) times daily., Disp: , Rfl:  .  clonazePAM (KLONOPIN) 1 MG tablet, Take 1 tablet (1 mg total) by mouth 2 (two) times daily as needed for anxiety., Disp: 60 tablet, Rfl: 2 .  FLUoxetine (PROZAC) 10 MG capsule, Take one po qd w/ the Prozac 20mg =30 mg daily., Disp: 270 capsule, Rfl: 0 Medication Side Effects: none  Family Medical/ Social History: Changes?  No  MENTAL HEALTH EXAM:  There were no vitals taken for this visit.There is no height or weight on  file to calculate BMI.  General Appearance: Casual, Neat and Well Groomed  Eye Contact:  Good  Speech:  Clear and Coherent and Normal Rate  Volume:  Normal  Mood:  Euthymic  Affect:  Appropriate  Thought Process:  Goal Directed and Descriptions of Associations: Intact  Orientation:  Full (Time, Place, and Person)  Thought Content: Logical   Suicidal Thoughts:  No  Homicidal Thoughts:  No  Memory:  WNL  Judgement:  Good  Insight:  Good  Psychomotor  Activity:  Normal  Concentration:  Concentration: Good  Recall:  Good  Fund of Knowledge: Good  Language: Good  Assets:  Desire for Improvement  ADL's:  Intact  Cognition: WNL  Prognosis:  Good    DIAGNOSES:    ICD-10-CM   1. Recurrent major depressive disorder, in partial remission (HCC)  F33.41   2. PMDD (premenstrual dysphoric disorder)  F32.81   3. Generalized anxiety disorder  F41.1   4. Eating disorder, unspecified type  F50.9     Receiving Psychotherapy: Yes With Fred May, [facility]   RECOMMENDATIONS:  PDMP was reviewed. I provided 30 minutes of face-to-face time during this encounter, including time spent before and after the visit in chart review and charting. She is doing really well so no changes are needed. Continue Wellbutrin XL 150 mg, 1 p.o. every morning. Continue Klonopin 1 mg, 1 p.o. twice daily as needed. Continue Prozac 30 mg every morning with then increased to 40 mg daily 1 week prior to menses and then go back to 30 mg after her menses begins. Continue therapy with prednisone may [facility]. Return in 4 months.  Melony Overly, PA-C

## 2020-07-05 ENCOUNTER — Other Ambulatory Visit: Payer: Self-pay

## 2020-07-05 ENCOUNTER — Ambulatory Visit (INDEPENDENT_AMBULATORY_CARE_PROVIDER_SITE_OTHER): Payer: BC Managed Care – PPO | Admitting: Psychiatry

## 2020-07-05 ENCOUNTER — Encounter: Payer: Self-pay | Admitting: Psychiatry

## 2020-07-05 DIAGNOSIS — F411 Generalized anxiety disorder: Secondary | ICD-10-CM | POA: Diagnosis not present
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
## 2020-07-12 ENCOUNTER — Ambulatory Visit: Payer: BC Managed Care – PPO | Admitting: Psychiatry

## 2020-07-18 ENCOUNTER — Ambulatory Visit (INDEPENDENT_AMBULATORY_CARE_PROVIDER_SITE_OTHER): Payer: BC Managed Care – PPO | Admitting: Internal Medicine

## 2020-07-18 ENCOUNTER — Encounter: Payer: Self-pay | Admitting: Internal Medicine

## 2020-07-18 ENCOUNTER — Other Ambulatory Visit: Payer: Self-pay

## 2020-07-18 DIAGNOSIS — J4530 Mild persistent asthma, uncomplicated: Secondary | ICD-10-CM

## 2020-07-18 NOTE — Patient Instructions (Signed)
Pepcid 20 mg twice daily after meals instead prilosec  - if not able to tolerate the pepcid then need to see Dr Modesto Charon / GI    I will refer you to Dr Lucie Leather re ongoing allergy issues    Once you are established with Dr Lucie Leather see me as needed

## 2020-07-18 NOTE — Progress Notes (Signed)
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44 identified  lots of outdoor allergies on shots / theophylline never able to stop it or tried on  other maint rx, missed lots of school and saw pulmonary doctor helped a lot age 8 when taught how to use inhaler and not a lot better then  changed from [place] to [place] after last daughter born 52 and generally better since then moved to [place] around 2014 had only one bad episode of flare related to gerd better on rx for gerd and maintained on budesonide bid and reflux meds plus saba avg nightly  And referred to pulmonary clinic 10/22/2016 by Dr  Jacelyn Grip    History of Present Illness  10/22/2016 1st [facility] office visit/ Sherry Adams   Chief Complaint  Patient presents with  . Pulmonary Consult    Self referral. She states she was dxed with Asthma age 50. She states had illness 2 months ago that made her symptoms flare.  Today she reports she is back at her normal baseline, but wanted to est care for Asthma. She uses proair 2 puffs every night.   back to baseline = avg saba once every  hs along with bud neb bid  Afraid to ex due to "allergies"  / does yoga ok but no indoor aerobics Cough is no  longer an issue/ maint on prilosec 20 mg each pm  rec Prilosec 20 mg  Take 30-60 min before first meal of the day  GERD  Diet   Plan A = Automatic = Symbicort 160 Take 2 puffs first thing in am and then another 2 puffs about 12 hours later.  Work on inhaler technique:  Plan B = Backup Only use your albuterol as a rescue medication  Plan C = Crisis - only use your albuterol nebulizer if you first try Plan B and it fails to help > ok to use the nebulizer up to every 4 hours but if start needing it regularly call for immediate appointment    01/22/2017  f/u ov/Sherry Adams re: mild chronic asthma Chief Complaint  Patient  presents with  . Follow-up    Breathing is doing well. She is using her albuterol inhaler 2 x per wk on average. She rarely uses neb.    still not using symbicort 160 perfectly regularly 2bid or effectively  and   using allegra as maint rx not as prn but overall much better since started symb 160  Has not tried ex yet "afraid of allergies/ wheezing" and wonders if/why still needs saba prior to ex   Noct fine rec Plan A = Automatic = Symbicort 160 Take 2 puffs first thing in am and then another 2 puffs about 12 hours later.  Work on inhaler technique:  Plan B = Backup Only use your albuterol  Plan C = Crisis - only use your albuterol nebulizer if you first try Plan B             11/18/2017  f/u ov/Sherry Adams re: mild chronic asthma - worse on symb 160 one twice daily so back to 2bid Chief Complaint  Patient presents with  . Follow-up    Breathing is doing well. She is using her albuterol on average once per month.    Dyspnea:  Ex bike up  an hour qod s problems Cough: no Sleeping: 30 degrees due to gerd SABA use: once a month  02: none   rec No change rx   05/29/2018  f/u ov/Sherry Adams re: mild chronic asthma/ still has dog in bedroom  On symb 160/singulair  Chief Complaint  Patient presents with  . Follow-up    Breathing is doing well. She rarely uses her proair.    Dyspnea:  Not limited by breathing from desired activities  / some aerobics Cough: no Sleeping: 30 degrees SABA use: rarely  02: none  rec No change rx   07/18/2020  f/u ov/Sherry Adams re: mild chronic asthma/ tries to keep dog out of Bedroom  Symb/singulair  Chief Complaint  Patient presents with  . Follow-up    Sob-same  Dyspnea:  Really not limited with ex  Cough: cough Sleeping: 30 degrees out of habit SABA use: every week or 2  02: none  Covid status:   moderna x 3    No obvious day to day or daytime variability or assoc excess/ purulent sputum or mucus plugs or hemoptysis or cp or chest tightness, subjective  wheeze or overt   hb symptoms (as long as keeps taking ppi)    Sleeping as above without nocturnal  or early am exacerbation  of respiratory  c/o's or need for noct saba. Also denies any obvious fluctuation of symptoms with weather or environmental changes or other aggravating or alleviating factors except as outlined above   No unusual exposure hx or h/o childhood pna or knowledge of premature birth.  Current Allergies, Complete Past Medical History, Past Surgical History, Family History, and Social History were reviewed in [employer] record.  ROS  The following are not active complaints unless bolded Hoarseness, sore throat, dysphagia, dental problems, itching, sneezing,  nasal congestion or discharge of excess mucus or purulent secretions, ear ache,   fever, chills, sweats, unintended wt loss or wt gain, classically pleuritic or exertional cp,  orthopnea pnd or arm/hand swelling  or leg swelling, presyncope, palpitations, abdominal pain, anorexia, nausea, vomiting, diarrhea  or change in bowel habits or change in bladder habits, change in stools or change in urine, dysuria, hematuria,  rash, arthralgias, visual complaints, headache, numbness, weakness or ataxia or problems with walking or coordination,  change in mood or  memory.        Current Meds  Medication Sig  . albuterol (ACCUNEB) 1.25 MG/3ML nebulizer solution Take 3 mLs (1.25 mg total) by nebulization every 6 (six) hours as needed for wheezing.  Marland Kitchen albuterol (VENTOLIN HFA) 108 (90 Base) MCG/ACT inhaler Inhale 1 puff into the lungs every 6 (six) hours as needed for wheezing or shortness of breath.  . budesonide-formoterol (SYMBICORT) 160-4.5 MCG/ACT inhaler TAKE 2 PUFFS BY MOUTH TWICE A DAY  . buPROPion (WELLBUTRIN XL) 150 MG 24 hr tablet TAKE 1 TABLET BY MOUTH EVERY DAY  . clonazePAM (KLONOPIN) 1 MG tablet Take 1 tablet (1 mg total) by mouth 2 (two) times daily as needed for anxiety.  . diphenhydrAMINE HCl  (ALLERGY MEDICATION PO) Take by mouth.  Marland Kitchen FLUoxetine (PROZAC) 10 MG capsule Take one po qd w/ the Prozac 20mg =30 mg daily.  FLUoxetine (PROZAC) 20 MG capsule TAKE 1 CAPSULE BY MOUTH DAILY, THEN INCREASE TO 2 DAILY THE WEEK BEFORE MENSES  . fluticasone (FLONASE) 50 MCG/ACT nasal spray SPRAY 1 SPRAY INTO EACH NOSTRIL TWICE A DAY  . montelukast (SINGULAIR) 10 MG tablet Take 10 mg by mouth at bedtime.  Marland Kitchen  omeprazole (PRILOSEC) 20 MG capsule Take 20 mg by mouth daily. Qod sometimes.  . rosuvastatin (CRESTOR) 20 MG tablet Take 20 mg by mouth daily. Take 1 tablet every other day  . Specialty Vitamins Products (MAGNESIUM, AMINO ACID CHELATE,) 133 MG tablet Take 1 tablet by mouth 2 (two) times daily.                     Objective:   Physical Exam   Wt Readings from Last 3 Encounters:  07/18/20 183 lb 6.4 oz (83.2 kg)      Vital signs reviewed  07/18/2020  - Note at rest 02 sats  97% on RA   General appearance:    amb pleasant wf nad   HEENT : pt wearing mask not removed for exam due to covid -19 concerns.    NECK :  without JVD/Nodes/TM/ nl carotid upstrokes bilaterally   LUNGS: no acc muscle use,  Nl contour chest which is clear to A and P bilaterally without cough on insp or exp maneuvers   CV:  RRR  no s3 or murmur or increase in P2, and no edema   ABD:  soft and nontender with nl inspiratory excursion in the supine position. No bruits or organomegaly appreciated, bowel sounds nl  MS:  Nl gait/ ext warm without deformities, calf tenderness, cyanosis or clubbing No obvious joint restrictions   SKIN: warm and dry without lesions    NEURO:  alert, approp, nl sensorium with  no motor or cerebellar deficits apparent.                  Assessment:

## 2020-07-18 NOTE — Assessment & Plan Note (Signed)
Onset in Childhood Allergy profile 10/22/2016 >  Eos 0.3/  IgE 73  RAST pos dust > dog> cat   Tree> grass, min mold  - Spirometry 10/22/2016  wnl though mild curvature on budesonide neb  s saba prior - 10/22/2016   try symb 160 2bid - 05/29/2018  After extensive coaching inhaler device,  effectiveness =    90%   All goals of chronic asthma control met including optimal function and elimination of symptoms with minimal need for rescue therapy.  Contingencies discussed in full including contacting this office immediately if not controlling the symptoms using the rule of two's.     Main issues are allergic rhinitis/ gerd so rec allergy eval and taper ppi to h2 bid if tol and if not then consider gi eval   Pulmonary f/u is prn           Each maintenance medication was reviewed in detail including emphasizing most importantly the difference between maintenance and prns and under what circumstances the prns are to be triggered using an action plan format where appropriate.  Total time for H and P, chart review, counseling, reviewing hfa  device(s) and generating customized AVS unique to this office visit / same day charting =  25 min

## 2020-07-19 ENCOUNTER — Ambulatory Visit (INDEPENDENT_AMBULATORY_CARE_PROVIDER_SITE_OTHER): Payer: BC Managed Care – PPO | Admitting: Psychiatry

## 2020-07-19 ENCOUNTER — Encounter: Payer: Self-pay | Admitting: Psychiatry

## 2020-07-19 DIAGNOSIS — F411 Generalized anxiety disorder: Secondary | ICD-10-CM
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
## 2020-08-07 ENCOUNTER — Ambulatory Visit: Payer: BC Managed Care – PPO | Admitting: Psychiatry

## 2020-08-17 ENCOUNTER — Ambulatory Visit (INDEPENDENT_AMBULATORY_CARE_PROVIDER_SITE_OTHER): Payer: BC Managed Care – PPO | Admitting: Addiction (Substance Use Disorder)

## 2020-08-17 ENCOUNTER — Other Ambulatory Visit: Payer: Self-pay

## 2020-08-17 DIAGNOSIS — F3341 Major depressive disorder, recurrent, in partial remission: Secondary | ICD-10-CM | POA: Diagnosis not present
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
## 2020-08-22 ENCOUNTER — Ambulatory Visit: Payer: BC Managed Care – PPO | Admitting: Psychiatry

## 2020-08-24 ENCOUNTER — Ambulatory Visit (INDEPENDENT_AMBULATORY_CARE_PROVIDER_SITE_OTHER): Payer: BC Managed Care – PPO | Admitting: Addiction (Substance Use Disorder)

## 2020-08-24 ENCOUNTER — Other Ambulatory Visit: Payer: Self-pay

## 2020-08-24 DIAGNOSIS — F3281 Premenstrual dysphoric disorder: Secondary | ICD-10-CM

## 2020-08-24 DIAGNOSIS — F3341 Major depressive disorder, recurrent, in partial remission: Secondary | ICD-10-CM

## 2020-08-24 NOTE — Progress Notes (Signed)
      [facility] Counselor/Therapist Progress Note  Patient ID: Sherry Adams, MRN: [id number],    Date: 08/24/2020  Time Spent:  Treatment Type: Individual Therapy  Reported Symptoms: dysregulated by cycle  Mental Status Exam:  Appearance:   Casual     Behavior:  Appropriate and Rigid  Motor:  Normal  Speech/Language:   Clear and Coherent and Normal Rate  Affect:  Appropriate  Mood:  anxious and labile  Thought process:  circumstantial  Thought content:    Obsessions and Rumination  Sensory/Perceptual disturbances:    WNL  Orientation:  x4  Attention:  Good  Concentration:  Good  Memory:  WNL  Fund of knowledge:   Good  Insight:    Fair  Judgment:   Good  Impulse Control:  Good   Risk Assessment: Danger to Self:  No Self-injurious Behavior: No Danger to Others: No Duty to Warn:no Physical Aggression / Violence:No  Access to Firearms a concern: No  Gang Involvement:No   Subjective: Client came in processing extreme emotional dysregulation prior to her cycle and drop of serotonin. Client reported having issues regulating and feeling safe in her own skin so therapist assessed for safety today and inquired about that day and client denied SI/HI/AVH and denied any addictive behaviors. Client working hard to regulate herself now and therapist used MI, mindfulness, and DBT to validate and join with client on what she experienced, help her find ways to regulate herself emotionally, and understand more about how to tolerate more distress. Therapist also provided psychoeducation about top down and bottom up control of emotions/body sensations/thoughts.   Interventions: Dialectical Behavioral Therapy, Mindfulness Meditation, Motivational Interviewing and Psycho-education/Bibliotherapy  Diagnosis:   ICD-10-CM   1. PMDD (premenstrual dysphoric disorder)  F32.81   2. Recurrent major depressive disorder, in partial remission (HCC)  F33.41     Plan: Mood independent  behavior, positive self talk, self-care, assertiveness, boundaries  [employer], LCSW, LCAS, CCTP, CCS, BSP

## 2020-08-28 ENCOUNTER — Ambulatory Visit: Payer: BC Managed Care – PPO | Admitting: Psychiatry

## 2020-09-08 ENCOUNTER — Other Ambulatory Visit: Payer: Self-pay | Admitting: Physician Assistant

## 2020-09-14 ENCOUNTER — Other Ambulatory Visit: Payer: Self-pay

## 2020-09-14 ENCOUNTER — Ambulatory Visit (INDEPENDENT_AMBULATORY_CARE_PROVIDER_SITE_OTHER): Payer: BC Managed Care – PPO | Admitting: Addiction (Substance Use Disorder)

## 2020-09-14 DIAGNOSIS — F3281 Premenstrual dysphoric disorder: Secondary | ICD-10-CM

## 2020-09-14 NOTE — Progress Notes (Signed)
      [facility] Counselor/Therapist Progress Note  Patient ID: Sherry Adams, MRN: [id number],    Date: 09/14/2020  Time Spent:  Treatment Type: Individual Therapy  Reported Symptoms: anxious, panic, mood swing  Mental Status Exam:  Appearance:   Casual     Behavior:  Appropriate and Rigid  Motor:  Normal  Speech/Language:   Clear and Coherent and Normal Rate  Affect:  Appropriate  Mood:  anxious and labile  Thought process:  circumstantial  Thought content:    Obsessions and Rumination  Sensory/Perceptual disturbances:    WNL  Orientation:  x4  Attention:  Good  Concentration:  Good  Memory:  WNL  Fund of knowledge:   Good  Insight:    Fair  Judgment:   Good  Impulse Control:  Good   Risk Assessment: Danger to Self:  No Self-injurious Behavior: No Danger to Others: No Duty to Warn:no Physical Aggression / Violence:No  Access to Firearms a concern: No  Gang Involvement:No   Subjective: Client reported anxiety, panic, & mood swings due to her PMS cycle and a situation with her daughter. Client processed feeling out of control when her kids are struggling and also being worried about her daughter who is self-harming and smoking THC. Client discussed how she tried to motivate her daughter and roleplayed with therapist (MI & roleplaying used) what to continue to explain to her daughter. Client reported having a spiritual conviction with not confronting her daughter for smoking THC. Therapist used CBT to help her process more about her thoughts that consumed her and affected her feelings. Therapist used psychoed to help client learn more about coping skills to help her regulate her thoughts by bringing her body back to a state of homeostasis.; client set goals to help herself find internal regulation. Therapist assessed for safety today and client denied SI/HI/AVH.   Interventions: Cognitive Behavioral Therapy, Roleplay, and Motivational Interviewing  Diagnosis:    ICD-10-CM   1. PMDD (premenstrual dysphoric disorder)  F32.81       Plan: Mood independent behavior, positive self talk, self-care, assertiveness, boundaries  [employer], LCSW, LCAS, CCTP, CCS, BSP

## 2020-09-19 ENCOUNTER — Ambulatory Visit (INDEPENDENT_AMBULATORY_CARE_PROVIDER_SITE_OTHER): Payer: BC Managed Care – PPO | Admitting: Allergy and Immunology

## 2020-09-19 ENCOUNTER — Other Ambulatory Visit: Payer: Self-pay

## 2020-09-19 VITALS — BP 106/62 | HR 68 | Temp 98.0°F | Resp 16 | Ht 69.0 in | Wt 188.0 lb

## 2020-09-19 DIAGNOSIS — J301 Allergic rhinitis due to pollen: Secondary | ICD-10-CM

## 2020-09-19 DIAGNOSIS — J3089 Other allergic rhinitis: Secondary | ICD-10-CM | POA: Diagnosis not present

## 2020-09-19 DIAGNOSIS — J4540 Moderate persistent asthma, uncomplicated: Secondary | ICD-10-CM | POA: Diagnosis not present

## 2020-09-19 DIAGNOSIS — T781XXA Other adverse food reactions, not elsewhere classified, initial encounter: Secondary | ICD-10-CM

## 2020-09-19 NOTE — Patient Instructions (Addendum)
  1.  Allergen avoidance measures -dust mite, cat, dog, pollens, cockroach    2.  Treat and prevent inflammation:  A. Symbicort 160 - 2 inhalations 2 times per day (empty lungs) B. Flonase - 1-2 sprays each nostril 1 time per day C. Montelukast 10 mg - 1 tablet 1 time per day  3. If needed:  A. Fexofenadine 180 - 1 tablet 1 time per day B. Albuterol HFA - 2 inhalations 1 time per day C. Auvi-Q 0.3, benadryl, MD/ER evaluation for allergic reaction  4. Start a course of immunotherapy  5. Plan for fall flu vaccine  6. Return to clinic in 4 weeks or earlier if problem
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
## 2020-09-20 ENCOUNTER — Encounter: Payer: Self-pay | Admitting: Allergy and Immunology

## 2020-09-20 MED ORDER — EPINEPHRINE 0.3 MG/0.3ML IJ SOAJ: 0.3000 mg | Freq: Once | INTRAMUSCULAR | 1 refills | Status: AC

## 2020-09-26 ENCOUNTER — Telehealth: Payer: Self-pay | Admitting: Allergy and Immunology

## 2020-09-26 NOTE — Telephone Encounter (Signed)
Spoke with patient, Informed her that prescription was sent to [facility] a mail ordered pharmacy. I came patient the number [phone]. Patient is going to call the pharmacy to set up delivery

## 2020-09-26 NOTE — Telephone Encounter (Signed)
Patient called and said that the [facility]pi[facility] [address] [phone].

## 2020-09-27 ENCOUNTER — Other Ambulatory Visit: Payer: Self-pay | Admitting: Allergy and Immunology

## 2020-09-27 DIAGNOSIS — J3089 Other allergic rhinitis: Secondary | ICD-10-CM

## 2020-09-27 DIAGNOSIS — J4540 Moderate persistent asthma, uncomplicated: Secondary | ICD-10-CM

## 2020-09-27 DIAGNOSIS — J301 Allergic rhinitis due to pollen: Secondary | ICD-10-CM

## 2020-09-27 NOTE — Progress Notes (Signed)
Aeroallergen Immunotherapy   Ordering Provider: Dr. Laurette Schimke   Patient Details  Name: Sherry Adams  MRN: [id number]  Date of Birth: September 07, 1970   Order two of two   Vial Label: tree, grass, cat, dog   0.3 ml (Volume)  BAU Concentration -- 7 Grass Mix* 100,000 ([address] [place], [place], [place], Perennial Rye, RedTop, Sweet Vernal, Timothy)  0.1 ml (Volume)  BAU Concentration -- [place] 10,000  0.5 ml (Volume)  1:20 Concentration -- Eastern 10 Tree Mix (also Sweet Gum)  0.1 ml (Volume)  1:20 Concentration -- Box Elder  0.1 ml (Volume)  1:10 Concentration -- Cedar, red  0.1 ml (Volume)  1:10 Concentration -- Pecan Pollen  0.1 ml (Volume)  1:20 Concentration -- Walnut, Black Pollen  0.3 ml (Volume)  1:10 Concentration -- Cat Hair  0.3 ml (Volume)  1:10 Concentration -- Dog Epithelia    1.9  ml Extract Subtotal  3.1  ml Diluent  5.0  ml Maintenance Total   Schedule:  B   Blue Vial (1:100,000): Schedule B (6 doses)  Yellow Vial (1:10,000): Schedule B (6 doses)  Green Vial (1:1,000): Schedule B (6 doses)  Red Vial (1:100): Schedule A (12 doses)   Special Instructions: 1-2 times per week

## 2020-09-27 NOTE — Progress Notes (Signed)
Aeroallergen Immunotherapy   Ordering Provider: Dr. Laurette Schimke   Patient Details  Name: Sherry Adams  MRN: [id number]  Date of Birth: 27-Jan-1971   Order one of two   Vial Label: dust mite, weed   0.2 ml (Volume)  1:20 Concentration -- Cocklebur  0.2 ml (Volume)  1:20 Concentration -- Lamb's Quarters*  0.2 ml (Volume)  1:20 Concentration -- Mugwort, Common*  0.5 ml (Volume)   AU Concentration -- Mite Mix (DF 5,000 & DP 5,000)    1.1  ml Extract Subtotal  3.9  ml Diluent  5.0  ml Maintenance Total   Schedule:  B   Blue Vial (1:100,000): Schedule B (6 doses)  Yellow Vial (1:10,000): Schedule B (6 doses)  Green Vial (1:1,000): Schedule B (6 doses)  Red Vial (1:100): Schedule A (12 doses)   Special Instructions: 1-2 times per week

## 2020-09-27 NOTE — Progress Notes (Signed)
VIALS MADE. EXP 09-27-21 

## 2020-09-28 ENCOUNTER — Ambulatory Visit: Payer: BC Managed Care – PPO | Admitting: Addiction (Substance Use Disorder)

## 2020-09-28 DIAGNOSIS — J3081 Allergic rhinitis due to animal (cat) (dog) hair and dander: Secondary | ICD-10-CM

## 2020-09-30 ENCOUNTER — Other Ambulatory Visit: Payer: Self-pay | Admitting: Physician Assistant

## 2020-10-10 ENCOUNTER — Ambulatory Visit: Payer: Self-pay

## 2020-10-12 ENCOUNTER — Other Ambulatory Visit: Payer: Self-pay

## 2020-10-12 ENCOUNTER — Ambulatory Visit (INDEPENDENT_AMBULATORY_CARE_PROVIDER_SITE_OTHER): Payer: BC Managed Care – PPO | Admitting: Addiction (Substance Use Disorder)

## 2020-10-12 DIAGNOSIS — F3341 Major depressive disorder, recurrent, in partial remission: Secondary | ICD-10-CM

## 2020-10-12 NOTE — Progress Notes (Signed)
      [facility] Counselor/Therapist Progress Note  Patient ID: Sherry Adams, MRN: [id number],    Date: 10/12/2020  Time Spent:  Treatment Type: Individual Therapy  Reported Symptoms: resentful, frustrated, triggered  Mental Status Exam:  Appearance:   Casual     Behavior:  Appropriate and Rigid  Motor:  Normal  Speech/Language:   Clear and Coherent and Normal Rate  Affect:  Appropriate  Mood:  anxious and irritable  Thought process:  circumstantial  Thought content:    Obsessions and Rumination  Sensory/Perceptual disturbances:    WNL  Orientation:  x4  Attention:  Good  Concentration:  Good  Memory:  WNL  Fund of knowledge:   Good  Insight:    Fair  Judgment:   Good  Impulse Control:  Good   Risk Assessment: Danger to Self:  No Self-injurious Behavior: No Danger to Others: No Duty to Warn:no Physical Aggression / Violence:No  Access to Firearms a concern: No  Gang Involvement:No   Subjective: Client reported feeling irritable, resentful, and triggered. Client feeling triggered and anxious due to having to go see her borderline mom tomorrow for 4 days. Client reported her mom is a queen borderline and has unreasonable expectations for everyone. Client feels she is a people pleaser and works hard to hold boundaries (using her RP plan), but gets keyed up thinking of going. Client asked therapist about the Window of tolerance & therapist provided psychoeducation and MI to support her as she processed more about how her hyperarousal made it hard to deal with what is happening in her life right now. Therapist used CBT to help her process more about her thoughts that trigger big feelings. Therapist assessed for safety today and client denied SI/HI/AVH.   Interventions: Cognitive Behavioral Therapy, Dialectical Behavioral Therapy, and Motivational Interviewing & RPT  Diagnosis:   ICD-10-CM   1. Recurrent major depressive disorder, in partial remission (HCC)  F33.41        Plan: Mood independent behavior, positive self talk, self-care, assertiveness, boundaries  [employer], LCSW, LCAS, CCTP, CCS, BSP

## 2020-10-13 ENCOUNTER — Telehealth: Payer: Self-pay | Admitting: Internal Medicine

## 2020-10-13 NOTE — Telephone Encounter (Signed)
ATC x1 regarding refill of albuterol inhaler/nebulizer.  Left VM to return call.

## 2020-10-16 ENCOUNTER — Telehealth: Payer: Self-pay | Admitting: Allergy and Immunology

## 2020-10-16 ENCOUNTER — Other Ambulatory Visit: Payer: Self-pay | Admitting: *Deleted

## 2020-10-16 DIAGNOSIS — J4530 Mild persistent asthma, uncomplicated: Secondary | ICD-10-CM

## 2020-10-16 MED ORDER — ALBUTEROL SULFATE 1.25 MG/3ML IN NEBU
1.0000 | INHALATION_SOLUTION | Freq: Four times a day (QID) | RESPIRATORY_TRACT | 1 refills | Status: DC | PRN
Start: 2020-10-16 — End: 2020-11-21

## 2020-10-16 MED ORDER — ALBUTEROL SULFATE HFA 108 (90 BASE) MCG/ACT IN AERS: 1.0000 | INHALATION_SPRAY | Freq: Four times a day (QID) | RESPIRATORY_TRACT | 1 refills | Status: DC | PRN

## 2020-10-16 NOTE — Telephone Encounter (Signed)
Please advise 

## 2020-10-16 NOTE — Telephone Encounter (Signed)
Please provide Sherry Adams a the albuterol prescription she desires.

## 2020-10-16 NOTE — Telephone Encounter (Signed)
Prescriptions have been sent in. Called patient and advised. Patient verbalized understanding.  ?

## 2020-10-16 NOTE — Telephone Encounter (Signed)
Patient is requesting Dr. Lucie Leather take over her albuterol prescriptions, (albuterol for nebulizer and inhaler). Patient is requesting [facility] - [address], [place] [address]  Patient is requesting a call when medications are sent in, [phone]

## 2020-10-17 ENCOUNTER — Ambulatory Visit: Payer: Self-pay | Admitting: Family Medicine

## 2020-10-17 ENCOUNTER — Ambulatory Visit: Payer: Self-pay

## 2020-10-17 NOTE — Telephone Encounter (Signed)
Dr. Lucie Leather has sent in pt's albuterol. See phone note from 7/25. Will close encounter.

## 2020-10-24 ENCOUNTER — Encounter: Payer: Self-pay | Admitting: Physician Assistant

## 2020-10-24 ENCOUNTER — Other Ambulatory Visit: Payer: Self-pay

## 2020-10-24 ENCOUNTER — Ambulatory Visit (INDEPENDENT_AMBULATORY_CARE_PROVIDER_SITE_OTHER): Payer: BC Managed Care – PPO | Admitting: Physician Assistant

## 2020-10-24 DIAGNOSIS — R5383 Other fatigue: Secondary | ICD-10-CM

## 2020-10-24 DIAGNOSIS — F331 Major depressive disorder, recurrent, moderate: Secondary | ICD-10-CM | POA: Diagnosis not present

## 2020-10-24 DIAGNOSIS — F3341 Major depressive disorder, recurrent, in partial remission: Secondary | ICD-10-CM

## 2020-10-24 DIAGNOSIS — F3281 Premenstrual dysphoric disorder: Secondary | ICD-10-CM | POA: Diagnosis not present

## 2020-10-24 DIAGNOSIS — F411 Generalized anxiety disorder: Secondary | ICD-10-CM | POA: Diagnosis not present

## 2020-10-24 MED ORDER — BUPROPION HCL ER (XL) 300 MG PO TB24: 300.0000 mg | ORAL_TABLET | Freq: Every day | ORAL | 1 refills | Status: DC

## 2020-10-24 NOTE — Progress Notes (Signed)
[facility] Med Check  Patient ID: Sherry Adams,  MRN: [id number]  PCP: Ileana Ladd, MD  Date of Evaluation: 10/24/2020 Time spent:40 minutes  Chief Complaint:  Chief Complaint   Anxiety; Depression      HISTORY/CURRENT STATUS: HPI For routine med check.  She is having some problems with fatigue, difficulty getting up and getting going in the mornings.  After she gets started then she is okay.  Not really sure if it is depression but some sadness is present.  It improves after she gets going after her day starts.  She has been taking the Wellbutrin in the evening because she was forgetting it in the mornings.  Thinks she sleeps okay.  She usually enjoys things and energy and motivation are good except as described.  Not isolating.  Work is okay.  Denies suicidal or homicidal thoughts.  Just returned 2 days ago from being with her mom in [place].  She was there for 4 days.  Mom is difficult so the visit was mentally exhausting.  Menses is irregular as she is perimenopausal so sometimes it is difficult to know when to bump up the Prozac.  And then having been with her mom this past week, when patient was having PMS symptoms but no menses, makes it hard to know if it is time to bump up the Prozac or not.  Patient denies increased energy with decreased need for sleep, no increased talkativeness, no racing thoughts, no impulsivity or risky behaviors, no increased spending, no increased libido, no grandiosity, no increased irritability or anger, and no hallucinations.  Denies dizziness, syncope, seizures, numbness, tingling, tremor, tics, unsteady gait, slurred speech, confusion. Denies muscle or joint pain, stiffness, or dystonia.  Individual Medical History/ Review of Systems: Changes? :No    Past medications for mental health diagnoses include: Zoloft, Effexor, Prozac, Klonopin, Wellbutrin  Allergies: Ibuprofen and Zyrtec [cetirizine]  Current Medications:  Current  Outpatient Medications:    albuterol (ACCUNEB) 1.25 MG/3ML nebulizer solution, Take 3 mLs (1.25 mg total) by nebulization every 6 (six) hours as needed for wheezing., Disp: 75 mL, Rfl: 1   albuterol (VENTOLIN HFA) 108 (90 Base) MCG/ACT inhaler, Inhale 1 puff into the lungs every 6 (six) hours as needed for wheezing or shortness of breath., Disp: 18 g, Rfl: 1   budesonide-formoterol (SYMBICORT) 160-4.5 MCG/ACT inhaler, TAKE 2 PUFFS BY MOUTH TWICE A DAY, Disp: 30.6 each, Rfl: 1   buPROPion (WELLBUTRIN XL) 300 MG 24 hr tablet, Take 1 tablet (300 mg total) by mouth daily., Disp: 90 tablet, Rfl: 1   clonazePAM (KLONOPIN) 1 MG tablet, Take 1 tablet (1 mg total) by mouth 2 (two) times daily as needed for anxiety., Disp: 60 tablet, Rfl: 2   diphenhydrAMINE HCl (ALLERGY MEDICATION PO), Take by mouth., Disp: , Rfl:    fexofenadine-pseudoephedrine (ALLEGRA-D 24) 180-240 MG 24 hr tablet, 1 tablet, Disp: , Rfl:    FLUoxetine (PROZAC) 10 MG capsule, TAKE 1 CAPSULE BY MOUTH EVERY DAY ALONG WITH 20MG  CAPSULE TO =30MG  DAILY, Disp: 270 capsule, Rfl: 0   FLUoxetine (PROZAC) 20 MG capsule, TAKE 1 CAPSULE BY MOUTH DAILY, THEN INCREASE TO 2 DAILY THE WEEK BEFORE MENSES, Disp: 180 capsule, Rfl: 0   fluticasone (FLONASE) 50 MCG/ACT nasal spray, SPRAY 1 SPRAY INTO EACH NOSTRIL TWICE A DAY, Disp: , Rfl:    montelukast (SINGULAIR) 10 MG tablet, Take 10 mg by mouth at bedtime., Disp: , Rfl:    omeprazole (PRILOSEC) 20 MG capsule, Take 20 mg by  mouth daily. Qod sometimes., Disp: , Rfl:    rosuvastatin (CRESTOR) 20 MG tablet, Take 20 mg by mouth daily. Take 1 tablet every other day, Disp: , Rfl:    Specialty Vitamins Products (MAGNESIUM, AMINO ACID CHELATE,) 133 MG tablet, Take 1 tablet by mouth 2 (two) times daily., Disp: , Rfl:  Medication Side Effects: none  Family Medical/ Social History: Changes?  No  MENTAL HEALTH EXAM:  There were no vitals taken for this visit.There is no height or weight on file to calculate BMI.   General Appearance: Casual, Neat and Well Groomed  Eye Contact:  Good  Speech:  Clear and Coherent and Normal Rate  Volume:  Normal  Mood:  Euthymic  Affect:  Appropriate  Thought Process:  Goal Directed and Descriptions of Associations: Intact  Orientation:  Full (Time, Place, and Person)  Thought Content: Logical   Suicidal Thoughts:  No  Homicidal Thoughts:  No  Memory:  WNL  Judgement:  Good  Insight:  Good  Psychomotor Activity:  Normal  Concentration:  Concentration: Good and Attention Span: Good  Recall:  Good  Fund of Knowledge: Good  Language: Good  Assets:  Desire for Improvement  ADL's:  Intact  Cognition: WNL  Prognosis:  Good    DIAGNOSES:    ICD-10-CM   1. PMDD (premenstrual dysphoric disorder)  F32.81     2. Major depressive disorder, recurrent episode, moderate (HCC)  F33.1     3. Fatigue, unspecified type  R53.83     4. Generalized anxiety disorder  F41.1        Receiving Psychotherapy: Yes   [name] Shutter, LCSW    RECOMMENDATIONS:  PDMP was reviewed. Last Klonopin 06/27/2020. I provided 40 minutes of face to face time during this encounter, including time spent before and after the visit in records review, medical decision making, and charting.  Discussed the timing of Wellbutrin, take it in the mornings because it could be preventing good, deep sleep.  We agreed to increase the dose as well.  Does not feel like it is helping as much as it should. Discussed the anxiety and that it is okay to take Klonopin, recommend she take at least half a pill for the next couple of nights so she will get back in normal sleep cycle.  Also discussed the PMDD.  In perimenopause with irregular menses it can be difficult to know exactly when to increase the Prozac.  I recommend that when her mood starts to change around the week before her menses should began, increase the Prozac at that time but if she does not have a regular cycle then stop the Prozac after about 5 days  at the higher dose.  She understands. Increase Wellbutrin XL to 300 mg,1 p.o. every morning. Continue Klonopin 1 mg, 1 p.o. twice daily as needed. Continue Prozac 30 mg every morning with then increased to 40 mg daily 1 week prior to menses and then go back to 30 mg after her menses begins. Continue therapy with [name] Shutter, LCSW.  Return in 6 weeks.   Melony Overly, PA-C

## 2020-10-25 ENCOUNTER — Ambulatory Visit (INDEPENDENT_AMBULATORY_CARE_PROVIDER_SITE_OTHER): Payer: Self-pay | Admitting: *Deleted

## 2020-10-25 DIAGNOSIS — J309 Allergic rhinitis, unspecified: Secondary | ICD-10-CM

## 2020-10-25 NOTE — Progress Notes (Signed)
Immunotherapy   Patient Details  Name: Sherry Adams MRN: [id number] Date of Birth: November 28, 1970  10/25/2020  [name] started injections for  Dustmite-Weed and Tree-Grass-Cat-Dog Following schedule: B  Frequency:1 time per week Epi-Pen:Epi-Pen Available  Consent signed and patient instructions given. Pt waited 30 minutes with no issues.    [name] 10/25/2020, 11:26 AM

## 2020-10-26 ENCOUNTER — Ambulatory Visit (INDEPENDENT_AMBULATORY_CARE_PROVIDER_SITE_OTHER): Payer: BC Managed Care – PPO | Admitting: Addiction (Substance Use Disorder)

## 2020-10-26 ENCOUNTER — Other Ambulatory Visit: Payer: Self-pay

## 2020-10-26 DIAGNOSIS — F3341 Major depressive disorder, recurrent, in partial remission: Secondary | ICD-10-CM

## 2020-10-26 DIAGNOSIS — F509 Eating disorder, unspecified: Secondary | ICD-10-CM

## 2020-10-26 NOTE — Progress Notes (Signed)
      [facility] Counselor/Therapist Progress Note  Patient ID: Sherry Adams, MRN: [id number],    Date: 10/26/2020  Time Spent:  Treatment Type: Individual Therapy  Reported Symptoms: anxious, keyed up  Mental Status Exam:  Appearance:   Casual     Behavior:  Appropriate  Motor:  Normal  Speech/Language:   Clear and Coherent and Normal Rate  Affect:  Appropriate  Mood:  anxious and sad  Thought process:  circumstantial  Thought content:    Obsessions and Rumination  Sensory/Perceptual disturbances:    WNL  Orientation:  x4  Attention:  Good  Concentration:  Good  Memory:  WNL  Fund of knowledge:   Good  Insight:    Good  Judgment:   Good  Impulse Control:  Good   Risk Assessment: Danger to Self:  No Self-injurious Behavior: No Danger to Others: No Duty to Warn:no Physical Aggression / Violence:No  Access to Firearms a concern: No  Gang Involvement:No   Subjective: Client reported having a lot of anxiety and trouble breathing since coming home from seeing her mom and brother. Client feels a struggle to get back into "her own life" and out of the head space she was in, in [place] that was toxic. Client making progress beginning to regulate but was in need of support while processing it all. Therapist used MI & BSP with client to validate her feelings, support her in identifying feelings and somatic sensations triggering, assisting in grounding emotionally, and help her process thoughts and feelings coming up about the trip. Therapist led client in using mindfulness techniques to help her regulate her breathing and be able to talk about the stressor while staying calm, allowing her nervous system to regulate and rewire. Therapist also worked with client using OA 12 steps to help support her continued sobriety and recovery, despite the triggers. Therapist assessed for safety today and client denied SI/HI/AVH.   Interventions: Cognitive Behavioral Therapy, Mindfulness  Meditation, Motivational Interviewing, and BSP  & RPT  Diagnosis:   ICD-10-CM   1. Recurrent major depressive disorder, in partial remission (HCC)  F33.41     2. Eating disorder, unspecified type  F50.9       Plan: Mood independent behavior, positive self talk, self-care, assertiveness, boundaries  [employer], LCSW, LCAS, CCTP, CCS, BSP

## 2020-11-02 ENCOUNTER — Other Ambulatory Visit: Payer: Self-pay

## 2020-11-02 ENCOUNTER — Ambulatory Visit (INDEPENDENT_AMBULATORY_CARE_PROVIDER_SITE_OTHER): Payer: BC Managed Care – PPO | Admitting: *Deleted

## 2020-11-02 ENCOUNTER — Ambulatory Visit (INDEPENDENT_AMBULATORY_CARE_PROVIDER_SITE_OTHER): Payer: BC Managed Care – PPO | Admitting: Addiction (Substance Use Disorder)

## 2020-11-02 DIAGNOSIS — J309 Allergic rhinitis, unspecified: Secondary | ICD-10-CM | POA: Diagnosis not present

## 2020-11-02 DIAGNOSIS — F3341 Major depressive disorder, recurrent, in partial remission: Secondary | ICD-10-CM | POA: Diagnosis not present

## 2020-11-02 NOTE — Progress Notes (Signed)
      [facility] Counselor/Therapist Progress Note  Patient ID: Sherry Adams, MRN: [id number],    Date: 11/02/2020  Time Spent:  Treatment Type: Individual Therapy  Reported Symptoms: angry, upset, labile, distressed  Mental Status Exam:  Appearance:   Casual     Behavior:  Appropriate  Motor:  Normal  Speech/Language:   Clear and Coherent and Normal Rate  Affect:  Appropriate  Mood:  angry and labile  Thought process:  circumstantial  Thought content:    Obsessions and Rumination  Sensory/Perceptual disturbances:    WNL  Orientation:  x4  Attention:  Good  Concentration:  Good  Memory:  WNL  Fund of knowledge:   Good  Insight:    Good  Judgment:   Good  Impulse Control:  Good   Risk Assessment: Danger to Self:  No Self-injurious Behavior: No Danger to Others: No Duty to Warn:no Physical Aggression / Violence:No  Access to Firearms a concern: No  Gang Involvement:No   Subjective: Client reported feeling a lot of anger and mixed feelings about her step son and her husbands inability to parent him. Client felt a lot of distress in her body that felt intolerable and therapist used MI to explore this with the client providing support along the way. Therapist also used BSP & RPT with the client to help her use a resource spot connected to the anger in her eyes that helped her find relief and gain more insight into the triggers for her anger. Client made progress, her SUDs reducing to 1/10 from 9/10. Therapist asked client how she was working on her goals for coping with emotions outside the session to take care of herself and validate her feelings. Therapist assessed for safety today and client denied SI/HI/AVH.   Interventions: Cognitive Behavioral Therapy, Mindfulness Meditation, Motivational Interviewing, and BSP  & RPT  Diagnosis:   ICD-10-CM   1. Recurrent major depressive disorder, in partial remission (HCC)  F33.41        Plan: Mood independent behavior,  positive self talk, self-care, assertiveness, boundaries  [employer], LCSW, LCAS, CCTP, CCS, BSP

## 2020-11-09 ENCOUNTER — Ambulatory Visit (INDEPENDENT_AMBULATORY_CARE_PROVIDER_SITE_OTHER): Payer: BC Managed Care – PPO | Admitting: Addiction (Substance Use Disorder)

## 2020-11-09 DIAGNOSIS — F509 Eating disorder, unspecified: Secondary | ICD-10-CM

## 2020-11-09 DIAGNOSIS — F3341 Major depressive disorder, recurrent, in partial remission: Secondary | ICD-10-CM

## 2020-11-09 NOTE — Progress Notes (Signed)
      [facility] Counselor/Therapist Progress Note  Patient ID: Sherry Adams, MRN: [id number],    Date: 11/09/2020  Time Spent: 54  Treatment Type: Individual Therapy  Reported Symptoms: triggered, anxious   Mental Status Exam:  Appearance:   Casual     Behavior:  Appropriate  Motor:  Normal  Speech/Language:   Clear and Coherent and Normal Rate  Affect:  Appropriate  Mood:  anxious and labile  Thought process:  circumstantial  Thought content:    Obsessions and Rumination  Sensory/Perceptual disturbances:    WNL  Orientation:  x4  Attention:  Good  Concentration:  Good  Memory:  WNL  Fund of knowledge:   Good  Insight:    Good  Judgment:   Good  Impulse Control:  Good   Risk Assessment: Danger to Self:  No Self-injurious Behavior: No Danger to Others: No Duty to Warn:no Physical Aggression / Violence:No  Access to Firearms a concern: No  Gang Involvement:No   Subjective: Client reported recently being triggered and getting anxious. Client being triggered by 2 things in her OA meetings and yoga class that brought up feeling to restrict. Client learning to listen to her body and client making progress realizing what is triggering her overeating and realized some times it happens because she has restricted. Client taking steps to use more intuitive eating and use more emotionally regulated. Client identifying her co-dependent tendency to worry about letting someone else down. Client feeling like she owes her yoga teacher an apology for having to stop attending the class. Client processed the distress in her body and her desire to ground emotionally; therapist used MI & mindfulness to support her in grounding.Therapist used psychoed about window of tolerance to help client take inventory of how she is feeling this day and prescribe only what she can handle for this day. Therapist assessed for safety today and client denied SI/HI/AVH.   Interventions: Mindfulness Meditation  and Motivational Interviewing & RPT  Diagnosis:   ICD-10-CM   1. Recurrent major depressive disorder, in partial remission (HCC)  F33.41     2. Eating disorder, unspecified type  F50.9       Plan: Mood independent behavior, positive self talk, self-care, assertiveness, boundaries  [employer], LCSW, LCAS, CCTP, CCS, BSP

## 2020-11-10 ENCOUNTER — Ambulatory Visit (INDEPENDENT_AMBULATORY_CARE_PROVIDER_SITE_OTHER): Payer: BC Managed Care – PPO

## 2020-11-10 DIAGNOSIS — J309 Allergic rhinitis, unspecified: Secondary | ICD-10-CM | POA: Diagnosis not present

## 2020-11-17 ENCOUNTER — Ambulatory Visit (INDEPENDENT_AMBULATORY_CARE_PROVIDER_SITE_OTHER): Payer: BC Managed Care – PPO

## 2020-11-17 DIAGNOSIS — J309 Allergic rhinitis, unspecified: Secondary | ICD-10-CM

## 2020-11-21 ENCOUNTER — Ambulatory Visit (INDEPENDENT_AMBULATORY_CARE_PROVIDER_SITE_OTHER): Payer: BC Managed Care – PPO | Admitting: Allergy and Immunology

## 2020-11-21 ENCOUNTER — Other Ambulatory Visit: Payer: Self-pay

## 2020-11-21 VITALS — BP 124/70 | HR 85 | Temp 98.2°F | Resp 16 | Ht 69.0 in | Wt 191.4 lb

## 2020-11-21 DIAGNOSIS — J3089 Other allergic rhinitis: Secondary | ICD-10-CM

## 2020-11-21 DIAGNOSIS — J4540 Moderate persistent asthma, uncomplicated: Secondary | ICD-10-CM | POA: Diagnosis not present

## 2020-11-21 DIAGNOSIS — J309 Allergic rhinitis, unspecified: Secondary | ICD-10-CM

## 2020-11-21 DIAGNOSIS — T781XXA Other adverse food reactions, not elsewhere classified, initial encounter: Secondary | ICD-10-CM

## 2020-11-21 DIAGNOSIS — J301 Allergic rhinitis due to pollen: Secondary | ICD-10-CM

## 2020-11-21 MED ORDER — ALBUTEROL SULFATE HFA 108 (90 BASE) MCG/ACT IN AERS: 1.0000 | INHALATION_SPRAY | Freq: Four times a day (QID) | RESPIRATORY_TRACT | 1 refills | Status: DC | PRN

## 2020-11-21 MED ORDER — ALBUTEROL SULFATE 1.25 MG/3ML IN NEBU: 1.0000 | INHALATION_SOLUTION | Freq: Four times a day (QID) | RESPIRATORY_TRACT | 1 refills | Status: DC | PRN

## 2020-11-21 NOTE — Patient Instructions (Addendum)
  1.  Allergen avoidance measures -dust mite, cat, dog, pollens, cockroach    2.  Treat and prevent inflammation:  A. Symbicort 160 - 2 inhalations 1-2 times per day   B. Flonase - 1-2 sprays each nostril 1 time per day C. Montelukast 10 mg - 1 tablet 1 time per day D. Immunotherapy  3. If needed:  A. Fexofenadine 180 - 1 tablet 1 time per day B. Albuterol HFA - 2 inhalations 1 time per day C. Auvi-Q 0.3, benadryl, MD/ER evaluation for allergic reaction  4. Plan for fall flu vaccine  5. Return to clinic in 6 months or earlier if problem

## 2020-11-21 NOTE — Progress Notes (Signed)
[place] [facility] [place] [facility] [place] [facility] [place] [facility]   Follow-up Note  Referring Provider: Ileana Ladd, MD Primary Provider: Ileana Ladd, MD Date of Office Visit: 11/21/2020  Subjective:   Sherry Adams (DOB: 05-07-1970) is a 50 y.o. female who [facility] in re-evaluation of the following:  HPI: Sherry Adams returns to this clinic in reevaluation of asthma and allergic rhinoconjunctivitis and oral allergy syndrome.  Her last visit to this clinic was 19 September 2020.  Overall she has done relatively well since her last visit.  She has not required a systemic steroid or antibiotic for any type of airway issue while she continues on Symbicort and Flonase and montelukast on a consistent basis.  Over the course of the past 2 to 3 days she did have a little bit more problems with some coughing and some shortness of breath and she has used her albuterol.  She does not really have any associated upper airway symptoms or reflux symptoms with this event.  Her immunotherapy is going quite well.  She started this form of treatment 25 October 2020.  Allergies as of 11/21/2020       Reactions   Ibuprofen    "bronchospams"   Zyrtec [cetirizine] Photosensitivity        Medication List    albuterol 108 (90 Base) MCG/ACT inhaler Commonly known as: VENTOLIN HFA Inhale 1 puff into the lungs every 6 (six) hours as needed for wheezing or shortness of breath.   albuterol 1.25 MG/3ML nebulizer solution Commonly known as: ACCUNEB Take 3 mLs (1.25 mg total) by nebulization every 6 (six) hours as needed for wheezing.   ALLERGY MEDICATION PO Take by mouth.   budesonide-formoterol 160-4.5 MCG/ACT inhaler Commonly known as: SYMBICORT TAKE 2 PUFFS BY MOUTH TWICE A DAY   buPROPion 300 MG 24 hr tablet Commonly known as: Wellbutrin XL Take 1 tablet (300 mg total) by mouth daily.   clonazePAM 1 MG tablet Commonly known as: KlonoPIN Take 1 tablet (1  mg total) by mouth 2 (two) times daily as needed for anxiety.   fexofenadine-pseudoephedrine 180-240 MG 24 hr tablet Commonly known as: ALLEGRA-D 24 1 tablet   FLUoxetine 20 MG capsule Commonly known as: PROZAC TAKE 1 CAPSULE BY MOUTH DAILY, THEN INCREASE TO 2 DAILY THE WEEK BEFORE MENSES   FLUoxetine 10 MG capsule Commonly known as: PROZAC TAKE 1 CAPSULE BY MOUTH EVERY DAY ALONG WITH 20MG  CAPSULE TO =30MG  DAILY   fluticasone 50 MCG/ACT nasal spray Commonly known as: FLONASE SPRAY 1 SPRAY INTO EACH NOSTRIL TWICE A DAY   magnesium (amino acid chelate) 133 MG tablet Take 1 tablet by mouth 2 (two) times daily.   montelukast 10 MG tablet Commonly known as: SINGULAIR Take 10 mg by mouth at bedtime.   omeprazole 20 MG capsule Commonly known as: PRILOSEC Take 20 mg by mouth daily. Qod sometimes.   rosuvastatin 20 MG tablet Commonly known as: CRESTOR Take 20 mg by mouth daily. Take 1 tablet every other day    Past Medical History:  Diagnosis Date   Asthma    Endometriosis     Past Surgical History:  Procedure Laterality Date   LAPAROSCOPY     for endometriosis   WISDOM TOOTH EXTRACTION      Review of systems negative except as noted in HPI / PMHx or noted below:  Review of Systems  Constitutional: Negative.   HENT: Negative.    Eyes: Negative.  Respiratory: Negative.    Cardiovascular: Negative.   Gastrointestinal: Negative.   Genitourinary: Negative.   Musculoskeletal: Negative.   Skin: Negative.   Neurological: Negative.   Endo/Heme/Allergies: Negative.   Psychiatric/Behavioral: Negative.      Objective:   Vitals:   11/21/20 1355  BP: 124/70  Pulse: 85  Resp: 16  Temp: 98.2 F (36.8 C)  SpO2: 97%   Height: 5\' 9"  (175.3 cm)  Weight: 191 lb 6.4 oz (86.8 kg)   Physical Exam Constitutional:      Appearance: She is not diaphoretic.  HENT:     Head: Normocephalic.     Right Ear: Tympanic membrane, ear canal and external ear normal.     Left  Ear: Tympanic membrane, ear canal and external ear normal.     Nose: Nose normal. No mucosal edema or rhinorrhea.     Mouth/Throat:     Pharynx: Uvula midline. No oropharyngeal exudate.  Eyes:     Conjunctiva/sclera: Conjunctivae normal.  Neck:     Thyroid: No thyromegaly.     Trachea: Trachea normal. No tracheal tenderness or tracheal deviation.  Cardiovascular:     Rate and Rhythm: Normal rate and regular rhythm.     Heart sounds: Normal heart sounds, S1 normal and S2 normal. No murmur heard. Pulmonary:     Effort: No respiratory distress.     Breath sounds: Normal breath sounds. No stridor. No wheezing or rales.  Lymphadenopathy:     Head:     Right side of head: No tonsillar adenopathy.     Left side of head: No tonsillar adenopathy.     Cervical: No cervical adenopathy.  Skin:    Findings: No erythema or rash.     Nails: There is no clubbing.  Neurological:     Mental Status: She is alert.    Diagnostics:    Spirometry was performed and demonstrated an FEV1 of 3.73 at 113 % of predicted.  Assessment and Plan:   1. Asthma, moderate persistent, well-controlled   2. Perennial allergic rhinitis   3. Seasonal allergic rhinitis due to pollen   4. Pollen-food allergy, initial encounter     1.  Allergen avoidance measures -dust mite, cat, dog, pollens, cockroach    2.  Treat and prevent inflammation:  A. Symbicort 160 - 2 inhalations 1-2 times per day   B. Flonase - 1-2 sprays each nostril 1 time per day C. Montelukast 10 mg - 1 tablet 1 time per day D. Immunotherapy  3. If needed:  A. Fexofenadine 180 - 1 tablet 1 time per day B. Albuterol HFA - 2 inhalations 1 time per day C. Auvi-Q 0.3, benadryl, MD/ER evaluation for allergic reaction  4. Plan for fall flu vaccine  5. Return to clinic in 6 months or earlier if problem  [name] appears to be doing relatively well at this point in time and I am going to have her use Symbicort and Flonase at a dose that is  required depending on her disease activity and she will continue on montelukast on a regular basis and continue on immunotherapy.  I suspect that next year her situation will be significantly improved secondary to her immunotherapy and not only will her airway issue be improved but also her oral allergy syndrome situation.  I will see her back in this clinic in 6 months or earlier if there is a problem.  , MD Allergy / Immunology  [facility]

## 2020-11-22 ENCOUNTER — Encounter: Payer: Self-pay | Admitting: Allergy and Immunology

## 2020-11-23 ENCOUNTER — Ambulatory Visit (INDEPENDENT_AMBULATORY_CARE_PROVIDER_SITE_OTHER): Payer: BC Managed Care – PPO | Admitting: Addiction (Substance Use Disorder)

## 2020-11-23 ENCOUNTER — Other Ambulatory Visit: Payer: Self-pay

## 2020-11-23 DIAGNOSIS — F3341 Major depressive disorder, recurrent, in partial remission: Secondary | ICD-10-CM | POA: Diagnosis not present

## 2020-11-23 DIAGNOSIS — F4310 Post-traumatic stress disorder, unspecified: Secondary | ICD-10-CM
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
## 2020-12-07 ENCOUNTER — Other Ambulatory Visit: Payer: Self-pay | Admitting: Physician Assistant

## 2020-12-07 ENCOUNTER — Ambulatory Visit: Payer: BC Managed Care – PPO | Admitting: Addiction (Substance Use Disorder)

## 2020-12-08 ENCOUNTER — Ambulatory Visit (INDEPENDENT_AMBULATORY_CARE_PROVIDER_SITE_OTHER): Payer: BC Managed Care – PPO

## 2020-12-08 DIAGNOSIS — J309 Allergic rhinitis, unspecified: Secondary | ICD-10-CM

## 2020-12-14 ENCOUNTER — Encounter: Payer: Self-pay | Admitting: Physician Assistant

## 2020-12-14 ENCOUNTER — Ambulatory Visit (INDEPENDENT_AMBULATORY_CARE_PROVIDER_SITE_OTHER): Payer: BC Managed Care – PPO | Admitting: Physician Assistant

## 2020-12-14 ENCOUNTER — Other Ambulatory Visit: Payer: Self-pay

## 2020-12-14 DIAGNOSIS — F3281 Premenstrual dysphoric disorder: Secondary | ICD-10-CM | POA: Diagnosis not present

## 2020-12-14 DIAGNOSIS — R5383 Other fatigue: Secondary | ICD-10-CM

## 2020-12-14 DIAGNOSIS — F411 Generalized anxiety disorder: Secondary | ICD-10-CM | POA: Diagnosis not present

## 2020-12-14 DIAGNOSIS — F3341 Major depressive disorder, recurrent, in partial remission: Secondary | ICD-10-CM

## 2020-12-14 NOTE — Progress Notes (Signed)
[facility] Med Check  Patient ID: Sherry Adams,  MRN: [id number]  PCP: Ileana Ladd, MD  Date of Evaluation: 12/14/2020 Time spent:20 minutes  Chief Complaint:  Chief Complaint   Anxiety; Depression      HISTORY/CURRENT STATUS: HPI For routine med check.  Using alpha stem and it's very helpful. Doing better w/ depression. More energy/motivation, still has stress w/ her dtr w/ BPD but she's handling things better than she would have if she wasn't on the Wellbutrin.  Fatigue has really improved since being on the Wellbutrin.  Appetite has not changed.  No extreme sadness, tearfulness, or feelings of hopelessness.  Denies any changes in concentration, making decisions or remembering things.  Denies suicidal or homicidal thoughts.  Anxiety is well controlled. Rarely takes the Klonopin. But it is very helpful when needed. She recently took one before she went to [place] to meet some friends, and it made her more sleepy than it ever has. Didn't feel unsafe driving but wonders why it made her feel quite that way.  She had not had enough sleep the night before, maybe only 4 hours.  She had not eaten anything either.  Usually sleeps okay.  Patient denies increased energy with decreased need for sleep, no increased talkativeness, no racing thoughts, no impulsivity or risky behaviors, no increased spending, no increased libido, no grandiosity, no increased irritability or anger, and no hallucinations.  Denies dizziness, syncope, seizures, numbness, tingling, tremor, tics, unsteady gait, slurred speech, confusion. Denies muscle or joint pain, stiffness, or dystonia.  Individual Medical History/ Review of Systems: Changes? :No    Past medications for mental health diagnoses include: Zoloft, Effexor, Prozac, Klonopin, Wellbutrin  Allergies: Ibuprofen and Zyrtec [cetirizine]  Current Medications:  Current Outpatient Medications:    albuterol (ACCUNEB) 1.25 MG/3ML nebulizer solution, Take 3  mLs (1.25 mg total) by nebulization every 6 (six) hours as needed for wheezing., Disp: 100 mL, Rfl: 1   albuterol (VENTOLIN HFA) 108 (90 Base) MCG/ACT inhaler, Inhale 1 puff into the lungs every 6 (six) hours as needed for wheezing or shortness of breath., Disp: 18 g, Rfl: 1   budesonide-formoterol (SYMBICORT) 160-4.5 MCG/ACT inhaler, TAKE 2 PUFFS BY MOUTH TWICE A DAY, Disp: 30.6 each, Rfl: 1   buPROPion (WELLBUTRIN XL) 300 MG 24 hr tablet, Take 1 tablet (300 mg total) by mouth daily., Disp: 90 tablet, Rfl: 1   clonazePAM (KLONOPIN) 1 MG tablet, Take 1 tablet (1 mg total) by mouth 2 (two) times daily as needed for anxiety., Disp: 60 tablet, Rfl: 2   diphenhydrAMINE HCl (ALLERGY MEDICATION PO), Take by mouth., Disp: , Rfl:    fexofenadine-pseudoephedrine (ALLEGRA-D 24) 180-240 MG 24 hr tablet, 1 tablet, Disp: , Rfl:    FLUoxetine (PROZAC) 10 MG capsule, TAKE 1 CAPSULE BY MOUTH EVERY DAY ALONG WITH 20MG  CAPSULE TO =30MG  DAILY, Disp: 270 capsule, Rfl: 0   FLUoxetine (PROZAC) 20 MG capsule, TAKE 1 CAPSULE BY MOUTH DAILY, THEN INCREASE TO 2 DAILY THE WEEK BEFORE MENSES, Disp: 180 capsule, Rfl: 0   fluticasone (FLONASE) 50 MCG/ACT nasal spray, SPRAY 1 SPRAY INTO EACH NOSTRIL TWICE A DAY, Disp: , Rfl:    montelukast (SINGULAIR) 10 MG tablet, Take 10 mg by mouth at bedtime., Disp: , Rfl:    omeprazole (PRILOSEC) 20 MG capsule, Take 20 mg by mouth daily. Qod sometimes., Disp: , Rfl:    rosuvastatin (CRESTOR) 20 MG tablet, Take 20 mg by mouth daily. Take 1 tablet every other day, Disp: , Rfl:  Specialty Vitamins Products (MAGNESIUM, AMINO ACID CHELATE,) 133 MG tablet, Take 1 tablet by mouth 2 (two) times daily., Disp: , Rfl:  Medication Side Effects: none  Family Medical/ Social History: Changes?  No  MENTAL HEALTH EXAM:  There were no vitals taken for this visit.There is no height or weight on file to calculate BMI.  General Appearance: Casual, Neat and Well Groomed  Eye Contact:  Good  Speech:   Clear and Coherent and Normal Rate  Volume:  Normal  Mood:  Euthymic  Affect:  Appropriate  Thought Process:  Goal Directed and Descriptions of Associations: Circumstantial  Orientation:  Full (Time, Place, and Person)  Thought Content: Logical   Suicidal Thoughts:  No  Homicidal Thoughts:  No  Memory:  WNL  Judgement:  Good  Insight:  Good  Psychomotor Activity:  Normal  Concentration:  Concentration: Good and Attention Span: Good  Recall:  Good  Fund of Knowledge: Good  Language: Good  Assets:  Desire for Improvement  ADL's:  Intact  Cognition: WNL  Prognosis:  Good    DIAGNOSES:    ICD-10-CM   1. Recurrent major depressive disorder, in partial remission (HCC)  F33.41     2. PMDD (premenstrual dysphoric disorder)  F32.81     3. Generalized anxiety disorder  F41.1     4. Fatigue, unspecified type  R53.83        Receiving Psychotherapy: Yes   [name] Shutter, LCSW   RECOMMENDATIONS:  PDMP was reviewed.  Klonopin filled 06/27/2020.  No red flags. I provided 20 minutes of face to face time during this encounter, including time spent before and after the visit in records review, medical decision making, and charting.  I am glad to see her doing so much better! We discussed the Klonopin.  I think it hit her harder this time because she took it on an empty stomach and also had not slept well or long enough the night before she took it.  Recommend decreasing the dose.   Continue the Prozac as we discussed at the last visit.  When she starts having her mood change around the week before her menses should began, increase the Prozac at that time but if she does not have a regular cycle then stop the Prozac after about 5 days at the higher dose.  She understands. Continue Wellbutrin XL 300 mg,1 p.o. every morning. Continue Klonopin 1 mg, but take 1/4-1 pill twice a day as needed.  When she needs a refill, we will send in 0.5 mg.   Continue Prozac 30 mg every morning with then increased  to 40 mg daily 1 week prior to menses and then go back to 30 mg after her menses begins. Continue therapy with [name] Shutter, LCSW.  Return in 3 months.  Melony Overly, PA-C

## 2020-12-21 ENCOUNTER — Other Ambulatory Visit: Payer: Self-pay

## 2020-12-21 ENCOUNTER — Ambulatory Visit (INDEPENDENT_AMBULATORY_CARE_PROVIDER_SITE_OTHER): Payer: BC Managed Care – PPO | Admitting: Addiction (Substance Use Disorder)

## 2020-12-21 DIAGNOSIS — F509 Eating disorder, unspecified: Secondary | ICD-10-CM

## 2020-12-21 DIAGNOSIS — F4310 Post-traumatic stress disorder, unspecified: Secondary | ICD-10-CM | POA: Diagnosis not present
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
## 2020-12-22 ENCOUNTER — Ambulatory Visit (INDEPENDENT_AMBULATORY_CARE_PROVIDER_SITE_OTHER): Payer: BC Managed Care – PPO | Admitting: *Deleted

## 2020-12-22 DIAGNOSIS — J309 Allergic rhinitis, unspecified: Secondary | ICD-10-CM

## 2020-12-28 ENCOUNTER — Ambulatory Visit (INDEPENDENT_AMBULATORY_CARE_PROVIDER_SITE_OTHER): Payer: BC Managed Care – PPO | Admitting: Addiction (Substance Use Disorder)

## 2020-12-28 ENCOUNTER — Other Ambulatory Visit: Payer: Self-pay

## 2020-12-28 DIAGNOSIS — F4310 Post-traumatic stress disorder, unspecified: Secondary | ICD-10-CM

## 2020-12-28 NOTE — Progress Notes (Signed)
      [facility] Counselor/Therapist Progress Note  Patient ID: Sherry Adams, MRN: [id number],    Date: 12/28/2020  Time Spent:  Treatment Type: Individual Therapy  Reported Symptoms: angry but also fearful- triggered  Mental Status Exam:  Appearance:   Casual     Behavior:  Appropriate  Motor:  Normal  Speech/Language:   Clear and Coherent and Normal Rate  Affect:  Appropriate  Mood:  anxious and irritable  Thought process:  circumstantial  Thought content:    Obsessions and Rumination  Sensory/Perceptual disturbances:    WNL  Orientation:  x4  Attention:  Good  Concentration:  Good  Memory:  WNL  Fund of knowledge:   Good  Insight:    Good  Judgment:   Good  Impulse Control:  Good   Risk Assessment: Danger to Self:  No Self-injurious Behavior: No Danger to Others: No Duty to Warn:no Physical Aggression / Violence:No  Access to Firearms a concern: No  Gang Involvement:No   Subjective: Client reported frustration with her husband and feeling triggered by his changing the plans with parenting their kids. Client processed why they decided what they did and how she felt when he asked her to consider the change of plans. Client felt distressed and processed the feeling of fear underneath that. Therapist used MI & mindfulness to validate client's feeling and help her get into her body to notice the connections to the triggers to past events/feelings. Client used BSP with therapist and made progress processing the SUDs and feeling less embarrassed by her reaction and more validated in her saying no.  Therapist assessed for safety today and client denied SI/HI/AVH.   Interventions: Mindfulness Meditation and Motivational Interviewing & RPT & BSP  Diagnosis:   ICD-10-CM   1. PTSD (post-traumatic stress disorder)  F43.10        Plan: Mood independent behavior, positive self talk, self-care, assertiveness, boundaries Client to learn how to process grief and sadness  without a physical sensation.   [name], LCSW, LCAS, CCTP, CCS, BSP

## 2020-12-29 ENCOUNTER — Ambulatory Visit (INDEPENDENT_AMBULATORY_CARE_PROVIDER_SITE_OTHER): Payer: BC Managed Care – PPO

## 2020-12-29 DIAGNOSIS — J309 Allergic rhinitis, unspecified: Secondary | ICD-10-CM

## 2021-01-04 ENCOUNTER — Other Ambulatory Visit: Payer: Self-pay

## 2021-01-04 ENCOUNTER — Other Ambulatory Visit: Payer: Self-pay | Admitting: Physician Assistant

## 2021-01-04 ENCOUNTER — Ambulatory Visit (INDEPENDENT_AMBULATORY_CARE_PROVIDER_SITE_OTHER): Payer: BC Managed Care – PPO | Admitting: Addiction (Substance Use Disorder)

## 2021-01-04 DIAGNOSIS — F4310 Post-traumatic stress disorder, unspecified: Secondary | ICD-10-CM

## 2021-01-04 NOTE — Progress Notes (Signed)
      [facility] Counselor/Therapist Progress Note  Patient ID: Sherry Adams, MRN: [id number],    Date: 01/04/2021  Time Spent:  Treatment Type: Individual Therapy  Reported Symptoms: vulnerable  Mental Status Exam:  Appearance:   Casual     Behavior:  Appropriate  Motor:  Normal  Speech/Language:   Clear and Coherent and Normal Rate  Affect:  Appropriate  Mood:  anxious  Thought process:  circumstantial  Thought content:    Obsessions and Rumination  Sensory/Perceptual disturbances:    WNL  Orientation:  x4  Attention:  Adams  Concentration:  Adams  Memory:  WNL  Fund of knowledge:   Adams  Insight:    Adams  Judgment:   Adams  Impulse Control:  Fair   Risk Assessment: Danger to Self:  No Self-injurious Behavior: No Danger to Others: No Duty to Warn:no Physical Aggression / Violence:No  Access to Firearms a concern: No  Gang Involvement:No   Subjective: Client reported feeling really vulnerable at the end of last session when she felt out of control with frustration. Client wanted to be open and share that in hopes that she wouldn't feel judged by the therapist. Client identified the discomfort- flighty feeling she felt in her chest that made her feel like "she wanted to disappear in session". Therapist used MI, positive & supportive feedback, and BSP with client to validate client's feelings and help her feel safe to share her feelings. Client also used BSP to find internal brainspot to find a more stable place to process from and client's feeling in her chest calmed and she was able to be more present and process past experiences that made her feel so exposed. Therapist assessed for safety today and client denied SI/HI/AVH.   Interventions: Motivational Interviewing and supportive feedback  & RPT & BSP  Diagnosis:   ICD-10-CM   1. PTSD (post-traumatic stress disorder)  F43.10         Plan: Mood independent behavior, positive self talk, self-care,  assertiveness, boundaries Client to learn how to process grief and sadness without a physical sensation.   Sherry Good, LCSW, LCAS, CCTP, CCS, BSP

## 2021-01-05 ENCOUNTER — Ambulatory Visit (INDEPENDENT_AMBULATORY_CARE_PROVIDER_SITE_OTHER): Payer: BC Managed Care – PPO | Admitting: *Deleted

## 2021-01-05 DIAGNOSIS — J309 Allergic rhinitis, unspecified: Secondary | ICD-10-CM | POA: Diagnosis not present

## 2021-01-09 ENCOUNTER — Ambulatory Visit (INDEPENDENT_AMBULATORY_CARE_PROVIDER_SITE_OTHER): Payer: BC Managed Care – PPO | Admitting: Addiction (Substance Use Disorder)

## 2021-01-09 ENCOUNTER — Other Ambulatory Visit: Payer: Self-pay

## 2021-01-09 DIAGNOSIS — F4310 Post-traumatic stress disorder, unspecified: Secondary | ICD-10-CM
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
## 2021-01-16 ENCOUNTER — Ambulatory Visit (INDEPENDENT_AMBULATORY_CARE_PROVIDER_SITE_OTHER): Payer: BC Managed Care – PPO | Admitting: *Deleted

## 2021-01-16 ENCOUNTER — Ambulatory Visit (INDEPENDENT_AMBULATORY_CARE_PROVIDER_SITE_OTHER): Payer: BC Managed Care – PPO | Admitting: Addiction (Substance Use Disorder)

## 2021-01-16 ENCOUNTER — Other Ambulatory Visit: Payer: Self-pay

## 2021-01-16 DIAGNOSIS — J309 Allergic rhinitis, unspecified: Secondary | ICD-10-CM

## 2021-01-16 DIAGNOSIS — F509 Eating disorder, unspecified: Secondary | ICD-10-CM | POA: Diagnosis not present

## 2021-01-16 DIAGNOSIS — F4310 Post-traumatic stress disorder, unspecified: Secondary | ICD-10-CM | POA: Diagnosis not present
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
## 2021-01-17 ENCOUNTER — Ambulatory Visit (INDEPENDENT_AMBULATORY_CARE_PROVIDER_SITE_OTHER): Payer: BC Managed Care – PPO | Admitting: Addiction (Substance Use Disorder)

## 2021-01-17 DIAGNOSIS — F4310 Post-traumatic stress disorder, unspecified: Secondary | ICD-10-CM | POA: Diagnosis not present
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
## 2021-01-25 ENCOUNTER — Ambulatory Visit (INDEPENDENT_AMBULATORY_CARE_PROVIDER_SITE_OTHER): Payer: BC Managed Care – PPO | Admitting: *Deleted

## 2021-01-25 DIAGNOSIS — J309 Allergic rhinitis, unspecified: Secondary | ICD-10-CM | POA: Diagnosis not present

## 2021-01-30 ENCOUNTER — Ambulatory Visit (INDEPENDENT_AMBULATORY_CARE_PROVIDER_SITE_OTHER): Payer: BC Managed Care – PPO | Admitting: *Deleted

## 2021-01-30 DIAGNOSIS — J309 Allergic rhinitis, unspecified: Secondary | ICD-10-CM | POA: Diagnosis not present

## 2021-02-08 ENCOUNTER — Ambulatory Visit: Payer: BC Managed Care – PPO | Admitting: Addiction (Substance Use Disorder)

## 2021-02-13 ENCOUNTER — Ambulatory Visit: Payer: BC Managed Care – PPO | Admitting: Addiction (Substance Use Disorder)

## 2021-03-01 ENCOUNTER — Other Ambulatory Visit: Payer: Self-pay

## 2021-03-01 ENCOUNTER — Ambulatory Visit (INDEPENDENT_AMBULATORY_CARE_PROVIDER_SITE_OTHER): Payer: BC Managed Care – PPO | Admitting: Addiction (Substance Use Disorder)

## 2021-03-01 DIAGNOSIS — F411 Generalized anxiety disorder: Secondary | ICD-10-CM | POA: Diagnosis not present
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
## 2021-03-06 ENCOUNTER — Other Ambulatory Visit: Payer: Self-pay

## 2021-03-06 ENCOUNTER — Ambulatory Visit (INDEPENDENT_AMBULATORY_CARE_PROVIDER_SITE_OTHER): Payer: BC Managed Care – PPO | Admitting: Addiction (Substance Use Disorder)

## 2021-03-06 DIAGNOSIS — F4310 Post-traumatic stress disorder, unspecified: Secondary | ICD-10-CM
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
## 2021-03-14 ENCOUNTER — Ambulatory Visit: Payer: BC Managed Care – PPO | Admitting: Addiction (Substance Use Disorder)

## 2021-03-15 ENCOUNTER — Other Ambulatory Visit: Payer: Self-pay

## 2021-03-15 ENCOUNTER — Encounter: Payer: Self-pay | Admitting: Physician Assistant

## 2021-03-15 ENCOUNTER — Ambulatory Visit (INDEPENDENT_AMBULATORY_CARE_PROVIDER_SITE_OTHER): Payer: BC Managed Care – PPO | Admitting: Physician Assistant

## 2021-03-15 DIAGNOSIS — F331 Major depressive disorder, recurrent, moderate: Secondary | ICD-10-CM | POA: Diagnosis not present

## 2021-03-15 DIAGNOSIS — F4310 Post-traumatic stress disorder, unspecified: Secondary | ICD-10-CM

## 2021-03-15 DIAGNOSIS — R5383 Other fatigue: Secondary | ICD-10-CM

## 2021-03-15 DIAGNOSIS — F3281 Premenstrual dysphoric disorder: Secondary | ICD-10-CM

## 2021-03-15 DIAGNOSIS — F411 Generalized anxiety disorder: Secondary | ICD-10-CM | POA: Diagnosis not present

## 2021-03-15 MED ORDER — FLUOXETINE HCL 10 MG PO CAPS: ORAL_CAPSULE | ORAL | 3 refills | Status: DC

## 2021-03-15 MED ORDER — FLUOXETINE HCL 20 MG PO CAPS: ORAL_CAPSULE | ORAL | 3 refills | Status: DC

## 2021-03-15 MED ORDER — BUPROPION HCL ER (XL) 150 MG PO TB24: 450.0000 mg | ORAL_TABLET | Freq: Every day | ORAL | 1 refills | Status: DC

## 2021-03-15 MED ORDER — CLONAZEPAM 0.5 MG PO TABS: 0.5000 mg | ORAL_TABLET | Freq: Two times a day (BID) | ORAL | 2 refills | Status: DC | PRN

## 2021-03-15 NOTE — Progress Notes (Signed)
[facility]  Patient ID: Sherry Adams,  MRN: [id number]  PCP: Sherry Ladd, MD  Date of Evaluation: 03/15/2021 Time spent:30 minutes  Chief Complaint:  Chief Complaint   Anxiety; Depression; Follow-up       HISTORY/CURRENT STATUS: HPI For routine med check.  Had been doing well until the season changed and her daughter had to be admitted to [facility].  For a couple of months now she has felt kind of lethargic, having a hard time getting out of bed in the mornings, not motivated to do much either.  She is not crying easily and she does do things that she enjoys but still does not really feel like herself.  ADLs and personal hygiene are normal.  No suicidal or homicidal thoughts.  PMDD is much better with the addition of 10 mg extra of Prozac the week prior to menses.  Not having a lot of anxiety in general but when she does need the Klonopin it is helpful.  She is probably only taking it a couple of times a month.  She wonders if she may have some degree of attention deficit.  Is considering going to [facility] specialist.  States she has always had some level of trouble focusing and staying on task.  But she was never tested when she was a child.  Patient denies increased energy with decreased need for sleep, no increased talkativeness, no racing thoughts, no impulsivity or risky behaviors, no increased spending, no increased libido, no grandiosity, no increased irritability or anger, and no hallucinations.  Denies dizziness, syncope, seizures, numbness, tingling, tremor, tics, unsteady gait, slurred speech, confusion. Denies muscle or joint pain, stiffness, or dystonia.  Individual Medical History/ Review of Systems: Changes? :No    Past medications for mental health diagnoses include: Zoloft, Effexor, Prozac, Klonopin, Wellbutrin  Allergies: Ibuprofen and Zyrtec [cetirizine]  Current Medications:  Current Outpatient Medications:    albuterol  (ACCUNEB) 1.25 MG/3ML nebulizer solution, Take 3 mLs (1.25 mg total) by nebulization every 6 (six) hours as needed for wheezing., Disp: 100 mL, Rfl: 1   albuterol (VENTOLIN HFA) 108 (90 Base) MCG/ACT inhaler, Inhale 1 puff into the lungs every 6 (six) hours as needed for wheezing or shortness of breath., Disp: 18 g, Rfl: 1   budesonide-formoterol (SYMBICORT) 160-4.5 MCG/ACT inhaler, TAKE 2 PUFFS BY MOUTH TWICE A DAY, Disp: 30.6 each, Rfl: 1   buPROPion (WELLBUTRIN XL) 150 MG 24 hr tablet, Take 3 tablets (450 mg total) by mouth daily., Disp: 270 tablet, Rfl: 1   clonazePAM (KLONOPIN) 0.5 MG tablet, Take 1-2 tablets (0.5-1 mg total) by mouth 2 (two) times daily as needed for anxiety., Disp: 60 tablet, Rfl: 2   diphenhydrAMINE HCl (ALLERGY MEDICATION PO), Take by mouth., Disp: , Rfl:    fexofenadine-pseudoephedrine (ALLEGRA-D 24) 180-240 MG 24 hr tablet, 1 tablet, Disp: , Rfl:    fluticasone (FLONASE) 50 MCG/ACT nasal spray, SPRAY 1 SPRAY INTO EACH NOSTRIL TWICE A DAY, Disp: , Rfl:    montelukast (SINGULAIR) 10 MG tablet, Take 10 mg by mouth at bedtime., Disp: , Rfl:    omeprazole (PRILOSEC) 20 MG capsule, Take 20 mg by mouth daily. Qod sometimes., Disp: , Rfl:    rosuvastatin (CRESTOR) 20 MG tablet, Take 20 mg by mouth daily. Take 1 tablet every other day, Disp: , Rfl:    Specialty Vitamins Products (MAGNESIUM, AMINO ACID CHELATE,) 133 MG tablet, Take 1 tablet by mouth 2 (two) times daily., Disp: , Rfl:  FLUoxetine (PROZAC) 10 MG capsule, TAKE 1 CAPSULE BY MOUTH EVERY DAY ALONG WITH 20MG  CAPSULE TO =30MG  DAILY. Increase to 2 pills daily the week before menses. Then decrease back to 1 pill after menses., Disp: 180 capsule, Rfl: 3   FLUoxetine (PROZAC) 20 MG capsule, 1 po daily with the 10 mg=30 mg, Disp: 180 capsule, Rfl: 3 Medication Side Effects: none  Family Medical/ Social History: Changes?  No  MENTAL HEALTH EXAM:  There were no vitals taken for this visit.There is no height or weight on file  to calculate BMI.  General Appearance: Casual, Neat and Well Groomed  Eye Contact:  Good  Speech:  Clear and Coherent and Normal Rate  Volume:  Normal  Mood:  Euthymic  Affect:  Congruent  Thought Process:  Goal Directed and Descriptions of Associations: Circumstantial  Orientation:  Full (Time, Place, and Person)  Thought Content: Logical   Suicidal Thoughts:  No  Homicidal Thoughts:  No  Memory:  WNL  Judgement:  Good  Insight:  Good  Psychomotor Activity:  Normal  Concentration:  Concentration: Good and Attention Span: Good  Recall:  Good  Fund of Knowledge: Good  Language: Good  Assets:  Desire for Improvement  ADL's:  Intact  Cognition: WNL  Prognosis:  Good    DIAGNOSES:    ICD-10-CM   1. Major depressive disorder, recurrent episode, moderate (HCC)  F33.1     2. Generalized anxiety disorder  F41.1     3. PTSD (post-traumatic stress disorder)  F43.10     4. Fatigue, unspecified type  R53.83     5. PMDD (premenstrual dysphoric disorder)  F32.81         Receiving Psychotherapy: Yes   , LCSW   RECOMMENDATIONS:  PDMP was reviewed.  Klonopin filled 06/27/2020.  No red flags. I provided 30 minutes of face to face time during this encounter, including time spent before and after the visit in records review, medical decision making, counseling pertinent to today's visit, and charting.  Recommend increasing the Wellbutrin to help with energy and motivation.  She would like to do that. I think going to 08/27/2020 attention specialist for evaluation for ADD is a great idea.  I did let her know that if she prefers treatment for ADD here I am fine treating her but I would like her to be officially tested.  She understands. Increase Wellbutrin XL to a total of 450 mg daily. Decrease Klonopin to 0.5 mg 1/2-1 twice daily as needed. Continue Prozac 30 mg every morning with then increased to 40 mg daily 1 week prior to menses and then go back to 30 mg after her menses  begins. (I resent the 10 mg prescription twice, had to correct the directions and send it again and it went through to the pharmacy.) Continue therapy with [facility], LCSW.  Return in 6 to 8 weeks.  Sherry Adams Shutter, PA-C

## 2021-03-29 ENCOUNTER — Other Ambulatory Visit: Payer: Self-pay

## 2021-03-29 ENCOUNTER — Ambulatory Visit: Payer: BC Managed Care – PPO | Admitting: Addiction (Substance Use Disorder)

## 2021-03-29 DIAGNOSIS — F4310 Post-traumatic stress disorder, unspecified: Secondary | ICD-10-CM

## 2021-03-29 NOTE — Progress Notes (Signed)
°      [facility] Counselor/Therapist Progress Note  Patient ID: Sherry Adams, MRN: [id number],    Date: 03/29/2021  Time Spent:  Treatment Type: Individual Therapy  Reported Symptoms: feeling guilty  Mental Status Exam:  Appearance:   Casual     Behavior:  Appropriate  Motor:  Normal  Speech/Language:   Clear and Coherent and Normal Rate  Affect:  Appropriate  Mood:  anxious, labile, and sad  Thought process:  circumstantial  Thought content:    Obsessions and Rumination  Sensory/Perceptual disturbances:    WNL  Orientation:  x4  Attention:  Good  Concentration:  Good  Memory:  WNL  Fund of knowledge:   Good  Insight:    Good  Judgment:   Good  Impulse Control:  Fair   Risk Assessment: Danger to Self:  No Self-injurious Behavior: No Danger to Others: No Duty to Warn:no Physical Aggression / Violence:No  Access to Firearms a concern: No  Gang Involvement:No   Subjective: Client had a shortened appt due to therapist being double booked, so client' session is a no charge. Client expressed her struggle with talking with her brother who made her feel bad about lacking to pay for his therapy appts. Therapist and client used MI & BSP with client to validate her hurt feelings and frustrations and to help client process her sensations of guilt stuck in her gut that is upsetting her. Therapist assessed for safety today and client denied SI/HI/AVH.   Interventions: Cognitive Behavioral Therapy, Motivational Interviewing, and BSP  & RPT  Diagnosis:   ICD-10-CM   1. PTSD (post-traumatic stress disorder)  F43.10       Plan: Mood independent behavior, positive self talk, self-care, assertiveness, boundaries Client to learn how to process grief and sadness without a physical sensation.   [name], LCSW, LCAS, CCTP, CCS, BSP

## 2021-04-02 DIAGNOSIS — Z01411 Encounter for gynecological examination (general) (routine) with abnormal findings: Secondary | ICD-10-CM | POA: Diagnosis not present

## 2021-04-02 DIAGNOSIS — Z1231 Encounter for screening mammogram for malignant neoplasm of breast: Secondary | ICD-10-CM | POA: Diagnosis not present

## 2021-04-02 DIAGNOSIS — N926 Irregular menstruation, unspecified: Secondary | ICD-10-CM | POA: Diagnosis not present

## 2021-04-02 DIAGNOSIS — Z6826 Body mass index (BMI) 26.0-26.9, adult: Secondary | ICD-10-CM | POA: Diagnosis not present

## 2021-04-02 DIAGNOSIS — N644 Mastodynia: Secondary | ICD-10-CM | POA: Diagnosis not present

## 2021-04-06 ENCOUNTER — Ambulatory Visit (INDEPENDENT_AMBULATORY_CARE_PROVIDER_SITE_OTHER): Payer: BC Managed Care – PPO

## 2021-04-06 DIAGNOSIS — J309 Allergic rhinitis, unspecified: Secondary | ICD-10-CM

## 2021-04-10 ENCOUNTER — Ambulatory Visit (INDEPENDENT_AMBULATORY_CARE_PROVIDER_SITE_OTHER): Payer: BC Managed Care – PPO | Admitting: Addiction (Substance Use Disorder)

## 2021-04-10 ENCOUNTER — Other Ambulatory Visit: Payer: Self-pay

## 2021-04-10 DIAGNOSIS — F4310 Post-traumatic stress disorder, unspecified: Secondary | ICD-10-CM | POA: Diagnosis not present
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
## 2021-04-20 ENCOUNTER — Ambulatory Visit (INDEPENDENT_AMBULATORY_CARE_PROVIDER_SITE_OTHER): Payer: BC Managed Care – PPO

## 2021-04-20 DIAGNOSIS — J309 Allergic rhinitis, unspecified: Secondary | ICD-10-CM

## 2021-04-22 ENCOUNTER — Other Ambulatory Visit: Payer: Self-pay | Admitting: Physician Assistant

## 2021-05-01 ENCOUNTER — Encounter: Payer: Self-pay | Admitting: Allergy and Immunology

## 2021-05-01 ENCOUNTER — Other Ambulatory Visit: Payer: Self-pay

## 2021-05-01 ENCOUNTER — Ambulatory Visit: Payer: Self-pay | Admitting: *Deleted

## 2021-05-01 ENCOUNTER — Ambulatory Visit (INDEPENDENT_AMBULATORY_CARE_PROVIDER_SITE_OTHER): Payer: BC Managed Care – PPO | Admitting: Allergy and Immunology

## 2021-05-01 VITALS — BP 110/68 | HR 78 | Temp 98.7°F | Resp 16 | Ht 69.0 in

## 2021-05-01 DIAGNOSIS — T781XXA Other adverse food reactions, not elsewhere classified, initial encounter: Secondary | ICD-10-CM

## 2021-05-01 DIAGNOSIS — J4540 Moderate persistent asthma, uncomplicated: Secondary | ICD-10-CM

## 2021-05-01 DIAGNOSIS — J342 Deviated nasal septum: Secondary | ICD-10-CM

## 2021-05-01 DIAGNOSIS — J309 Allergic rhinitis, unspecified: Secondary | ICD-10-CM

## 2021-05-01 DIAGNOSIS — J301 Allergic rhinitis due to pollen: Secondary | ICD-10-CM

## 2021-05-01 DIAGNOSIS — J3089 Other allergic rhinitis: Secondary | ICD-10-CM

## 2021-05-01 MED ORDER — ALBUTEROL SULFATE 1.25 MG/3ML IN NEBU: 1.0000 | INHALATION_SOLUTION | Freq: Four times a day (QID) | RESPIRATORY_TRACT | 1 refills | Status: DC | PRN

## 2021-05-01 MED ORDER — ALBUTEROL SULFATE HFA 108 (90 BASE) MCG/ACT IN AERS: 1.0000 | INHALATION_SPRAY | Freq: Four times a day (QID) | RESPIRATORY_TRACT | 1 refills | Status: DC | PRN

## 2021-05-01 MED ORDER — FLUTICASONE PROPIONATE 50 MCG/ACT NA SUSP: 1.0000 | Freq: Every day | NASAL | 5 refills | Status: DC

## 2021-05-01 MED ORDER — OMEPRAZOLE 20 MG PO CPDR: 20.0000 mg | DELAYED_RELEASE_CAPSULE | Freq: Every day | ORAL | 1 refills | Status: DC

## 2021-05-01 MED ORDER — MONTELUKAST SODIUM 10 MG PO TABS: 10.0000 mg | ORAL_TABLET | Freq: Every day | ORAL | 1 refills | Status: DC

## 2021-05-01 MED ORDER — BUDESONIDE-FORMOTEROL FUMARATE 160-4.5 MCG/ACT IN AERO: INHALATION_SPRAY | RESPIRATORY_TRACT | 1 refills | Status: DC

## 2021-05-01 MED ORDER — FEXOFENADINE HCL 180 MG PO TABS: 180.0000 mg | ORAL_TABLET | Freq: Every day | ORAL | 1 refills | Status: DC

## 2021-05-01 NOTE — Progress Notes (Signed)
[place] [facility] [place] [facility] [place]   Follow-up Note  Referring Provider: Vernie Shanks, MD Primary Provider: Vernie Shanks, MD Date of Office Visit: 05/01/2021  Subjective:   Sherry Adams (DOB: 1970-09-11) is a 51 y.o. female who returns to the [facility] on 05/01/2021 in re-evaluation of the following:  HPI: Sherry Adams returns to this clinic in reevaluation of asthma and allergic rhinoconjunctivitis and oral allergy syndrome.  Her last visit to this clinic was 21 November 2020.  She has really done well with her airway and has not required a systemic steroid or antibiotic for any type of airway issue.  She did contract COVID in November 2022 which was manifested for the most part as an upper respiratory tract infection without any long-term sequela.  She can exercise without a problem and does not use a short acting bronchodilator.  She continues to use Symbicort and Flonase.  Currently she is using these agents 1 time per day.  She continues on immunotherapy currently at every week without any adverse effect.  Allergies as of 05/01/2021       Reactions   Dog Epithelium Allergy Skin Test Other (See Comments)   Ibuprofen    "bronchospams"   Zyrtec [cetirizine] Photosensitivity        Medication List    albuterol 108 (90 Base) MCG/ACT inhaler Commonly known as: VENTOLIN HFA Inhale 1 puff into the lungs every 6 (six) hours as needed for wheezing or shortness of breath.   albuterol 1.25 MG/3ML nebulizer solution Commonly known as: ACCUNEB Take 3 mLs (1.25 mg total) by nebulization every 6 (six) hours as needed for wheezing.   ALLERGY MEDICATION PO Take by mouth.   budesonide-formoterol 160-4.5 MCG/ACT inhaler Commonly known as: SYMBICORT TAKE 2 PUFFS BY MOUTH TWICE A DAY   buPROPion 150 MG 24 hr tablet Commonly known as: Wellbutrin XL Take 3 tablets (450 mg total) by mouth daily.   clonazePAM 0.5 MG tablet Commonly known as:  KlonoPIN Take 1-2 tablets (0.5-1 mg total) by mouth 2 (two) times daily as needed for anxiety.   fexofenadine-pseudoephedrine 180-240 MG 24 hr tablet Commonly known as: ALLEGRA-D 24 1 tablet   FLUoxetine 20 MG capsule Commonly known as: PROZAC 1 po daily with the 10 mg=30 mg   FLUoxetine 10 MG capsule Commonly known as: PROZAC TAKE 1 CAPSULE BY MOUTH EVERY DAY ALONG WITH 20MG  CAPSULE TO =30MG  DAILY. Increase to 2 pills daily the week before menses. Then decrease back to 1 pill after menses.   fluticasone 50 MCG/ACT nasal spray Commonly known as: FLONASE SPRAY 1 SPRAY INTO EACH NOSTRIL TWICE A DAY   magnesium (amino acid chelate) 133 MG tablet Take 1 tablet by mouth 2 (two) times daily.   montelukast 10 MG tablet Commonly known as: SINGULAIR Take 10 mg by mouth at bedtime.   omeprazole 20 MG capsule Commonly known as: PRILOSEC Take 20 mg by mouth daily. Qod sometimes.   rosuvastatin 20 MG tablet Commonly known as: CRESTOR Take 20 mg by mouth daily. Take 1 tablet every other day    Past Medical History:  Diagnosis Date   Asthma    Endometriosis     Past Surgical History:  Procedure Laterality Date   LAPAROSCOPY     for endometriosis   WISDOM TOOTH EXTRACTION      Review of systems negative except as noted in HPI / PMHx or noted below:  Review of Systems  Constitutional: Negative.  HENT: Negative.    Eyes: Negative.   Respiratory: Negative.    Cardiovascular: Negative.   Gastrointestinal: Negative.   Genitourinary: Negative.   Musculoskeletal: Negative.   Skin: Negative.   Neurological: Negative.   Endo/Heme/Allergies: Negative.   Psychiatric/Behavioral: Negative.      Objective:   Vitals:   05/01/21 1140  BP: 110/68  Pulse: 78  Resp: 16  Temp: 98.7 F (37.1 C)  SpO2: 97%   Height: 5\' 9"  (175.3 cm)      Physical Exam Constitutional:      Appearance: She is not diaphoretic.  HENT:     Head: Normocephalic.     Right Ear: Tympanic  membrane, ear canal and external ear normal.     Left Ear: Tympanic membrane, ear canal and external ear normal.     Nose: Septal deviation present. No mucosal edema or rhinorrhea.     Mouth/Throat:     Pharynx: Uvula midline. No oropharyngeal exudate.  Eyes:     Conjunctiva/sclera: Conjunctivae normal.  Neck:     Thyroid: No thyromegaly.     Trachea: Trachea normal. No tracheal tenderness or tracheal deviation.  Cardiovascular:     Rate and Rhythm: Normal rate and regular rhythm.     Heart sounds: Normal heart sounds, S1 normal and S2 normal. No murmur heard. Pulmonary:     Effort: No respiratory distress.     Breath sounds: Normal breath sounds. No stridor. No wheezing or rales.  Lymphadenopathy:     Head:     Right side of head: No tonsillar adenopathy.     Left side of head: No tonsillar adenopathy.     Cervical: No cervical adenopathy.  Skin:    Findings: No erythema or rash.     Nails: There is no clubbing.  Neurological:     Mental Status: She is alert.    Diagnostics: none  Assessment and Plan:   1. Asthma, moderate persistent, well-controlled   2. Perennial allergic rhinitis   3. Seasonal allergic rhinitis due to pollen   4. Pollen-food allergy, initial encounter   5. Deviated septum     1.  Allergen avoidance measures -dust mite, cat, dog, pollens, cockroach    2.  Treat and prevent inflammation:  A. Symbicort 160 - 2 inhalations 1-2 times per day   B. Flonase - 1-2 sprays each nostril 1 time per day C. Montelukast 10 mg - 1 tablet 1 time per day D. Immunotherapy  3. If needed:  A. Fexofenadine 180 - 1 tablet 1 time per day B. Albuterol HFA - 2 inhalations 1 time per day C. Auvi-Q 0.3, benadryl, MD/ER evaluation for allergic reaction  4. Return to clinic in 6 months or earlier if problem  Sherry Adams appears to be doing quite well.  She has a very good understanding of her disease state and how her medications work and she will alter the dose of her  medications directed against inflammation of her upper and lower airway depending on disease activity as noted above.  She will continue on immunotherapy which will make a long-term effect on her atopic respiratory disease and her oral allergy syndrome.  I will see her back in this clinic in 6 months or earlier if there is a problem.  Allena Katz, MD All[facility] / [facility]

## 2021-05-01 NOTE — Patient Instructions (Signed)
°  1.  Allergen avoidance measures -dust mite, cat, dog, pollens, cockroach    2.  Treat and prevent inflammation:  A. Symbicort 160 - 2 inhalations 1-2 times per day   B. Flonase - 1-2 sprays each nostril 1 time per day C. Montelukast 10 mg - 1 tablet 1 time per day D. Immunotherapy  3. If needed:  A. Fexofenadine 180 - 1 tablet 1 time per day B. Albuterol HFA - 2 inhalations 1 time per day C. Auvi-Q 0.3, benadryl, MD/ER evaluation for allergic reaction  4. Return to clinic in 6 months or earlier if problem

## 2021-05-02 ENCOUNTER — Encounter: Payer: Self-pay | Admitting: Allergy and Immunology

## 2021-05-15 ENCOUNTER — Other Ambulatory Visit: Payer: Self-pay

## 2021-05-15 ENCOUNTER — Ambulatory Visit (INDEPENDENT_AMBULATORY_CARE_PROVIDER_SITE_OTHER): Payer: BC Managed Care – PPO | Admitting: Physician Assistant

## 2021-05-15 ENCOUNTER — Encounter: Payer: Self-pay | Admitting: Physician Assistant

## 2021-05-15 DIAGNOSIS — F4310 Post-traumatic stress disorder, unspecified: Secondary | ICD-10-CM

## 2021-05-15 DIAGNOSIS — F411 Generalized anxiety disorder: Secondary | ICD-10-CM | POA: Diagnosis not present

## 2021-05-15 DIAGNOSIS — F3342 Major depressive disorder, recurrent, in full remission: Secondary | ICD-10-CM | POA: Diagnosis not present

## 2021-05-15 DIAGNOSIS — F509 Eating disorder, unspecified: Secondary | ICD-10-CM | POA: Diagnosis not present

## 2021-05-15 DIAGNOSIS — F3281 Premenstrual dysphoric disorder: Secondary | ICD-10-CM

## 2021-05-15 MED ORDER — BUPROPION HCL ER (XL) 150 MG PO TB24: 150.0000 mg | ORAL_TABLET | Freq: Every day | ORAL | 1 refills | Status: DC

## 2021-05-15 NOTE — Progress Notes (Signed)
[facility]  Patient ID: Devota Viruet,  MRN: [id number]  PCP: Ileana Ladd, MD  Date of Evaluation: 05/15/2021 Time spent:20 minutes  Chief Complaint:  Chief Complaint   Anxiety; Depression; Follow-up     HISTORY/CURRENT STATUS: HPI For routine med check.  At LOV we increased the Wellbutrin. But right after that, she accidentally missed a dose of the 300 mg and did not have a headache, she slept better, and felt good the next day.  She realized that the dose must be too high for her so she decreased it to 150 mg instead of increasing to 450 mg.  States she is feeling very well.  The chronic headache is gone, she is still sleeping better and has an overall sense of wellbeing that was not there before.  Stress at home has improved as well.  Her daughter that had been in [employer] health prior to our last visit is now doing well.  Patient denies loss of interest in usual activities and is able to enjoy things.  He is looking forward to going to [place], [place] in a few weeks to see a friend.  Denies decreased energy or motivation.  Appetite has not changed.  No extreme sadness, tearfulness, or feelings of hopelessness.  Does have problems focusing sometimes but no different.  Denies suicidal or homicidal thoughts.  She does have anxiety from time to time but states that the alpha stem is very helpful.  She does take the Klonopin as needed with relief as well.  Not having panic attacks so much as a generalized sense of unease, like something bad may happen.  She does not take the Klonopin on a daily basis by any means.  Symptoms of PMDD are much much better since being on the SSRI with increased dose prior to menses.  Patient denies increased energy with decreased need for sleep, no increased talkativeness, no racing thoughts, no impulsivity or risky behaviors, no increased spending, no increased libido, no grandiosity, no increased irritability or anger, and no  hallucinations.  Denies dizziness, syncope, seizures, numbness, tingling, tremor, tics, unsteady gait, slurred speech, confusion. Denies muscle or joint pain, stiffness, or dystonia.  Individual Medical History/ Review of Systems: Changes? :No    Past medications for mental health diagnoses include: Zoloft, Effexor, Prozac, Klonopin, Wellbutrin  Allergies: Dog epithelium allergy skin test, Ibuprofen, and Zyrtec [cetirizine]  Current Medications:  Current Outpatient Medications:    albuterol (ACCUNEB) 1.25 MG/3ML nebulizer solution, Take 3 mLs (1.25 mg total) by nebulization every 6 (six) hours as needed for wheezing., Disp: 100 mL, Rfl: 1   albuterol (VENTOLIN HFA) 108 (90 Base) MCG/ACT inhaler, Inhale 1 puff into the lungs every 6 (six) hours as needed for wheezing or shortness of breath., Disp: 18 g, Rfl: 1   budesonide-formoterol (SYMBICORT) 160-4.5 MCG/ACT inhaler, TAKE 2 PUFFS BY MOUTH TWICE A DAY, Disp: 30.6 each, Rfl: 1   Cholecalciferol (VITAMIN D) 10 MCG/ML LIQD, Take 500 Units by mouth daily at 12 noon., Disp: , Rfl:    clonazePAM (KLONOPIN) 0.5 MG tablet, Take 1-2 tablets (0.5-1 mg total) by mouth 2 (two) times daily as needed for anxiety., Disp: 60 tablet, Rfl: 2   diphenhydrAMINE HCl (ALLERGY MEDICATION PO), Take by mouth., Disp: , Rfl:    fexofenadine (ALLEGRA) 180 MG tablet, Take 1 tablet (180 mg total) by mouth daily., Disp: 90 tablet, Rfl: 1   FLUoxetine (PROZAC) 10 MG capsule, TAKE 1 CAPSULE BY MOUTH EVERY DAY ALONG WITH 20MG   CAPSULE TO =30MG  DAILY. Increase to 2 pills daily the week before menses. Then decrease back to 1 pill after menses., Disp: 180 capsule, Rfl: 3   FLUoxetine (PROZAC) 20 MG capsule, 1 po daily with the 10 mg=30 mg, Disp: 180 capsule, Rfl: 3   fluticasone (FLONASE) 50 MCG/ACT nasal spray, Place 1 spray into both nostrils daily., Disp: 16 g, Rfl: 5   montelukast (SINGULAIR) 10 MG tablet, Take 1 tablet (10 mg total) by mouth at bedtime., Disp: 90 tablet,  Rfl: 1   omeprazole (PRILOSEC) 20 MG capsule, Take 1 capsule (20 mg total) by mouth daily. Qod sometimes., Disp: 90 capsule, Rfl: 1   rosuvastatin (CRESTOR) 20 MG tablet, Take 20 mg by mouth daily. Take 1 tablet every other day, Disp: , Rfl:    Specialty Vitamins Products (MAGNESIUM, AMINO ACID CHELATE,) 133 MG tablet, Take 1 tablet by mouth 2 (two) times daily., Disp: , Rfl:    buPROPion (WELLBUTRIN XL) 150 MG 24 hr tablet, Take 1 tablet (150 mg total) by mouth daily., Disp: 90 tablet, Rfl: 1 Medication Side Effects: none  Family Medical/ Social History: Changes?  No  MENTAL HEALTH EXAM:  There were no vitals taken for this visit.There is no height or weight on file to calculate BMI.  General Appearance: Casual, Neat and Well Groomed  Eye Contact:  Good  Speech:  Clear and Coherent and Normal Rate  Volume:  Normal  Mood:  Euthymic  Affect:  Congruent  Thought Process:  Goal Directed and Descriptions of Associations: Circumstantial  Orientation:  Full (Time, Place, and Person)  Thought Content: Logical   Suicidal Thoughts:  No  Homicidal Thoughts:  No  Memory:  WNL  Judgement:  Good  Insight:  Good  Psychomotor Activity:  Normal  Concentration:  Concentration: Fair and Attention Span: Good  Recall:  Good  Fund of Knowledge: Good  Language: Good  Assets:  Desire for Improvement  ADL's:  Intact  Cognition: WNL  Prognosis:  Good    DIAGNOSES:    ICD-10-CM   1. Recurrent major depressive disorder, in full remission (HCC)  F33.42     2. PTSD (post-traumatic stress disorder)  F43.10     3. Generalized anxiety disorder  F41.1     4. Eating disorder, unspecified type  F50.9     5. PMDD (premenstrual dysphoric disorder)  F32.81        Receiving Psychotherapy: Yes   [name] Shutter, LCSW   RECOMMENDATIONS:  PDMP was reviewed.  Klonopin filled 03/15/2021. I provided 20 minutes of face to face time during this encounter, including time spent before and after the visit in  records review, medical decision making, counseling pertinent to today's visit, and charting.  I am glad to see that she is doing better!  Will keep the Wellbutrin XL at the lower dose since she is responding well and is not having the physical symptoms now.  No changes in meds are necessary.  Continue Wellbutrin XL 150 mg, 1 p.o. every morning.   Continue Klonopin 0.5 mg 1/2-1 twice daily as needed. Continue Prozac 30 mg every morning with then increased to 40 mg daily 1 week prior to menses and then go back to 30 mg after her menses begins. Continue therapy with [name] Shutter, LCSW.  Return in 6 months  Melony Overly, [place]

## 2021-05-17 ENCOUNTER — Ambulatory Visit (INDEPENDENT_AMBULATORY_CARE_PROVIDER_SITE_OTHER): Payer: BC Managed Care – PPO | Admitting: *Deleted

## 2021-05-17 DIAGNOSIS — J309 Allergic rhinitis, unspecified: Secondary | ICD-10-CM | POA: Diagnosis not present

## 2021-05-24 ENCOUNTER — Other Ambulatory Visit: Payer: Self-pay

## 2021-05-24 ENCOUNTER — Ambulatory Visit (INDEPENDENT_AMBULATORY_CARE_PROVIDER_SITE_OTHER): Payer: BC Managed Care – PPO | Admitting: Addiction (Substance Use Disorder)

## 2021-05-24 DIAGNOSIS — F3342 Major depressive disorder, recurrent, in full remission: Secondary | ICD-10-CM

## 2021-05-24 NOTE — Progress Notes (Signed)
?      [facility] Counselor/Therapist Progress Note ? ?Patient ID: Sherry Adams, MRN: [id number],   ? ?Date: 05/24/2021 ? ?Time Spent: ? ?Treatment Type: Individual Therapy ? ?Reported Symptoms: anxiety, body image issues continuing, distractibility. ? ?Mental Status Exam: ? ?Appearance:   Casual     ?Behavior:  Appropriate  ?Motor:  Normal  ?Speech/Language:   Clear and Coherent and Normal Rate  ?Affect:  Appropriate  ?Mood:  anxious and labile  ?Thought process:  circumstantial  ?Thought content:    Obsessions and Rumination  ?Sensory/Perceptual disturbances:    WNL  ?Orientation:  x4  ?Attention:  Good  ?Concentration:  Fair  ?Memory:  WNL  ?Fund of knowledge:   Good  ?Insight:    Good  ?Judgment:   Good  ?Impulse Control:  Fair  ? ?Risk Assessment: ?Danger to Self:  No ?Self-injurious Behavior: No ?Danger to Others: No ?Adams to Warn:no ?Physical Aggression / Violence:No  ?Access to Firearms a concern: No  ?Gang Involvement:No  ? ?Subjective: Client processed increased anxiety and nervousness. Client having a lot of distractibility and needed support for concentration and focus. Client likes to organize but struggles to execute an activity or house project. Client looking to get treatment for some of her ADD. Client reported feeling frazzled and anxious today. Client processed a lack of healthy relationship with her family members and how transactional things are for them. Client had to speak with her mom about something she needs and it dysregulated her. Therapist used MI & mindfulness with clien to validate her grief and frustration and help her ground emotionally. Therapist assessed for safety today and client denied SI/HI/AVH.  ? ?Interventions: Cognitive Behavioral Therapy, Mindfulness Meditation, and Motivational Interviewing & RPT ? ?Diagnosis: ?  ICD-10-CM   ?1. Recurrent major depressive disorder, in full remission (HCC)  F33.42   ?  ? ? ? ?Plan: Mood independent behavior, positive self talk,  self-care, assertiveness, boundaries ?Client to learn how to process grief and sadness without a physical sensation.  ? ?[name], LCSW, LCAS, CCTP, CCS, BSP ? ? ? ? ? ? ? ? ? ? ? ? ? ? ? ? ? ?

## 2021-05-29 ENCOUNTER — Ambulatory Visit (INDEPENDENT_AMBULATORY_CARE_PROVIDER_SITE_OTHER): Payer: BC Managed Care – PPO

## 2021-05-29 DIAGNOSIS — J309 Allergic rhinitis, unspecified: Secondary | ICD-10-CM

## 2021-06-07 ENCOUNTER — Other Ambulatory Visit: Payer: Self-pay

## 2021-06-07 ENCOUNTER — Ambulatory Visit (INDEPENDENT_AMBULATORY_CARE_PROVIDER_SITE_OTHER): Payer: BC Managed Care – PPO | Admitting: *Deleted

## 2021-06-07 ENCOUNTER — Ambulatory Visit (INDEPENDENT_AMBULATORY_CARE_PROVIDER_SITE_OTHER): Payer: BC Managed Care – PPO | Admitting: Addiction (Substance Use Disorder)

## 2021-06-07 DIAGNOSIS — F4310 Post-traumatic stress disorder, unspecified: Secondary | ICD-10-CM

## 2021-06-07 DIAGNOSIS — J309 Allergic rhinitis, unspecified: Secondary | ICD-10-CM | POA: Diagnosis not present

## 2021-06-07 NOTE — Progress Notes (Signed)
?      [facility] Counselor/Therapist Progress Note ? ?Patient ID: Sherry Adams, MRN: [id number],   ? ?Date: 06/07/2021 ? ?Time Spent: 55 mins ? ?Treatment Type: Individual Therapy ? ?Reported Symptoms: awkward, uncomfortable.  ? ?Mental Status Exam: ? ?Appearance:   Casual     ?Behavior:  Appropriate  ?Motor:  Normal  ?Speech/Language:   Clear and Coherent and Normal Rate  ?Affect:  Appropriate  ?Mood:  anxious and labile  ?Thought process:  circumstantial  ?Thought content:    Obsessions and Rumination  ?Sensory/Perceptual disturbances:    WNL  ?Orientation:  x4  ?Attention:  Good  ?Concentration:  Fair  ?Memory:  WNL  ?Fund of knowledge:   Good  ?Insight:    Good  ?Judgment:   Good  ?Impulse Control:  Fair  ? ?Risk Assessment: ?Danger to Self:  No ?Self-injurious Behavior: No ?Danger to Others: No ?Duty to Warn:no ?Physical Aggression / Violence:No  ?Access to Firearms a concern: No  ?Gang Involvement:No  ? ?Subjective: Client processed feeling awkward when she went to visit a friend. Client frustrated because her friend expects others to pay for everything and doesn't feel consequences-- like her family expects to do bad things and not expect consequences. Therapist used MI & CBT with client to validate that connection and trigger for the client and support her as she processed the difficult feelings and thoughts about her family and the real triggers. Client feeling like when she overgives to her friend in [place], it brings her back to her chaotic family childhood where everything felt like it was taken from her- like her innocence. Therapist used bsping to help client reduce her distress internally and ground emotionally. Therapist assessed for safety today and client denied SI/HI/AVH.  ? ?Interventions: Cognitive Behavioral Therapy, Motivational Interviewing, and BSP  & RPT ? ?Diagnosis: ?No diagnosis found. ? ? ?Plan: Mood independent behavior, positive self talk, self-care, assertiveness, boundaries ?Client to  learn how to process grief and sadness without a physical sensation.  ? ?[name], LCSW, LCAS, CCTP, CCS, BSP ? ? ? ? ? ? ? ? ? ? ? ? ? ? ? ? ? ?

## 2021-06-17 ENCOUNTER — Other Ambulatory Visit: Payer: Self-pay | Admitting: Allergy and Immunology

## 2021-06-19 ENCOUNTER — Ambulatory Visit (INDEPENDENT_AMBULATORY_CARE_PROVIDER_SITE_OTHER): Payer: BC Managed Care – PPO

## 2021-06-19 DIAGNOSIS — J309 Allergic rhinitis, unspecified: Secondary | ICD-10-CM

## 2021-06-21 ENCOUNTER — Ambulatory Visit: Payer: BC Managed Care – PPO | Admitting: Addiction (Substance Use Disorder)

## 2021-06-26 ENCOUNTER — Ambulatory Visit (INDEPENDENT_AMBULATORY_CARE_PROVIDER_SITE_OTHER): Payer: BC Managed Care – PPO | Admitting: Addiction (Substance Use Disorder)

## 2021-06-26 DIAGNOSIS — F4310 Post-traumatic stress disorder, unspecified: Secondary | ICD-10-CM

## 2021-06-26 NOTE — Progress Notes (Signed)
?      [facility] Counselor/Therapist Progress Note ? ?Patient ID: Sherry Adams, MRN: [id number],   ? ?Date: 06/26/2021 ? ?Time Spent: ? ?Treatment Type: Individual Therapy ? ?Reported Symptoms: triggered, sad/grieving the abandonment of her family ? ?Mental Status Exam: ? ?Appearance:   Casual     ?Behavior:  Appropriate  ?Motor:  Normal  ?Speech/Language:   Clear and Coherent and Normal Rate  ?Affect:  Appropriate  ?Mood:  labile and sad  ?Thought process:  normal  ?Thought content:    Obsessions and Rumination  ?Sensory/Perceptual disturbances:    WNL  ?Orientation:  x4  ?Attention:  Good  ?Concentration:  Fair  ?Memory:  WNL  ?Fund of knowledge:   Good  ?Insight:    Good  ?Judgment:   Good  ?Impulse Control:  Fair  ? ?Risk Assessment: ?Danger to Self:  No ?Self-injurious Behavior: No ?Danger to Others: No ?Duty to Warn:no ?Physical Aggression / Violence:No  ?Access to Firearms a concern: No  ?Gang Involvement:No  ? ?Subjective: Client processed feeling triggered, sad/grieving the abandonment of her family. Client reported having gone to a training about the family of origin and having gotten flashbacks about her childhood that were traumatic. Therapist used MI to validate clients feelings and affirm her strengths of being able to pull away from her toxic family who was harming her. Therapist also used BSP with client to help client process the pain and grief of that loss and to help her ground emotionally after taking that class that stirred things up for her. Therapist assessed for safety today and client denied SI/HI/AVH.  ? ?Interventions: Cognitive Behavioral Therapy, Motivational Interviewing, and BSP  & RPT ? ?Diagnosis: ?  ICD-10-CM   ?1. PTSD (post-traumatic stress disorder)  F43.10   ?  ? ? ?Plan: Mood independent behavior, positive self talk, self-care, assertiveness, boundaries ?Client to learn how to process grief and sadness without a physical sensation.  ? ?[name], LCSW, LCAS, CCTP,  CCS, BSP ? ? ? ? ? ? ? ? ? ? ? ? ? ? ? ? ? ?

## 2021-06-28 ENCOUNTER — Ambulatory Visit (INDEPENDENT_AMBULATORY_CARE_PROVIDER_SITE_OTHER): Payer: BC Managed Care – PPO

## 2021-06-28 DIAGNOSIS — J309 Allergic rhinitis, unspecified: Secondary | ICD-10-CM | POA: Diagnosis not present

## 2021-06-29 ENCOUNTER — Other Ambulatory Visit: Payer: Self-pay | Admitting: Physician Assistant

## 2021-07-05 ENCOUNTER — Ambulatory Visit (INDEPENDENT_AMBULATORY_CARE_PROVIDER_SITE_OTHER): Payer: BC Managed Care – PPO | Admitting: Addiction (Substance Use Disorder)

## 2021-07-05 DIAGNOSIS — F3281 Premenstrual dysphoric disorder: Secondary | ICD-10-CM

## 2021-07-05 NOTE — Progress Notes (Signed)
?      [facility] Counselor/Therapist Progress Note ? ?Patient ID: Sherry Adams, MRN: [id number],   ? ?Date: 07/05/2021 ? ?Time Spent: 3mins ? ?Treatment Type: Individual Therapy ? ?Reported Symptoms: sad, emotional, weepy ? ?Mental Status Exam: ? ?Appearance:   Casual     ?Behavior:  Appropriate  ?Motor:  Normal  ?Speech/Language:   Clear and Coherent and Normal Rate  ?Affect:  Appropriate  ?Mood:  labile and sad  ?Thought process:  normal  ?Thought content:    Obsessions and Rumination  ?Sensory/Perceptual disturbances:    WNL  ?Orientation:  x4  ?Attention:  Good  ?Concentration:  Fair  ?Memory:  WNL  ?Fund of knowledge:   Good  ?Insight:    Good  ?Judgment:   Good  ?Impulse Control:  Fair  ? ?Risk Assessment: ?Danger to Self:  No ?Self-injurious Behavior: No ?Danger to Others: No ?Duty to Warn:no ?Physical Aggression / Violence:No  ?Access to Firearms a concern: No  ?Gang Involvement:No  ? ?Subjective: Client processed feeling weepy, sad, and triggered. Client reported getting a new diagnosis of PMDD due to her intense mood swings and changes prior to her menses. Client processed feeling saddened by her lack of contact with a healthy family of origin due to the toxic family she has and her need to have created boundaries to have space from them. Therapist used MI & Grief therappy to support her as she processed the pain and BSP to help her further process the feeling of that loss, pain and sadness. Client made progress reducing the SUDs in her chest.Therapist assessed for safety today and client denied SI/HI/AVH.  ? ?Interventions: Motivational Interviewing, Grief Therapy, and BSP  & RPT ? ?Diagnosis: ?  ICD-10-CM   ?1. PMDD (premenstrual dysphoric disorder)  F32.81   ?  ? ? ? ?Plan: Mood independent behavior, positive self talk, self-care, assertiveness, boundaries ?Client to learn how to process grief and sadness without a physical sensation.  ? ?Barnie Del, LCSW, LCAS, CCTP, CCS,  BSP ? ? ? ? ? ? ? ? ? ? ? ? ? ? ? ? ? ?

## 2021-07-06 ENCOUNTER — Ambulatory Visit (INDEPENDENT_AMBULATORY_CARE_PROVIDER_SITE_OTHER): Payer: BC Managed Care – PPO

## 2021-07-06 DIAGNOSIS — J309 Allergic rhinitis, unspecified: Secondary | ICD-10-CM

## 2021-07-09 DIAGNOSIS — F411 Generalized anxiety disorder: Secondary | ICD-10-CM | POA: Diagnosis not present

## 2021-07-11 DIAGNOSIS — E782 Mixed hyperlipidemia: Secondary | ICD-10-CM | POA: Diagnosis not present

## 2021-07-11 DIAGNOSIS — J45998 Other asthma: Secondary | ICD-10-CM | POA: Diagnosis not present

## 2021-07-11 DIAGNOSIS — F410 Panic disorder [episodic paroxysmal anxiety] without agoraphobia: Secondary | ICD-10-CM | POA: Diagnosis not present

## 2021-07-11 DIAGNOSIS — K121 Other forms of stomatitis: Secondary | ICD-10-CM | POA: Diagnosis not present

## 2021-07-13 ENCOUNTER — Ambulatory Visit (INDEPENDENT_AMBULATORY_CARE_PROVIDER_SITE_OTHER): Payer: BC Managed Care – PPO | Admitting: *Deleted

## 2021-07-13 DIAGNOSIS — J309 Allergic rhinitis, unspecified: Secondary | ICD-10-CM | POA: Diagnosis not present

## 2021-07-16 DIAGNOSIS — F411 Generalized anxiety disorder: Secondary | ICD-10-CM | POA: Diagnosis not present

## 2021-07-19 ENCOUNTER — Ambulatory Visit (INDEPENDENT_AMBULATORY_CARE_PROVIDER_SITE_OTHER): Payer: BC Managed Care – PPO | Admitting: Addiction (Substance Use Disorder)

## 2021-07-19 DIAGNOSIS — F411 Generalized anxiety disorder: Secondary | ICD-10-CM

## 2021-07-19 NOTE — Progress Notes (Signed)
?      [facility] Counselor/Therapist Progress Note ? ?Patient ID: Sherry Adams, MRN: [id number],   ? ?Date: 07/19/2021 ? ?Time Spent: 53mins ? ?Treatment Type: Individual Therapy ? ?Reported Symptoms: so afraid, worried, upset ? ?Mental Status Exam: ? ?Appearance:   Casual     ?Behavior:  Appropriate  ?Motor:  Normal  ?Speech/Language:   Clear and Coherent and Normal Rate  ?Affect:  Appropriate  ?Mood:  angry, anxious, and labile  ?Thought process:  normal  ?Thought content:    Obsessions and Rumination  ?Sensory/Perceptual disturbances:    WNL  ?Orientation:  x4  ?Attention:  Good  ?Concentration:  Fair  ?Memory:  WNL  ?Fund of knowledge:   Good  ?Insight:    Good  ?Judgment:   Fair  ?Impulse Control:  Good  ? ?Risk Assessment: ?Danger to Self:  No ?Self-injurious Behavior: No ?Danger to Others: No ?Duty to Warn:no ?Physical Aggression / Violence:No  ?Access to Firearms a concern: No  ?Gang Involvement:No  ? ?Subjective: Client processed feeling labile and not well this week. Client reported feeling very afraid and worried about a huge upcoming expense to pay for her brother who is caring for her mom. Client worried they will be strapped for money due to this cost but feels responsible to try to take care of her mom so she doesn't end up in a homeless shelter. Client angry with her moms lack of a stable childhood for client, but unwilling to leave her on her own. Client processed using MI & BSP with therapist, processing her frustration about this situation and the need for "the family to take care of the financial costs and ... not the state/government with free healthcare and nursing coverage".Therapist assessed for safety today and client denied SI/HI/AVH.  ? ?Interventions: Cognitive Behavioral Therapy, Motivational Interviewing, and BSP  & RPT ? ?Diagnosis: ?No diagnosis found. ? ? ?Plan: Mood independent behavior, positive self talk, self-care, assertiveness, boundaries ?Client to learn how to process grief  and sadness without a physical sensation.  ? ?Barnie Del, LCSW, LCAS, CCTP, CCS, BSP ? ? ? ? ? ? ? ? ? ? ? ? ? ? ? ? ? ?

## 2021-07-23 DIAGNOSIS — F411 Generalized anxiety disorder: Secondary | ICD-10-CM | POA: Diagnosis not present

## 2021-07-25 ENCOUNTER — Ambulatory Visit (INDEPENDENT_AMBULATORY_CARE_PROVIDER_SITE_OTHER): Payer: BC Managed Care – PPO

## 2021-07-25 DIAGNOSIS — J309 Allergic rhinitis, unspecified: Secondary | ICD-10-CM | POA: Diagnosis not present

## 2021-07-30 DIAGNOSIS — R519 Headache, unspecified: Secondary | ICD-10-CM | POA: Diagnosis not present

## 2021-07-31 DIAGNOSIS — D2262 Melanocytic nevi of left upper limb, including shoulder: Secondary | ICD-10-CM | POA: Diagnosis not present

## 2021-07-31 DIAGNOSIS — D2261 Melanocytic nevi of right upper limb, including shoulder: Secondary | ICD-10-CM | POA: Diagnosis not present

## 2021-07-31 DIAGNOSIS — L918 Other hypertrophic disorders of the skin: Secondary | ICD-10-CM | POA: Diagnosis not present

## 2021-07-31 DIAGNOSIS — D225 Melanocytic nevi of trunk: Secondary | ICD-10-CM | POA: Diagnosis not present

## 2021-08-02 ENCOUNTER — Ambulatory Visit (INDEPENDENT_AMBULATORY_CARE_PROVIDER_SITE_OTHER): Payer: BC Managed Care – PPO | Admitting: Addiction (Substance Use Disorder)

## 2021-08-02 DIAGNOSIS — F411 Generalized anxiety disorder: Secondary | ICD-10-CM | POA: Diagnosis not present

## 2021-08-02 NOTE — Progress Notes (Signed)
?      [facility] Counselor/Therapist Progress Note ? ?Patient ID: Sherry Adams, MRN: [id number],   ? ?Date: 08/02/2021 ? ?Time Spent: 35mins ? ?Treatment Type: Individual Therapy ? ?Reported Symptoms: stressed, tense, in pain ? ?Mental Status Exam: ? ?Appearance:   Casual     ?Behavior:  Appropriate  ?Motor:  Normal  ?Speech/Language:   Clear and Coherent and Normal Rate  ?Affect:  Appropriate  ?Mood:  anxious  ?Thought process:  normal  ?Thought content:    Obsessions and Rumination  ?Sensory/Perceptual disturbances:    WNL  ?Orientation:  x4  ?Attention:  Good  ?Concentration:  Fair  ?Memory:  WNL  ?Fund of knowledge:   Good  ?Insight:    Good  ?Judgment:   Fair  ?Impulse Control:  Good  ? ?Risk Assessment: ?Danger to Self:  No ?Self-injurious Behavior: No ?Danger to Others: No ?Duty to Warn:no ?Physical Aggression / Violence:No  ?Access to Firearms a concern: No  ?Gang Involvement:No  ? ?Subjective: Client processed feeling in pain due to her migraines these days and going to a doctor who discounted her self report and personal history, making her feel not believed. Client was luckily referred to a specialist to help her get relief. Client processed some stressors and how she is handling them by setting and keeping her own boundaries. Therapist used MI to affirm client for setting and keeping those and help use CBT to challenge her feelings of  guilt or codependence when she thinks she offended one of her clients with her boundaries. Therapist assessed for safety today and client denied SI/HI/AVH.  ? ?Interventions: Cognitive Behavioral Therapy and Motivational Interviewing & RPT ? ?Diagnosis: ?  ICD-10-CM   ?1. Generalized anxiety disorder  F41.1   ?  ? ? ? ?Plan: Mood independent behavior, keeping boundaries without guilt, positive self talk, self-care, assertiveness ?Client to learn how to process grief and sadness without a physical sensation.  ? ?Barnie Del, LCSW, LCAS, CCTP, CCS,  BSP ? ? ? ? ? ? ? ? ? ? ? ? ? ? ? ? ? ?

## 2021-08-03 ENCOUNTER — Ambulatory Visit (INDEPENDENT_AMBULATORY_CARE_PROVIDER_SITE_OTHER): Payer: BC Managed Care – PPO

## 2021-08-03 DIAGNOSIS — J309 Allergic rhinitis, unspecified: Secondary | ICD-10-CM | POA: Diagnosis not present

## 2021-08-06 DIAGNOSIS — F411 Generalized anxiety disorder: Secondary | ICD-10-CM | POA: Diagnosis not present

## 2021-08-07 ENCOUNTER — Ambulatory Visit (INDEPENDENT_AMBULATORY_CARE_PROVIDER_SITE_OTHER): Payer: BC Managed Care – PPO

## 2021-08-07 DIAGNOSIS — J309 Allergic rhinitis, unspecified: Secondary | ICD-10-CM

## 2021-08-13 DIAGNOSIS — F411 Generalized anxiety disorder: Secondary | ICD-10-CM | POA: Diagnosis not present

## 2021-08-21 ENCOUNTER — Encounter: Payer: Self-pay | Admitting: *Deleted

## 2021-08-21 ENCOUNTER — Other Ambulatory Visit: Payer: Self-pay | Admitting: *Deleted

## 2021-08-22 ENCOUNTER — Ambulatory Visit (INDEPENDENT_AMBULATORY_CARE_PROVIDER_SITE_OTHER): Payer: BC Managed Care – PPO

## 2021-08-22 DIAGNOSIS — J309 Allergic rhinitis, unspecified: Secondary | ICD-10-CM | POA: Diagnosis not present

## 2021-08-22 NOTE — Progress Notes (Unsigned)
Referring:  Clayborn Heron, MD [address] [place],  [place] [address]  PCP: Ileana Ladd, MD  Neurology was asked to evaluate Sherry Adams, a 51 year old female for a chief complaint of headaches.  Our recommendations of care will be communicated by shared medical record.    CC:  headaches  History provided from self  HPI:  Medical co-morbidities: depression, GAD, asthma  The patient presents for evaluation of headaches. She has had migraines since high school, but they have worsened over the past 8 months. Currently she has headaches ~20 days out of the month. Headaches are described as throbbing pain with associated photophobia, phonophobia, and nausea. She will rarely see spots in her vision as well. Headaches can last for several days at a time. Takes Tylenol as needed which is only somewhat effective.  She is also having headaches associated with intercourse. The pain slowly builds during sex and peaks during orgasm. These headaches can last for up to 2-3 days.  She is perimenopausal and feels headaches have worsened with perimenopause.  Headache History: Onset: high school Triggers: sex, lack of sleep, stress Aura: spots Location: left temple Quality/Description: throbbing Associated Symptoms:  Photophobia: yes  Phonophobia: yes  Nausea: yes Worse with activity?: yes Duration of headaches: several days  Headache days per month: 20 Headache free days per month: 10  Current Treatment: Abortive Tylenol  Preventative none  Prior Therapies                                 Tylenol Tramadol Prozac Magnesium   LABS: CBC    Component Value Date/Time   WBC 10.2 10/22/2016 1655   RBC 4.69 10/22/2016 1655   HGB 13.6 10/22/2016 1655   HCT 40.6 10/22/2016 1655   PLT 290.0 10/22/2016 1655   MCV 86.7 10/22/2016 1655   MCHC 33.6 10/22/2016 1655   RDW 13.3 10/22/2016 1655   LYMPHSABS 3.6 10/22/2016 1655   MONOABS 0.9 10/22/2016 1655   EOSABS 0.3  10/22/2016 1655   BASOSABS 0.1 10/22/2016 1655    IMAGING:  none  Current Outpatient Medications on File Prior to Visit  Medication Sig Dispense Refill   albuterol (ACCUNEB) 1.25 MG/3ML nebulizer solution Take 3 mLs (1.25 mg total) by nebulization every 6 (six) hours as needed for wheezing. 100 mL 1   albuterol (VENTOLIN HFA) 108 (90 Base) MCG/ACT inhaler INHALE 1 PUFF INTO THE LUNGS EVERY 6 HOURS AS NEEDED FOR WHEEZING OR SHORTNESS OF BREATH. 18 each 1   budesonide-formoterol (SYMBICORT) 160-4.5 MCG/ACT inhaler TAKE 2 PUFFS BY MOUTH TWICE A DAY 30.6 each 1   buPROPion (WELLBUTRIN XL) 150 MG 24 hr tablet 150 mg daily.     Cholecalciferol (VITAMIN D) 10 MCG/ML LIQD Take 500 Units by mouth daily at 12 noon.     clonazePAM (KLONOPIN) 1 MG tablet as needed. 1/2 - 1 tab     diphenhydrAMINE HCl (ALLERGY MEDICATION PO) Take by mouth.     fexofenadine (ALLEGRA) 180 MG tablet Take 1 tablet (180 mg total) by mouth daily. 90 tablet 1   FLUoxetine (PROZAC) 10 MG capsule TAKE 1 CAPSULE BY MOUTH EVERY DAY ALONG WITH 20MG  CAPSULE TO =30MG  DAILY. Increase to 2 pills daily the week before menses. Then decrease back to 1 pill after menses. 180 capsule 3   FLUoxetine (PROZAC) 20 MG capsule 1 po daily with the 10 mg=30 mg 180 capsule 3  fluticasone (FLONASE) 50 MCG/ACT nasal spray Place 1 spray into both nostrils daily. 16 g 5   montelukast (SINGULAIR) 10 MG tablet Take 1 tablet (10 mg total) by mouth at bedtime. 90 tablet 1   omeprazole (PRILOSEC) 20 MG capsule Take 1 capsule (20 mg total) by mouth daily. Qod sometimes. 90 capsule 1   rosuvastatin (CRESTOR) 20 MG tablet Take 20 mg by mouth daily. Take 1 tablet every other day     Specialty Vitamins Products (MAGNESIUM, AMINO ACID CHELATE,) 133 MG tablet Take 1 tablet by mouth 2 (two) times daily.     No current facility-administered medications on file prior to visit.     Allergies: Allergies  Allergen Reactions   Dog Epithelium Allergy Skin Test Other  (See Comments)    Asthma?   Ibuprofen     "bronchospams"   Zyrtec [Cetirizine] Photosensitivity    Family History: Migraine or other headaches in the family:  no Aneurysms in a first degree relative:  no Brain tumors in the family:  no Other neurological illness in the family:   mother had a stroke  Past Medical History: Past Medical History:  Diagnosis Date   Asthma    Endometriosis    Frequent headaches    GERD (gastroesophageal reflux disease)    Mixed hyperlipidemia    Panic attacks     Past Surgical History Past Surgical History:  Procedure Laterality Date   LAPAROSCOPY     for endometriosis   WISDOM TOOTH EXTRACTION      Social History: Social History   Tobacco Use   Smoking status: Never   Smokeless tobacco: Never  Substance Use Topics   Alcohol use: Not Currently   Drug use: Never     ROS: Negative for fevers, chills. Positive for headaches. All other systems reviewed and negative unless stated otherwise in HPI.   Physical Exam:   Vital Signs: BP 118/81   Pulse 67   Ht 5' 9.5" (1.765 m)   BMI 27.86 kg/m  GENERAL: well appearing,in no acute distress,alert SKIN:  Color, texture, turgor normal. No rashes or lesions HEAD:  Normocephalic/atraumatic. CV:  RRR RESP: Normal respiratory effort MSK: +tenderness to palpation over bilateral occiput, neck, and shoulders  NEUROLOGICAL: Mental Status: Alert, oriented to person, place and time,Follows commands Cranial Nerves: PERRL, visual fields intact to confrontation, extraocular movements intact, decreased sensation over right V1, no facial droop or ptosis, hearing grossly intact, no dysarthria, palate elevate symmetrically, tongue protrudes midline, shoulder shrug intact and symmetric Motor: muscle strength 5/5 both upper and lower extremities,no drift, normal tone Reflexes: 2+ throughout Sensation: intact to light touch all 4 extremities Coordination: Finger-to- nose-finger intact bilaterally Gait:  normal-based   IMPRESSION: 51 year old female with a history of depression, GAD, asthma who presents for evaluation of worsening headaches over the past 8 months. Will order MRI brain as exam reveals decreased sensation over right V1. Will also order CTA to assess for aneurysm as she reports severe headaches associated with intercourse. Her near daily headaches are most consistent with migraines. Will start Topamax for migraine prevention and Maxalt for rescue.  PLAN: -MRI brain, CTA head -Preventive: Start Topamax. Take 25 mg QHS; increase dose by 25 mg each week as tolerated -- up to 100 mg QHS -Rescue: Start Maxalt 10 mg PRN -next steps: consider neck PT, consider CGRP or Botox (unable to take beta blocker due to asthma/bradycardia, unable to take SNRI or TCA due to interaction with Prozac)   I spent  a total of 33 minutes chart reviewing and counseling the patient. Headache education was done. Discussed treatment options including preventive and acute medications. Discussed medication overuse headache and to limit use of acute treatments to no more than 2 days/week or 10 days/month. Discussed medication side effects, adverse reactions and drug interactions. Written educational materials and patient instructions outlining all of the above were given.  Follow-up: 5 months   Ocie Doyne, MD 08/23/2021   10:06 AM

## 2021-08-22 NOTE — Progress Notes (Signed)
VIALS EXP 08-23-22 

## 2021-08-23 ENCOUNTER — Ambulatory Visit (INDEPENDENT_AMBULATORY_CARE_PROVIDER_SITE_OTHER): Payer: BC Managed Care – PPO | Admitting: Psychiatry

## 2021-08-23 ENCOUNTER — Encounter: Payer: Self-pay | Admitting: Psychiatry

## 2021-08-23 ENCOUNTER — Ambulatory Visit (INDEPENDENT_AMBULATORY_CARE_PROVIDER_SITE_OTHER): Payer: BC Managed Care – PPO | Admitting: Addiction (Substance Use Disorder)

## 2021-08-23 VITALS — BP 118/81 | HR 67 | Ht 69.5 in

## 2021-08-23 DIAGNOSIS — G43109 Migraine with aura, not intractable, without status migrainosus: Secondary | ICD-10-CM

## 2021-08-23 DIAGNOSIS — R519 Headache, unspecified: Secondary | ICD-10-CM

## 2021-08-23 DIAGNOSIS — G4482 Headache associated with sexual activity: Secondary | ICD-10-CM

## 2021-08-23 DIAGNOSIS — F411 Generalized anxiety disorder: Secondary | ICD-10-CM | POA: Diagnosis not present

## 2021-08-23 DIAGNOSIS — J3089 Other allergic rhinitis: Secondary | ICD-10-CM | POA: Diagnosis not present

## 2021-08-23 MED ORDER — TOPIRAMATE 25 MG PO TABS: ORAL_TABLET | ORAL | 6 refills | Status: DC

## 2021-08-23 MED ORDER — RIZATRIPTAN BENZOATE 10 MG PO TABS: 10.0000 mg | ORAL_TABLET | ORAL | 6 refills | Status: DC | PRN

## 2021-08-23 NOTE — Progress Notes (Signed)
      [facility] Counselor/Therapist Progress Note  Patient ID: Sherry Adams, MRN: [id number],    Date: 08/23/2021  Time Spent: 44 mins  Treatment Type: Individual Therapy  Reported Symptoms: concerned, overwhelmed  Mental Status Exam:  Appearance:   Casual     Behavior:  Appropriate  Motor:  Normal  Speech/Language:   Clear and Coherent and Normal Rate  Affect:  Appropriate  Mood:  anxious, irritable, labile, and sad  Thought process:  normal  Thought content:    Obsessions and Rumination  Sensory/Perceptual disturbances:    WNL  Orientation:  x4  Attention:  Good  Concentration:  Fair  Memory:  WNL  Fund of knowledge:   Good  Insight:    Good  Judgment:   Fair  Impulse Control:  Good   Risk Assessment: Danger to Self:  No Self-injurious Behavior: No Danger to Others: No Duty to Warn:no Physical Aggression / Violence:No  Access to Firearms a concern: No  Gang Involvement:No   Subjective: Client processed being overwhelmed by her mom's financial crisis again. Client feeling frustrated and expected by her mom and brother to bail her out again, using her own funds. Client feeling the cost of that for her and her husband, but struggling with the feelings of guilt causing her to continue thinking of spending her hard-earned money to pay for things her mom needs because her mom didn't budget well. Therapist used MI & CBT with client to validate her frustration, while also helping her to process the guilt and challenge those thoughts of engaging in the bail out (to her self-detriment). Therapist also used SFT with client to help her think of ways to engage the family and discuss her continued boundary of not paying for all her mom's needs. Therapist assessed for safety today and client denied SI/HI/AVH.   Interventions: Cognitive Behavioral Therapy, Motivational Interviewing, and Solution-Oriented/Positive Psychology & RPT  Diagnosis:   ICD-10-CM   1. Generalized anxiety  disorder  F41.1      Plan: Mood independent behavior, keeping boundaries without guilt, positive self talk, self-care, assertiveness Client to learn how to process grief and sadness without a physical sensation. Client to create boundaries with family to ensure she is caring for her needs first.  Progress: Client feeling guilty about not paying for everything her mom needs, causing her to continue bailing her mom out, causing financial stressors for herself and her marriage.   [name], LCSW, LCAS, CCTP, CCS, BSP

## 2021-08-23 NOTE — Patient Instructions (Addendum)
Start Topamax for headache prevention. Take 25 mg (1 pill) at bedtime for one week, then increase to 50 mg (2 pills) at bedtime for one week, then take 75 mg (3 pills) at bedtime for one week, then take 100 mg (4 pills) at bedtime  Start rizatriptan as needed for migraines. Take at the onset of migraine. If headache recurs or does not fully resolve, you may take a second dose after 2 hours. Please avoid taking more than 2 days per week  MRI brain, CTA head

## 2021-08-31 ENCOUNTER — Telehealth: Payer: Self-pay | Admitting: Psychiatry

## 2021-08-31 ENCOUNTER — Other Ambulatory Visit: Payer: Self-pay | Admitting: Physician Assistant

## 2021-08-31 NOTE — Telephone Encounter (Signed)
[employer] Sherry Adams: [id number] exp. 08/31/21-09/29/21 sent to GI they will call the patient to schedule

## 2021-09-04 ENCOUNTER — Ambulatory Visit (INDEPENDENT_AMBULATORY_CARE_PROVIDER_SITE_OTHER): Payer: BC Managed Care – PPO

## 2021-09-04 DIAGNOSIS — J309 Allergic rhinitis, unspecified: Secondary | ICD-10-CM | POA: Diagnosis not present

## 2021-09-04 NOTE — Telephone Encounter (Signed)
[employer] Auth: [id number] exp. 09/04/21-10/03/21 MRI

## 2021-09-06 ENCOUNTER — Ambulatory Visit: Payer: BC Managed Care – PPO | Admitting: Addiction (Substance Use Disorder)

## 2021-09-10 DIAGNOSIS — F411 Generalized anxiety disorder: Secondary | ICD-10-CM | POA: Diagnosis not present

## 2021-09-13 ENCOUNTER — Ambulatory Visit (INDEPENDENT_AMBULATORY_CARE_PROVIDER_SITE_OTHER): Payer: BC Managed Care – PPO

## 2021-09-13 DIAGNOSIS — J309 Allergic rhinitis, unspecified: Secondary | ICD-10-CM

## 2021-09-17 DIAGNOSIS — F411 Generalized anxiety disorder: Secondary | ICD-10-CM | POA: Diagnosis not present

## 2021-09-18 ENCOUNTER — Ambulatory Visit (INDEPENDENT_AMBULATORY_CARE_PROVIDER_SITE_OTHER): Payer: BC Managed Care – PPO | Admitting: Addiction (Substance Use Disorder)

## 2021-09-18 ENCOUNTER — Ambulatory Visit
Admission: RE | Admit: 2021-09-18 | Discharge: 2021-09-18 | Disposition: A | Discharge status: Home / Self Care | Payer: BC Managed Care – PPO | Source: Ambulatory Visit | Attending: Psychiatry | Admitting: Psychiatry

## 2021-09-18 DIAGNOSIS — F411 Generalized anxiety disorder: Secondary | ICD-10-CM

## 2021-09-18 DIAGNOSIS — R519 Headache, unspecified: Secondary | ICD-10-CM

## 2021-09-18 MED ORDER — GADOBENATE DIMEGLUMINE 529 MG/ML IV SOLN
20.0000 mL | Freq: Once | INTRAVENOUS | Status: AC | PRN
  Administered 2021-09-18: 20 mL via INTRAVENOUS

## 2021-09-18 NOTE — Progress Notes (Signed)
      [facility] Counselor/Therapist Progress Note  Patient ID: Sherry Adams, MRN: [id number],    Date: 09/18/2021  Time Spent:  Treatment Type: Individual Therapy  Reported Symptoms: feeling let down, used  Mental Status Exam:  Appearance:   Casual     Behavior:  Appropriate  Motor:  Normal  Speech/Language:   Clear and Coherent and Normal Rate  Affect:  Appropriate  Mood:  sad and upset, frustrated  Thought process:  normal  Thought content:    Rumination  Sensory/Perceptual disturbances:    WNL  Orientation:  x4  Attention:  Good  Concentration:  Fair  Memory:  WNL  Fund of knowledge:   Good  Insight:    Good  Judgment:   Fair  Impulse Control:  Good   Risk Assessment: Danger to Self:  No Self-injurious Behavior: No Danger to Others: No Duty to Warn:no Physical Aggression / Violence:No  Access to Firearms a concern: No  Gang Involvement:No   Subjective: Client processed feeling let down and used by one of her friends who came here to "see the client" but then outstayed her welcome and played the "pity game" to try to stay longer. Client processed how this made her feel and her questions about what brought this on. Therapist used MI & CBT with client to validate her feelings, explore with her her questions, and process more of her emotions about this and how it affected her long term. Client also shared about this pattern of finding friends who she feels "use her" at times and attracting those folks who dont honor her boundaries and therapist and client worked throughout session to discuss her triggers and ties to childhood neglect/trauma and also to process a goal (SFT) of protecting herself from these vulnerabilities from "these types of friends". Therapist assessed for safety today and client denied SI/HI/AVH.   Interventions: Cognitive Behavioral Therapy, Motivational Interviewing, and Solution-Oriented/Positive Psychology & RPT  Diagnosis:   ICD-10-CM    1. Generalized anxiety disorder  F41.1       Plan: Mood independent behavior, keeping boundaries without guilt, positive self talk, self-care, assertiveness Client to learn how to process grief and sadness without a physical sensation. Client to create boundaries with family to ensure she is caring for her needs first Progress: Client making progress setting expectations with those shes in relationship with to keep her boundaries or assess if she needs to continue the relationship/friendship. Client reported also less guilt about not paying for all her mom's needs/wants and therefore not bailing her out, putting herself in a bad situation.   [name], LCSW, LCAS, CCTP, CCS, BSP

## 2021-09-24 ENCOUNTER — Ambulatory Visit
Admission: RE | Admit: 2021-09-24 | Discharge: 2021-09-24 | Disposition: A | Discharge status: Home / Self Care | Payer: BC Managed Care – PPO | Source: Ambulatory Visit | Attending: Psychiatry | Admitting: Psychiatry

## 2021-09-24 DIAGNOSIS — R519 Headache, unspecified: Secondary | ICD-10-CM | POA: Diagnosis not present

## 2021-09-24 DIAGNOSIS — G4482 Headache associated with sexual activity: Secondary | ICD-10-CM

## 2021-09-24 MED ORDER — IOPAMIDOL (ISOVUE-370) INJECTION 76%
75.0000 mL | Freq: Once | INTRAVENOUS | Status: AC | PRN
Start: 2021-09-24 — End: 2021-09-24
  Administered 2021-09-24: 75 mL via INTRAVENOUS

## 2021-09-27 ENCOUNTER — Ambulatory Visit (INDEPENDENT_AMBULATORY_CARE_PROVIDER_SITE_OTHER): Payer: BC Managed Care – PPO

## 2021-09-27 DIAGNOSIS — J309 Allergic rhinitis, unspecified: Secondary | ICD-10-CM

## 2021-10-01 DIAGNOSIS — F411 Generalized anxiety disorder: Secondary | ICD-10-CM | POA: Diagnosis not present

## 2021-10-04 ENCOUNTER — Ambulatory Visit (INDEPENDENT_AMBULATORY_CARE_PROVIDER_SITE_OTHER): Payer: BC Managed Care – PPO | Admitting: Addiction (Substance Use Disorder)

## 2021-10-04 DIAGNOSIS — F411 Generalized anxiety disorder: Secondary | ICD-10-CM | POA: Diagnosis not present

## 2021-10-04 NOTE — Progress Notes (Signed)
      [facility] Counselor/Therapist Progress Note  Patient ID: Sherry Adams, MRN: [id number],    Date: 10/04/2021  Time Spent:  Treatment Type: Individual Therapy  Reported Symptoms: stressed/ annoyed by her family drama  Mental Status Exam:  Appearance:   Casual and Well Groomed     Behavior:  Appropriate  Motor:  Normal  Speech/Language:   Clear and Coherent and Normal Rate  Affect:  Appropriate  Mood:  upset, frustrated  Thought process:  normal  Thought content:    Rumination  Sensory/Perceptual disturbances:    WNL  Orientation:  x4  Attention:  Good  Concentration:  Fair  Memory:  WNL  Fund of knowledge:   Good  Insight:    Good  Judgment:   Fair  Impulse Control:  Good   Risk Assessment: Danger to Self:  No Self-injurious Behavior: No Danger to Others: No Duty to Warn:no Physical Aggression / Violence:No  Access to Firearms a concern: No  Gang Involvement:No   Subjective: Client processed stressed/ annoyed by her family drama. Client processed about how her brother dumps on her and doesn't ask her about herself and it hurts her feelings and takes her energy. Therapist used Mi & CBT with client to validate her feelings and support her as she processed her feelings and thoughts about their relationship. Therpist also used roleplaying with client to help her think of ways she could wrap their 2 hour talk up earlier if she wanted to and validate his feelings, while also not ignoring her own and just listen to him for 2 hours every week. Client made progress honoring herself and her time, and seeing a need to change something and practicing how she may say that to him to help herself. Therapist assessed for safety today and client denied SI/HI/AVH.    Interventions: Cognitive Behavioral Therapy, Roleplay, and Motivational Interviewing & RPT  Diagnosis:   ICD-10-CM   1. Generalized anxiety disorder  F41.1       Plan: Mood independent behavior, keeping  boundaries without guilt, positive self talk, self-care, assertiveness.  Client to learn how to process grief and sadness without a physical sensation. Client to create boundaries with family to ensure she is caring for her needs first. Progress: Client made progress honoring herself and her time, and seeing a need to change something by getting off the phone call with her family when they only dump on her and by learning how she may draw that boundary with them and honor her own time. Client making progress setting expectations with those shes in relationship with to keep her boundaries or assess if she needs to continue the relationship/friendship. Client reported also less guilt about not paying for all her mom's needs/wants and therefore not bailing her out, putting herself in a bad situation.   [name], LCSW, LCAS, CCTP, CCS, BSP

## 2021-10-08 DIAGNOSIS — F411 Generalized anxiety disorder: Secondary | ICD-10-CM | POA: Diagnosis not present

## 2021-10-09 ENCOUNTER — Ambulatory Visit (INDEPENDENT_AMBULATORY_CARE_PROVIDER_SITE_OTHER): Payer: BC Managed Care – PPO

## 2021-10-09 DIAGNOSIS — J309 Allergic rhinitis, unspecified: Secondary | ICD-10-CM

## 2021-10-15 DIAGNOSIS — F411 Generalized anxiety disorder: Secondary | ICD-10-CM | POA: Diagnosis not present

## 2021-10-18 ENCOUNTER — Ambulatory Visit: Payer: BC Managed Care – PPO | Admitting: Addiction (Substance Use Disorder)

## 2021-10-18 ENCOUNTER — Ambulatory Visit (INDEPENDENT_AMBULATORY_CARE_PROVIDER_SITE_OTHER): Payer: BC Managed Care – PPO

## 2021-10-18 DIAGNOSIS — J309 Allergic rhinitis, unspecified: Secondary | ICD-10-CM

## 2021-11-01 ENCOUNTER — Ambulatory Visit (INDEPENDENT_AMBULATORY_CARE_PROVIDER_SITE_OTHER): Payer: BC Managed Care – PPO

## 2021-11-01 DIAGNOSIS — J309 Allergic rhinitis, unspecified: Secondary | ICD-10-CM | POA: Diagnosis not present

## 2021-11-05 DIAGNOSIS — F411 Generalized anxiety disorder: Secondary | ICD-10-CM | POA: Diagnosis not present

## 2021-11-06 ENCOUNTER — Encounter: Payer: Self-pay | Admitting: Allergy and Immunology

## 2021-11-06 ENCOUNTER — Ambulatory Visit (INDEPENDENT_AMBULATORY_CARE_PROVIDER_SITE_OTHER): Payer: BC Managed Care – PPO | Admitting: Allergy and Immunology

## 2021-11-06 VITALS — BP 118/70 | HR 72 | Temp 97.6°F | Resp 16 | Ht 69.0 in

## 2021-11-06 DIAGNOSIS — J301 Allergic rhinitis due to pollen: Secondary | ICD-10-CM

## 2021-11-06 DIAGNOSIS — J4540 Moderate persistent asthma, uncomplicated: Secondary | ICD-10-CM

## 2021-11-06 DIAGNOSIS — T781XXD Other adverse food reactions, not elsewhere classified, subsequent encounter: Secondary | ICD-10-CM

## 2021-11-06 DIAGNOSIS — J3089 Other allergic rhinitis: Secondary | ICD-10-CM

## 2021-11-06 DIAGNOSIS — M2550 Pain in unspecified joint: Secondary | ICD-10-CM | POA: Diagnosis not present

## 2021-11-06 NOTE — Progress Notes (Signed)
Sherry [facility] - [place] [facility] [place] [facility] [place] - [facility]   Follow-up Note  Referring Provider: Ileana Ladd, MD Primary Provider: Ileana Ladd, MD (Inactive) Date of Office Visit: 11/06/2021  Subjective:   Sherry Adams (DOB: [birth year]) is a 51 y.o. female who [facility] in re-evaluation of the following:  HPI: Sherry Adams returns to this clinic in evaluation of asthma, allergic rhinoconjunctivitis, and oral allergy syndrome.  Her last visit to this clinic was 01 May 2021.  She has really done very well with her asthma and has not required a systemic steroid to treat an exacerbation and rarely uses a short acting bronchodilator while she continues on Symbicort mostly 1 time per day.  Her nose has really been doing very well while using Flonase 1 spray each nostril 1 time per day and montelukast on a consistent basis.  She has not required an antibiotic to treat an episode of sinusitis.  She continues on immunotherapy currently at every week.  She has found that her oral allergy syndrome has basically resolved while using immunotherapy and she can eat all foods without any problem.  She has not tried asparagus yet.  Allergies as of 11/06/2021       Reactions   Dog Epithelium Allergy Skin Test Other (See Comments)   Asthma?   Ibuprofen    "bronchospams"   Zyrtec [cetirizine] Photosensitivity        Medication List    albuterol 1.25 MG/3ML nebulizer solution Commonly known as: ACCUNEB Take 3 mLs (1.25 mg total) by nebulization every 6 (six) hours as needed for wheezing.   albuterol 108 (90 Base) MCG/ACT inhaler Commonly known as: VENTOLIN HFA INHALE 1 PUFF INTO THE LUNGS EVERY 6 HOURS AS NEEDED FOR WHEEZING OR SHORTNESS OF BREATH.   ALLERGY MEDICATION PO Take by mouth.   budesonide-formoterol 160-4.5 MCG/ACT inhaler Commonly known as: SYMBICORT TAKE 2 PUFFS BY MOUTH TWICE A DAY   buPROPion 150 MG 24 hr  tablet Commonly known as: WELLBUTRIN XL 150 mg daily.   clonazePAM 1 MG tablet Commonly known as: KLONOPIN as needed. 1/2 - 1 tab   fexofenadine 180 MG tablet Commonly known as: ALLEGRA Take 1 tablet (180 mg total) by mouth daily.   FLUoxetine 20 MG capsule Commonly known as: PROZAC 1 po daily with the 10 mg=30 mg   FLUoxetine 10 MG capsule Commonly known as: PROZAC TAKE 1 CAPSULE BY MOUTH EVERY DAY ALONG WITH 20MG  CAPSULE TO =30MG  DAILY   fluticasone 50 MCG/ACT nasal spray Commonly known as: FLONASE Place 1 spray into both nostrils daily.   magnesium (amino acid chelate) 133 MG tablet Take 1 tablet by mouth 2 (two) times daily.   montelukast 10 MG tablet Commonly known as: SINGULAIR Take 1 tablet (10 mg total) by mouth at bedtime.   omeprazole 20 MG capsule Commonly known as: PRILOSEC Take 1 capsule (20 mg total) by mouth daily. Qod sometimes.   rizatriptan 10 MG tablet Commonly known as: Maxalt Take 1 tablet (10 mg total) by mouth as needed for migraine. May repeat in 2 hours if needed   rosuvastatin 20 MG tablet Commonly known as: CRESTOR Take 20 mg by mouth daily. Take 1 tablet every other day   topiramate 25 MG tablet Commonly known as: TOPAMAX Take 25 mg (1 pill) at bedtime for one week, then increase to 50 mg (2 pills) at bedtime for one week, then take 75 mg (3 pills) at bedtime for  one week, then take 100 mg (4 pills) at bedtime   Vitamin D 10 MCG/ML Liqd Take 500 Units by mouth daily at 12 noon.    Past Medical History:  Diagnosis Date   Asthma    Endometriosis    Frequent headaches    GERD (gastroesophageal reflux disease)    Mixed hyperlipidemia    Panic attacks     Past Surgical History:  Procedure Laterality Date   LAPAROSCOPY     for endometriosis   WISDOM TOOTH EXTRACTION      Review of systems negative except as noted in HPI / PMHx or noted below:  Review of Systems  Constitutional: Negative.   HENT: Negative.    Eyes:  Negative.   Respiratory: Negative.    Cardiovascular: Negative.   Gastrointestinal: Negative.   Genitourinary: Negative.   Musculoskeletal: Negative.   Skin: Negative.   Neurological: Negative.   Endo/Heme/Allergies: Negative.   Psychiatric/Behavioral: Negative.       Objective:   Vitals:   11/06/21 1129  BP: 118/70  Pulse: 72  Resp: 16  Temp: 97.6 F (36.4 C)  SpO2: 97%   Height: 5\' 9"  (175.3 cm)      Physical Exam Constitutional:      Appearance: She is not diaphoretic.  HENT:     Head: Normocephalic.     Right Ear: Tympanic membrane, ear canal and external ear normal.     Left Ear: Tympanic membrane, ear canal and external ear normal.     Nose: Nose normal. No mucosal edema or rhinorrhea.     Mouth/Throat:     Pharynx: Uvula midline. No oropharyngeal exudate.  Eyes:     Conjunctiva/sclera: Conjunctivae normal.  Neck:     Thyroid: No thyromegaly.     Trachea: Trachea normal. No tracheal tenderness or tracheal deviation.  Cardiovascular:     Rate and Rhythm: Normal rate and regular rhythm.     Heart sounds: Normal heart sounds, S1 normal and S2 normal. No murmur heard. Pulmonary:     Effort: No respiratory distress.     Breath sounds: Normal breath sounds. No stridor. No wheezing or rales.  Lymphadenopathy:     Head:     Right side of head: No tonsillar adenopathy.     Left side of head: No tonsillar adenopathy.     Cervical: No cervical adenopathy.  Skin:    Findings: No erythema or rash.     Nails: There is no clubbing.  Neurological:     Mental Status: She is alert.     Diagnostics:    Spirometry was performed and demonstrated an FEV1 of 2.97 at 93 % of predicted.  Assessment and Plan:   1. Asthma, moderate persistent, well-controlled   2. Perennial allergic rhinitis   3. Seasonal allergic rhinitis due to pollen   4. Pollen-food allergy, subsequent encounter    1.  Allergen avoidance measures -dust mite, cat, dog, pollens, cockroach    2.   Treat and prevent inflammation:  A. Symbicort 160 - 2 inhalations 1-2 times per day   B. Flonase - 1-2 sprays each nostril 1 time per day C. Montelukast 10 mg - 1 tablet 1 time per day D. Immunotherapy   3. If needed:  A. Fexofenadine 180 - 1 tablet 1 time per day B. Albuterol HFA - 2 inhalations 1 time per day C. Auvi-Q 0.3, benadryl, MD/ER evaluation for allergic reaction  4. Return to clinic in 6 months or earlier if problem  5.  Obtain fall flu vaccine  Zayra is doing quite well with her immunotherapy as this has resulted in dramatic improvement regarding her respiratory tract issue and has helped her oral allergy syndrome significantly.  She will continue to use this form of therapy and she has a selection of anti-inflammatory agents to use for her airway as well.  I will see her back in this clinic in 6 months or earlier if there is a problem.  Laurette Schimke, MD Allergy / Immunology [facility]

## 2021-11-06 NOTE — Patient Instructions (Addendum)
  1.  Allergen avoidance measures -dust mite, cat, dog, pollens, cockroach    2.  Treat and prevent inflammation:  A. Symbicort 160 - 2 inhalations 1-2 times per day   B. Flonase - 1-2 sprays each nostril 1 time per day C. Montelukast 10 mg - 1 tablet 1 time per day D. Immunotherapy   3. If needed:  A. Fexofenadine 180 - 1 tablet 1 time per day B. Albuterol HFA - 2 inhalations 1 time per day C. Auvi-Q 0.3, benadryl, MD/ER evaluation for allergic reaction  4. Return to clinic in 6 months or earlier if problem  5. Obtain fall flu vaccine

## 2021-11-07 ENCOUNTER — Encounter: Payer: Self-pay | Admitting: Allergy and Immunology

## 2021-11-07 ENCOUNTER — Ambulatory Visit: Payer: BC Managed Care – PPO | Admitting: Addiction (Substance Use Disorder)

## 2021-11-07 MED ORDER — EPINEPHRINE 0.3 MG/0.3ML IJ SOAJ: 0.3000 mg | INTRAMUSCULAR | 1 refills | Status: DC | PRN

## 2021-11-07 MED ORDER — ALBUTEROL SULFATE HFA 108 (90 BASE) MCG/ACT IN AERS
INHALATION_SPRAY | RESPIRATORY_TRACT | 1 refills | Status: DC
Start: 2021-11-07 — End: 2022-05-14

## 2021-11-07 MED ORDER — MONTELUKAST SODIUM 10 MG PO TABS: 10.0000 mg | ORAL_TABLET | Freq: Every day | ORAL | 1 refills | Status: DC

## 2021-11-07 MED ORDER — FEXOFENADINE HCL 180 MG PO TABS: 180.0000 mg | ORAL_TABLET | Freq: Every day | ORAL | 1 refills | Status: DC

## 2021-11-07 MED ORDER — BUDESONIDE-FORMOTEROL FUMARATE 160-4.5 MCG/ACT IN AERO: INHALATION_SPRAY | RESPIRATORY_TRACT | 1 refills | Status: DC

## 2021-11-07 MED ORDER — FLUTICASONE PROPIONATE 50 MCG/ACT NA SUSP: 1.0000 | Freq: Every day | NASAL | 5 refills | Status: DC

## 2021-11-12 ENCOUNTER — Ambulatory Visit: Payer: BC Managed Care – PPO | Admitting: Physician Assistant

## 2021-11-12 DIAGNOSIS — F411 Generalized anxiety disorder: Secondary | ICD-10-CM | POA: Diagnosis not present

## 2021-11-13 ENCOUNTER — Ambulatory Visit: Payer: BC Managed Care – PPO | Admitting: Allergy and Immunology

## 2021-11-14 ENCOUNTER — Ambulatory Visit (INDEPENDENT_AMBULATORY_CARE_PROVIDER_SITE_OTHER): Payer: BC Managed Care – PPO | Admitting: Physician Assistant

## 2021-11-14 ENCOUNTER — Encounter: Payer: Self-pay | Admitting: Physician Assistant

## 2021-11-14 DIAGNOSIS — F3281 Premenstrual dysphoric disorder: Secondary | ICD-10-CM

## 2021-11-14 DIAGNOSIS — F411 Generalized anxiety disorder: Secondary | ICD-10-CM

## 2021-11-14 DIAGNOSIS — F3341 Major depressive disorder, recurrent, in partial remission: Secondary | ICD-10-CM

## 2021-11-14 DIAGNOSIS — F4310 Post-traumatic stress disorder, unspecified: Secondary | ICD-10-CM | POA: Diagnosis not present

## 2021-11-14 MED ORDER — CLONAZEPAM 1 MG PO TABS: 0.5000 mg | ORAL_TABLET | Freq: Two times a day (BID) | ORAL | 1 refills | Status: DC | PRN

## 2021-11-14 NOTE — Progress Notes (Signed)
[facility] Med Check  Patient ID: Sherry Adams,  MRN: [id number]  PCP: Ileana Ladd, MD (Inactive)  Date of Evaluation: 11/14/2021 Time spent:20 minutes  Chief Complaint:  Chief Complaint   Anxiety; Depression; Follow-up    HISTORY/CURRENT STATUS: HPI For routine med check.  Sherry Adams feels like she is doing okay as far as medications go.  However she has been more "down" recently, and is still working through trauma with her therapist.  Is concerned she may need more support, "help processing the trauma" when Sherry Adams is on maternity leave toward the end of the year and not sure how to handle that.  We will discuss with Sherry Adams at their next session.  Patient is able to enjoy things.  Not as energetic or motivated as she feels she should be.  Work is going well.   No extreme sadness, tearfulness, or feelings of hopelessness.  Sleeps well most of the time. ADLs and personal hygiene are normal.   Denies any changes in concentration, making decisions, or remembering things.  Appetite has not changed.  Weight is stable.  Anxiety still can be a problem at times but not often.  Not having panic attacks but does get overwhelmed and that is when she needs the Klonopin.  It is effective.  PMDD symptoms are well controlled with the increased Prozac the week prior to menses.  Menses are irregular so it is hard to know when to take the medication.  Denies suicidal or homicidal thoughts.  Patient denies increased energy with decreased need for sleep, increased talkativeness, racing thoughts, impulsivity or risky behaviors, increased spending, increased libido, grandiosity, increased irritability or anger, paranoia, and no hallucinations.  Denies dizziness, syncope, seizures, numbness, tingling, tremor, tics, unsteady gait, slurred speech, confusion. Denies muscle or joint pain, stiffness, or dystonia.  Individual Medical History/ Review of Systems: Changes? :Yes   will be seeing a rheumatologist soon.   May have rheumatoid arthritis  Past medications for mental health diagnoses include: Zoloft, Effexor, Prozac, Klonopin, Wellbutrin  Allergies: Dog epithelium allergy skin test, Ibuprofen, and Zyrtec [cetirizine]  Current Medications:  Current Outpatient Medications:    albuterol (ACCUNEB) 1.25 MG/3ML nebulizer solution, Take 3 mLs (1.25 mg total) by nebulization every 6 (six) hours as needed for wheezing., Disp: 100 mL, Rfl: 1   albuterol (VENTOLIN HFA) 108 (90 Base) MCG/ACT inhaler, INHALE 1 PUFF INTO THE LUNGS EVERY 6 HOURS AS NEEDED FOR WHEEZING OR SHORTNESS OF BREATH., Disp: 18 each, Rfl: 1   budesonide-formoterol (SYMBICORT) 160-4.5 MCG/ACT inhaler, TAKE 2 PUFFS BY MOUTH TWICE A DAY, Disp: 30.6 each, Rfl: 1   buPROPion (WELLBUTRIN XL) 150 MG 24 hr tablet, 150 mg daily., Disp: , Rfl:    diphenhydrAMINE HCl (ALLERGY MEDICATION PO), Take by mouth., Disp: , Rfl:    fexofenadine (ALLEGRA) 180 MG tablet, Take 1 tablet (180 mg total) by mouth daily., Disp: 90 tablet, Rfl: 1   FLUoxetine (PROZAC) 10 MG capsule, TAKE 1 CAPSULE BY MOUTH EVERY DAY ALONG WITH 20MG  CAPSULE TO =30MG  DAILY, Disp: 90 capsule, Rfl: 0   FLUoxetine (PROZAC) 20 MG capsule, 1 po daily with the 10 mg=30 mg, Disp: 180 capsule, Rfl: 3   fluticasone (FLONASE) 50 MCG/ACT nasal spray, Place 1 spray into both nostrils daily., Disp: 16 g, Rfl: 5   montelukast (SINGULAIR) 10 MG tablet, Take 1 tablet (10 mg total) by mouth at bedtime., Disp: 90 tablet, Rfl: 1   omeprazole (PRILOSEC) 20 MG capsule, Take 1 capsule (20 mg total) by mouth daily.  Qod sometimes., Disp: 90 capsule, Rfl: 1   rizatriptan (MAXALT) 10 MG tablet, Take 1 tablet (10 mg total) by mouth as needed for migraine. May repeat in 2 hours if needed, Disp: 10 tablet, Rfl: 6   rosuvastatin (CRESTOR) 20 MG tablet, Take 20 mg by mouth daily. Take 1 tablet every other day, Disp: , Rfl:    Specialty Vitamins Products (MAGNESIUM, AMINO ACID CHELATE,) 133 MG tablet, Take 1 tablet by  mouth 2 (two) times daily., Disp: , Rfl:    topiramate (TOPAMAX) 25 MG tablet, Take 25 mg (1 pill) at bedtime for one week, then increase to 50 mg (2 pills) at bedtime for one week, then take 75 mg (3 pills) at bedtime for one week, then take 100 mg (4 pills) at bedtime, Disp: 120 tablet, Rfl: 6   Cholecalciferol (VITAMIN D) 10 MCG/ML LIQD, Take 500 Units by mouth daily at 12 noon. (Patient not taking: Reported on 11/14/2021), Disp: , Rfl:    clonazePAM (KLONOPIN) 1 MG tablet, Take 0.5-1 tablets (0.5-1 mg total) by mouth 2 (two) times daily as needed. 1/2 - 1 tab, Disp: 30 tablet, Rfl: 1   EPINEPHrine 0.3 mg/0.3 mL IJ SOAJ injection, Inject 0.3 mg into the muscle as needed for anaphylaxis. (Patient not taking: Reported on 11/14/2021), Disp: 2 each, Rfl: 1 Medication Side Effects: none  Family Medical/ Social History: Changes?  No  MENTAL HEALTH EXAM:  There were no vitals taken for this visit.There is no height or weight on file to calculate BMI.  General Appearance: Casual, Neat and Well Groomed  Eye Contact:  Good  Speech:  Clear and Coherent and Normal Rate  Volume:  Normal  Mood:  Euthymic  Affect:  Congruent  Thought Process:  Goal Directed and Descriptions of Associations: Circumstantial  Orientation:  Full (Time, Place, and Person)  Thought Content: Logical   Suicidal Thoughts:  No  Homicidal Thoughts:  No  Memory:  WNL  Judgement:  Good  Insight:  Good  Psychomotor Activity:  Normal  Concentration:  Concentration: Fair and Attention Span: Good  Recall:  Good  Fund of Knowledge: Good  Language: Good  Assets:  Desire for Improvement Financial Resources/Insurance Housing Transportation Vocational/Educational  ADL's:  Intact  Cognition: WNL  Prognosis:  Good   DIAGNOSES:    ICD-10-CM   1. Recurrent major depressive disorder, in partial remission (HCC)  F33.41     2. PTSD (post-traumatic stress disorder)  F43.10     3. PMDD (premenstrual dysphoric disorder)  F32.81      4. Generalized anxiety disorder  F41.1      Receiving Psychotherapy: Yes   [name] Shutter, LCSW  RECOMMENDATIONS:  PDMP was unable to be reviewed. I provided 20 minutes of face to face time during this encounter, including time spent before and after the visit in records review, medical decision making, counseling pertinent to today's visit, and charting.   She is doing well as far as her medications go so no changes will be made.  Since she is having a bit of a hard time right now I do not want to go 6 months in between visits though.  Of course she can call at any time if needed before our next routine med check.  Continue Wellbutrin XL 150 mg, 1 p.o. every morning.   Continue Klonopin 0.5 mg 1/2-1 twice daily as needed. Continue Prozac 30 mg every morning with then increased to 40 mg daily 1 week prior to menses and then go  back to 30 mg after her menses begins. Continue therapy with [name] Shutter, LCSW.  She will talk with Sherry Adams at her next visit about a plan while she is out on maternity leave, I will also discuss with Sherry Adams. Return in 3 months  Melony Overly, [place]

## 2021-11-16 ENCOUNTER — Ambulatory Visit (INDEPENDENT_AMBULATORY_CARE_PROVIDER_SITE_OTHER): Payer: BC Managed Care – PPO

## 2021-11-16 DIAGNOSIS — J309 Allergic rhinitis, unspecified: Secondary | ICD-10-CM | POA: Diagnosis not present

## 2021-11-19 DIAGNOSIS — F411 Generalized anxiety disorder: Secondary | ICD-10-CM | POA: Diagnosis not present

## 2021-11-20 ENCOUNTER — Encounter: Payer: Self-pay | Admitting: Allergy and Immunology

## 2021-11-20 ENCOUNTER — Ambulatory Visit (INDEPENDENT_AMBULATORY_CARE_PROVIDER_SITE_OTHER): Payer: BC Managed Care – PPO | Admitting: Allergy and Immunology

## 2021-11-20 VITALS — BP 118/80 | HR 70 | Temp 98.7°F | Resp 18 | Ht 69.0 in

## 2021-11-20 DIAGNOSIS — M2550 Pain in unspecified joint: Secondary | ICD-10-CM

## 2021-11-20 DIAGNOSIS — J309 Allergic rhinitis, unspecified: Secondary | ICD-10-CM | POA: Diagnosis not present

## 2021-11-20 NOTE — Progress Notes (Unsigned)
Sherry Adams presents to this clinic with several questions regarding her atopic disease and rheumatoid arthritis.  Apparently she has been having problems with fatigue and recurrent arthralgia over the course of the past year and someone has checked her blood and apparently she has a positive RF factor and she is being referred to rheumatology.  She wonders if the immunotherapy is contributing to this issue.  She has never seen a temporal relationship between the use of her immunotherapy and the development of this issue.  I did inform her that it is very unusual for immunotherapy to cause this type of immunological response directed against cartilage.  And she does need to follow-up with rheumatology regarding further evaluation of this issue.

## 2021-11-21 ENCOUNTER — Ambulatory Visit (INDEPENDENT_AMBULATORY_CARE_PROVIDER_SITE_OTHER): Payer: BC Managed Care – PPO | Admitting: Addiction (Substance Use Disorder)

## 2021-11-21 ENCOUNTER — Encounter: Payer: Self-pay | Admitting: Allergy and Immunology

## 2021-11-21 DIAGNOSIS — F3341 Major depressive disorder, recurrent, in partial remission: Secondary | ICD-10-CM

## 2021-11-21 DIAGNOSIS — F4310 Post-traumatic stress disorder, unspecified: Secondary | ICD-10-CM
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
## 2021-11-29 ENCOUNTER — Ambulatory Visit (INDEPENDENT_AMBULATORY_CARE_PROVIDER_SITE_OTHER): Payer: BC Managed Care – PPO | Admitting: *Deleted

## 2021-11-29 DIAGNOSIS — J309 Allergic rhinitis, unspecified: Secondary | ICD-10-CM | POA: Diagnosis not present

## 2021-12-03 DIAGNOSIS — F411 Generalized anxiety disorder: Secondary | ICD-10-CM | POA: Diagnosis not present

## 2021-12-05 ENCOUNTER — Ambulatory Visit (INDEPENDENT_AMBULATORY_CARE_PROVIDER_SITE_OTHER): Payer: BC Managed Care – PPO | Admitting: Addiction (Substance Use Disorder)

## 2021-12-05 DIAGNOSIS — F4310 Post-traumatic stress disorder, unspecified: Secondary | ICD-10-CM
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
## 2021-12-06 ENCOUNTER — Ambulatory Visit (INDEPENDENT_AMBULATORY_CARE_PROVIDER_SITE_OTHER): Payer: BC Managed Care – PPO | Admitting: *Deleted

## 2021-12-06 DIAGNOSIS — J309 Allergic rhinitis, unspecified: Secondary | ICD-10-CM | POA: Diagnosis not present

## 2021-12-10 DIAGNOSIS — F411 Generalized anxiety disorder: Secondary | ICD-10-CM | POA: Diagnosis not present

## 2021-12-13 ENCOUNTER — Ambulatory Visit (INDEPENDENT_AMBULATORY_CARE_PROVIDER_SITE_OTHER): Payer: BC Managed Care – PPO

## 2021-12-13 DIAGNOSIS — J309 Allergic rhinitis, unspecified: Secondary | ICD-10-CM | POA: Diagnosis not present

## 2021-12-16 ENCOUNTER — Other Ambulatory Visit: Payer: Self-pay | Admitting: Physician Assistant

## 2021-12-19 ENCOUNTER — Ambulatory Visit (INDEPENDENT_AMBULATORY_CARE_PROVIDER_SITE_OTHER): Payer: BC Managed Care – PPO | Admitting: *Deleted

## 2021-12-19 DIAGNOSIS — J309 Allergic rhinitis, unspecified: Secondary | ICD-10-CM

## 2021-12-20 ENCOUNTER — Ambulatory Visit (INDEPENDENT_AMBULATORY_CARE_PROVIDER_SITE_OTHER): Payer: BC Managed Care – PPO | Admitting: Addiction (Substance Use Disorder)

## 2021-12-26 DIAGNOSIS — F4320 Adjustment disorder, unspecified: Secondary | ICD-10-CM | POA: Diagnosis not present

## 2022-01-01 DIAGNOSIS — M79642 Pain in left hand: Secondary | ICD-10-CM | POA: Diagnosis not present

## 2022-01-01 DIAGNOSIS — R768 Other specified abnormal immunological findings in serum: Secondary | ICD-10-CM | POA: Diagnosis not present

## 2022-01-01 DIAGNOSIS — R5382 Chronic fatigue, unspecified: Secondary | ICD-10-CM | POA: Diagnosis not present

## 2022-01-01 DIAGNOSIS — M064 Inflammatory polyarthropathy: Secondary | ICD-10-CM | POA: Diagnosis not present

## 2022-01-01 DIAGNOSIS — M79641 Pain in right hand: Secondary | ICD-10-CM | POA: Diagnosis not present

## 2022-01-01 DIAGNOSIS — J45909 Unspecified asthma, uncomplicated: Secondary | ICD-10-CM | POA: Diagnosis not present

## 2022-01-02 DIAGNOSIS — F4320 Adjustment disorder, unspecified: Secondary | ICD-10-CM | POA: Diagnosis not present

## 2022-01-03 ENCOUNTER — Ambulatory Visit (INDEPENDENT_AMBULATORY_CARE_PROVIDER_SITE_OTHER): Payer: BC Managed Care – PPO | Admitting: Addiction (Substance Use Disorder)

## 2022-01-03 ENCOUNTER — Ambulatory Visit (INDEPENDENT_AMBULATORY_CARE_PROVIDER_SITE_OTHER): Payer: BC Managed Care – PPO

## 2022-01-03 DIAGNOSIS — J309 Allergic rhinitis, unspecified: Secondary | ICD-10-CM | POA: Diagnosis not present

## 2022-01-03 DIAGNOSIS — F411 Generalized anxiety disorder: Secondary | ICD-10-CM | POA: Diagnosis not present

## 2022-01-03 NOTE — Progress Notes (Signed)
[facility] Counselor/Therapist Progress Note  Patient ID: Nakeia Calvi, MRN: [id number],    Date: 01/03/2022  Time Spent:  Treatment Type: Individual Therapy  Reported Symptoms: stressed, sad  Mental Status Exam:  Appearance:   Casual and Well Groomed     Behavior:  Appropriate  Motor:  Normal  Speech/Language:   Clear and Coherent and Normal Rate  Affect:  Appropriate  Mood:  anxious and sad  Thought process:  normal  Thought content:    Rumination  Sensory/Perceptual disturbances:    WNL and Flashback  Orientation:  x4  Attention:  Good  Concentration:  Fair  Memory:  WNL  Fund of knowledge:   Good  Insight:    Good  Judgment:   Good  Impulse Control:  Good   Risk Assessment: Danger to Self:  No Self-injurious Behavior: No Danger to Others: No Duty to Warn:no Physical Aggression / Violence:No  Access to Firearms a concern: No  Gang Involvement:No   Virtual Visit via VIDEO: I connected with client by MyChart video enabled telemedicine/telehealth application, with their informed consent, and verified client privacy and that I am speaking with the correct person using two identifiers. I discussed the limitations, risks, security and privacy concerns of performing psychotherapy and management service virtually and confirmed their location. I also discussed with the patient that there may be a patient responsible charge related to this service and to confirm with the front desk if their insurance covers teletherapy. I also discussed with the patient the availability of in person appointments. The patient expressed understanding and agreed to proceed. I discussed the treatment planning with the client. The client was provided an opportunity to ask questions and all were answered. The client agreed with the plan and demonstrated an understanding of the instructions. The client was advised to call our office if symptoms worsen or feel they are in a crisis state and  need immediate contact. Client also reminded of a crisis line number and to use 9-1-1 if there's an emergency.  Therapist Location: office; Client Location: home  Subjective: Client discussed family stressors sucking the energy out of her and making it hard for her to process her own emotions. Client reported talking with her brother before her own counseling sessions and it being a bad idea because it drained her so much she felt she couldn't care for herself (feeling overextended). Client reported that her daughter on the same day kept pestering her and asking her to reassure her that she is okay and asking the client to regulate her. Client reported feeling like she is at the end of her rope with her daughter, having done all she can to help teach her daughter how to care for herself as an adult. Therapist used MI & CBT with client to validate her feelings and help her process her thoughts about her daughter's condition and tendencies. Therapist also used BSP to help client regulate and ground and find a hopeful thought she can process and turn to when she is feeling helpless to help her daughter. Therapist also wrapped up with client using termination with client since therapist is going on maternity leave and client found alternative therapist. Therapist assessed for safety and stability and client denied SI/HI/AVH.   Interventions: Cognitive Behavioral Therapy, Motivational Interviewing, and BSP  & RPT  Diagnosis: No diagnosis found.    Plan: Mood independent behavior, keeping boundaries without guilt, positive self talk, self-care, assertiveness. Client to learn how to process grief  and sadness without a physical sensation. Client to create boundaries with family to ensure she is caring for her needs first. Progress: Client progressed and learned that she cant share her vulnerable emotions with others in her support network or esp her family who have a lot of emotional feelings they want to  project on her. Client made progress honoring herself and her time, and seeing a need to change something by getting off the phone call with her family when they only dump on her and by learning how she may draw that boundary with them and honor her own time. Client making progress setting expectations with those shes in relationship with to keep her boundaries or assess if she needs to continue the relationship/friendship. Client reported also less guilt about not paying for all her mom's needs/wants and therefore not bailing her out, putting herself in a bad situation.   Barnie Del, LCSW, LCAS, CCTP, CCS, BSP

## 2022-01-07 DIAGNOSIS — Z0000 Encounter for general adult medical examination without abnormal findings: Secondary | ICD-10-CM | POA: Diagnosis not present

## 2022-01-07 DIAGNOSIS — E782 Mixed hyperlipidemia: Secondary | ICD-10-CM | POA: Diagnosis not present

## 2022-01-07 DIAGNOSIS — J029 Acute pharyngitis, unspecified: Secondary | ICD-10-CM | POA: Diagnosis not present

## 2022-01-09 DIAGNOSIS — F4320 Adjustment disorder, unspecified: Secondary | ICD-10-CM | POA: Diagnosis not present

## 2022-01-10 ENCOUNTER — Ambulatory Visit: Payer: BC Managed Care – PPO | Admitting: Addiction (Substance Use Disorder)

## 2022-01-10 DIAGNOSIS — E782 Mixed hyperlipidemia: Secondary | ICD-10-CM | POA: Diagnosis not present

## 2022-01-11 ENCOUNTER — Ambulatory Visit (INDEPENDENT_AMBULATORY_CARE_PROVIDER_SITE_OTHER): Payer: BC Managed Care – PPO | Admitting: *Deleted

## 2022-01-11 ENCOUNTER — Telehealth: Payer: Self-pay | Admitting: Allergy and Immunology

## 2022-01-11 DIAGNOSIS — J309 Allergic rhinitis, unspecified: Secondary | ICD-10-CM

## 2022-01-11 NOTE — Telephone Encounter (Signed)
Patient states she is going to see the Rheumatologist in three weeks.    Patient took ibuprofen once before and it caused her bronchospasms, the rheumatologist patient is seeing is wondering if patient needs to be on anti inflammatory medications and if so would it be okay to put her on them?   Patient did want to say she has no history of going into anaphylactic shock.   Please advise  Best contact number: [phone]

## 2022-01-11 NOTE — Telephone Encounter (Signed)
Dr. Kozlow please advice. Thank you 

## 2022-01-14 NOTE — Telephone Encounter (Signed)
Patient informed and verbalized understanding

## 2022-01-16 DIAGNOSIS — F4320 Adjustment disorder, unspecified: Secondary | ICD-10-CM | POA: Diagnosis not present

## 2022-01-17 ENCOUNTER — Ambulatory Visit (INDEPENDENT_AMBULATORY_CARE_PROVIDER_SITE_OTHER): Payer: BC Managed Care – PPO

## 2022-01-17 ENCOUNTER — Ambulatory Visit: Payer: BC Managed Care – PPO | Admitting: Addiction (Substance Use Disorder)

## 2022-01-17 DIAGNOSIS — J309 Allergic rhinitis, unspecified: Secondary | ICD-10-CM

## 2022-01-24 ENCOUNTER — Ambulatory Visit: Payer: BC Managed Care – PPO | Admitting: Addiction (Substance Use Disorder)

## 2022-01-24 NOTE — Progress Notes (Signed)
   CC:  headaches  Follow-up Visit  Last visit: 08/23/21  Brief HPI: 51 year old female with a history of depression, GAD, asthma who follows in clinic for migraines and primary sex headaches.  At her last visit, MRI brain and CTA head were ordered. Topamax was started for migraine prevention and Maxalt was started for rescue.  Interval History: Headaches have improved with Topamax. She is only taking 25 mg QHS because she did not realize she could increase the dose. She has been tolerating this well. She now only gets headaches around her menstrual cycle. Her periods are currently unpredictable because she is in perimenopause. She will still get mild headaches during intercourse but they are not nearly as severe. Maxalt did resolve her migraine, but she has only needed to take it a couple of times as she has not had any debilitating headaches for several months. She will typically take Tylenol as needed which does help.  Brain MRI 09/18/21 and CTA head 09/24/21 were normal.  Headache days per month: 10 Headache free days per month: 20  Current Headache Regimen: Preventative: Topamax 25 mg QHS Abortive: Maxalt 10 mg PRN, Tylenol   Prior Therapies                                  Tylenol Tramadol Maxalt 10 mg PRN Prozac 20 mg daily Topamax 25 mg QHS Magnesium  Physical Exam:   Vital Signs: BP 116/74   Pulse 66   Ht 5\' 9"  (1.753 m)   Wt 232 lb 2 oz (105.3 kg)   BMI 34.28 kg/m  GENERAL:  well appearing, in no acute distress, alert  SKIN:  Color, texture, turgor normal. No rashes or lesions HEAD:  Normocephalic/atraumatic. RESP: normal respiratory effort MSK:  No gross joint deformities.   NEUROLOGICAL: Mental Status: Alert, oriented to person, place and time, Follows commands, and Speech fluent and appropriate. Cranial Nerves: PERRL, face symmetric, no dysarthria, hearing grossly intact Motor: moves all extremities equally Gait: normal-based.  IMPRESSION: 51 year old  female with a history of depression, GAD, asthma who presents for follow up of migraines and primary sex headache. MRI brain and CTA head were unremarkable. She has had improvement in her headaches since starting Topamax. Has only been taking 25 mg at bedtime as she did not realize she could increase it. Will increase dose to 50 mg QHS and continue Maxalt for rescue.  PLAN: -Prevention: Increase Topamax to 50 mg QHS, uptitrate further as needed -Rescue: Continue Maxalt 10 mg PRN   Follow-up: 3 months  I spent a total of 18 minutes on the date of the service. Headache education was done. Discussed treatment options including preventive and acute medications. Discussed medication overuse headache and to limit use of acute treatments to no more than 2 days/week or 10 days/month. Discussed medication side effects, adverse reactions and drug interactions. Written educational materials and patient instructions outlining all of the above were given.  Genia Harold, MD 01/28/22 1:51 PM

## 2022-01-25 ENCOUNTER — Ambulatory Visit (INDEPENDENT_AMBULATORY_CARE_PROVIDER_SITE_OTHER): Payer: BC Managed Care – PPO

## 2022-01-25 DIAGNOSIS — J309 Allergic rhinitis, unspecified: Secondary | ICD-10-CM | POA: Diagnosis not present

## 2022-01-28 ENCOUNTER — Ambulatory Visit (INDEPENDENT_AMBULATORY_CARE_PROVIDER_SITE_OTHER): Payer: BC Managed Care – PPO | Admitting: Psychiatry

## 2022-01-28 VITALS — BP 116/74 | HR 66 | Ht 69.0 in | Wt 232.1 lb

## 2022-01-28 DIAGNOSIS — G43109 Migraine with aura, not intractable, without status migrainosus: Secondary | ICD-10-CM | POA: Diagnosis not present

## 2022-01-28 MED ORDER — RIZATRIPTAN BENZOATE 10 MG PO TABS: 10.0000 mg | ORAL_TABLET | ORAL | 6 refills | Status: DC | PRN

## 2022-01-28 MED ORDER — TOPIRAMATE 50 MG PO TABS: 50.0000 mg | ORAL_TABLET | Freq: Every day | ORAL | 3 refills | Status: DC

## 2022-01-29 DIAGNOSIS — M79641 Pain in right hand: Secondary | ICD-10-CM | POA: Diagnosis not present

## 2022-01-29 DIAGNOSIS — R768 Other specified abnormal immunological findings in serum: Secondary | ICD-10-CM | POA: Diagnosis not present

## 2022-01-29 DIAGNOSIS — M79642 Pain in left hand: Secondary | ICD-10-CM | POA: Diagnosis not present

## 2022-01-29 DIAGNOSIS — M064 Inflammatory polyarthropathy: Secondary | ICD-10-CM | POA: Diagnosis not present

## 2022-01-30 DIAGNOSIS — F4320 Adjustment disorder, unspecified: Secondary | ICD-10-CM | POA: Diagnosis not present

## 2022-01-31 ENCOUNTER — Telehealth: Payer: BC Managed Care – PPO | Admitting: Addiction (Substance Use Disorder)

## 2022-02-06 ENCOUNTER — Telehealth: Payer: Self-pay | Admitting: Allergy and Immunology

## 2022-02-06 DIAGNOSIS — F4320 Adjustment disorder, unspecified: Secondary | ICD-10-CM | POA: Diagnosis not present

## 2022-02-06 DIAGNOSIS — J45998 Other asthma: Secondary | ICD-10-CM | POA: Diagnosis not present

## 2022-02-06 NOTE — Telephone Encounter (Signed)
Nebulizer is waiting on pt to pick up

## 2022-02-06 NOTE — Telephone Encounter (Signed)
Pt informed to come to the office to pick up nebulizer and to sign form she stated understanding

## 2022-02-06 NOTE — Telephone Encounter (Signed)
Patient stated that she called in earlier about a nebulizer and was calling to check on the status. Patient states that she would like to get the nebulizer today by 3:30. I told patient we can't promise to have an answer by then but I will ask clinical staff to contact her as soon as possible. Patients call back number is [phone]

## 2022-02-06 NOTE — Telephone Encounter (Signed)
Is it okay to give pt a new nebulizer?

## 2022-02-06 NOTE — Telephone Encounter (Signed)
Patient states she is in need of a nebulizer, she got one about 20 years ago.   Please advise  Best contact number: [phone]

## 2022-02-07 ENCOUNTER — Ambulatory Visit: Payer: BC Managed Care – PPO | Admitting: Addiction (Substance Use Disorder)

## 2022-02-07 ENCOUNTER — Ambulatory Visit (INDEPENDENT_AMBULATORY_CARE_PROVIDER_SITE_OTHER): Payer: BC Managed Care – PPO

## 2022-02-07 DIAGNOSIS — J309 Allergic rhinitis, unspecified: Secondary | ICD-10-CM

## 2022-02-13 DIAGNOSIS — R051 Acute cough: Secondary | ICD-10-CM | POA: Diagnosis not present

## 2022-02-13 DIAGNOSIS — J029 Acute pharyngitis, unspecified: Secondary | ICD-10-CM | POA: Diagnosis not present

## 2022-02-16 DIAGNOSIS — F4320 Adjustment disorder, unspecified: Secondary | ICD-10-CM | POA: Diagnosis not present

## 2022-02-18 ENCOUNTER — Encounter: Payer: Self-pay | Admitting: Physician Assistant

## 2022-02-18 ENCOUNTER — Telehealth (INDEPENDENT_AMBULATORY_CARE_PROVIDER_SITE_OTHER): Payer: BC Managed Care – PPO | Admitting: Physician Assistant

## 2022-02-18 DIAGNOSIS — F4310 Post-traumatic stress disorder, unspecified: Secondary | ICD-10-CM

## 2022-02-18 DIAGNOSIS — F411 Generalized anxiety disorder: Secondary | ICD-10-CM

## 2022-02-18 DIAGNOSIS — F3281 Premenstrual dysphoric disorder: Secondary | ICD-10-CM

## 2022-02-18 DIAGNOSIS — F3341 Major depressive disorder, recurrent, in partial remission: Secondary | ICD-10-CM

## 2022-02-18 MED ORDER — CLONAZEPAM 1 MG PO TABS: 0.5000 mg | ORAL_TABLET | Freq: Two times a day (BID) | ORAL | 1 refills | Status: DC | PRN

## 2022-02-18 MED ORDER — BUPROPION HCL ER (XL) 150 MG PO TB24: 150.0000 mg | ORAL_TABLET | Freq: Every day | ORAL | 3 refills | Status: DC

## 2022-02-18 MED ORDER — FLUOXETINE HCL 20 MG PO CAPS: ORAL_CAPSULE | ORAL | 3 refills | Status: DC

## 2022-02-18 MED ORDER — FLUOXETINE HCL 10 MG PO CAPS
ORAL_CAPSULE | ORAL | 3 refills | Status: DC
Start: 2022-02-18 — End: 2022-06-06

## 2022-02-18 NOTE — Progress Notes (Signed)
[facility]  Patient ID: Sherry Adams,  MRN: [id number]  PCP: Jarrett Soho, PA-C  Date of Evaluation: 02/18/2022 Time spent:20 minutes  Chief Complaint:  Chief Complaint   Anxiety; Depression; Follow-up   Virtual Visit via Telehealth  I connected with patient by a video enabled telemedicine application with their informed consent, and verified patient privacy and that I am speaking with the correct person using two identifiers.  I am private, in my office and the patient is at home.  I discussed the limitations, risks, security and privacy concerns of performing an evaluation and management service by telephone video and the availability of in person appointments. I also discussed with the patient that there may be a patient responsible charge related to this service. The patient expressed understanding and agreed to proceed.   I discussed the assessment and treatment plan with the patient. The patient was provided an opportunity to ask questions and all were answered. The patient agreed with the plan and demonstrated an understanding of the instructions.   The patient was advised to call back or seek an in-person evaluation if the symptoms worsen or if the condition fails to improve as anticipated.  I provided 20 minutes of non-face-to-face time during this encounter.  HISTORY/CURRENT STATUS: HPI For routine med check.  Sherry Adams is at home sick with RSV right now.  Prior to getting this virus states she was feeling great.  Feels that her mental health medications are perfect and the doses are just right. Patient is able to enjoy things.  Energy and motivation are not great right now but only because she is sick.  Work is going well.   No extreme sadness, tearfulness, or feelings of hopelessness.  Sleeps well most of the time. ADLs and personal hygiene are normal.   Denies any changes in concentration, making decisions, or remembering things.  Appetite has not changed.   Weight is stable. Denies suicidal or homicidal thoughts.  She does get anxious at times and takes the Klonopin infrequently.  She does not usually have panic attacks, just more of a strong sense of unease when she gets overwhelmed.  Klonopin helps. PMDD is controlled with the Prozac.  Patient denies increased energy with decreased need for sleep, increased talkativeness, racing thoughts, impulsivity or risky behaviors, increased spending, increased libido, grandiosity, increased irritability or anger, paranoia, or hallucinations.  Denies dizziness, syncope, seizures, numbness, tingling, tremor, tics, unsteady gait, slurred speech, confusion. Denies muscle or joint pain, stiffness, or dystonia.  Individual Medical History/ Review of Systems: Changes? :Yes    Past medications for mental health diagnoses include: Zoloft, Effexor, Prozac, Klonopin, Wellbutrin  Allergies: Cetirizine, Dog epithelium allergy skin test, Ibuprofen, and Nsaids  Current Medications:  Current Outpatient Medications:    albuterol (ACCUNEB) 1.25 MG/3ML nebulizer solution, Take 3 mLs (1.25 mg total) by nebulization every 6 (six) hours as needed for wheezing., Disp: 100 mL, Rfl: 1   albuterol (VENTOLIN HFA) 108 (90 Base) MCG/ACT inhaler, INHALE 1 PUFF INTO THE LUNGS EVERY 6 HOURS AS NEEDED FOR WHEEZING OR SHORTNESS OF BREATH., Disp: 18 each, Rfl: 1   budesonide-formoterol (SYMBICORT) 160-4.5 MCG/ACT inhaler, TAKE 2 PUFFS BY MOUTH TWICE A DAY, Disp: 30.6 each, Rfl: 1   buPROPion (WELLBUTRIN XL) 150 MG 24 hr tablet, 150 mg daily., Disp: , Rfl:    clonazePAM (KLONOPIN) 1 MG tablet, Take 0.5-1 tablets (0.5-1 mg total) by mouth 2 (two) times daily as needed. 1/2 - 1 tab, Disp: 30 tablet, Rfl: 1  diphenhydrAMINE HCl (ALLERGY MEDICATION PO), Take by mouth., Disp: , Rfl:    EPINEPHrine 0.3 mg/0.3 mL IJ SOAJ injection, Inject 0.3 mg into the muscle as needed for anaphylaxis., Disp: 2 each, Rfl: 1   fexofenadine (ALLEGRA) 180 MG  tablet, Take 1 tablet (180 mg total) by mouth daily., Disp: 90 tablet, Rfl: 1   FLUoxetine (PROZAC) 10 MG capsule, TAKE 1 CAPSULE BY MOUTH EVERY DAY ALONG WITH 20MG  CAPSULE TO =30MG  DAILY, Disp: 90 capsule, Rfl: 0   FLUoxetine (PROZAC) 20 MG capsule, 1 po daily with the 10 mg=30 mg, Disp: 180 capsule, Rfl: 3   fluticasone (FLONASE) 50 MCG/ACT nasal spray, Place 1 spray into both nostrils daily., Disp: 16 g, Rfl: 5   montelukast (SINGULAIR) 10 MG tablet, Take 1 tablet (10 mg total) by mouth at bedtime., Disp: 90 tablet, Rfl: 1   omeprazole (PRILOSEC) 20 MG capsule, Take 1 capsule (20 mg total) by mouth daily. Qod sometimes., Disp: 90 capsule, Rfl: 1   rizatriptan (MAXALT) 10 MG tablet, Take 1 tablet (10 mg total) by mouth as needed for migraine. May repeat in 2 hours if needed, Disp: 10 tablet, Rfl: 6   rosuvastatin (CRESTOR) 20 MG tablet, Take 20 mg by mouth daily. Take 1 tablet every other day, Disp: , Rfl:    Specialty Vitamins Products (MAGNESIUM, AMINO ACID CHELATE,) 133 MG tablet, Take 1 tablet by mouth 2 (two) times daily., Disp: , Rfl:    topiramate (TOPAMAX) 50 MG tablet, Take 1 tablet (50 mg total) by mouth at bedtime., Disp: 90 tablet, Rfl: 3   Cholecalciferol (VITAMIN D) 10 MCG/ML LIQD, Take 500 Units by mouth daily at 12 noon. (Patient not taking: Reported on 02/18/2022), Disp: , Rfl:  Medication Side Effects: none  Family Medical/ Social History: Changes?  Mother in law died  MENTAL HEALTH EXAM:  There were no vitals taken for this visit.There is no height or weight on file to calculate BMI.  General Appearance: Casual, Neat and Well Groomed  Eye Contact:  Good  Speech:  Clear and Coherent and Normal Rate  Volume:  Normal  Mood:  Euthymic  Affect:  Congruent  Thought Process:  Goal Directed and Descriptions of Associations: Circumstantial  Orientation:  Full (Time, Place, and Person)  Thought Content: Logical   Suicidal Thoughts:  No  Homicidal Thoughts:  No  Memory:  WNL   Judgement:  Good  Insight:  Good  Psychomotor Activity:  Normal  Concentration:  Concentration: Good and Attention Span: Good  Recall:  Good  Fund of Knowledge: Good  Language: Good  Assets:  Desire for Improvement Financial Resources/Insurance Housing Transportation Vocational/Educational  ADL's:  Intact  Cognition: WNL  Prognosis:  Good   DIAGNOSES:    ICD-10-CM   1. Recurrent major depressive disorder, in partial remission (HCC)  F33.41     2. Generalized anxiety disorder  F41.1     3. PTSD (post-traumatic stress disorder)  F43.10     4. PMDD (premenstrual dysphoric disorder)  F32.81       Receiving Psychotherapy: Yes   02/20/2022, LCSW  RECOMMENDATIONS:  PDMP reviewed.  Klonopin filled 11/14/2021. I provided 20 minutes of non-face-to-face time during this encounter, including time spent before and after the visit in records review, medical decision making, counseling pertinent to today's visit, and charting.   She is doing well as far as her mental health medications go so no changes will be made.  Continue Wellbutrin XL 150 mg, 1 p.o. every morning.  Continue Klonopin 0.5 mg 1/2-1 twice daily as needed. Continue Prozac 30 mg every morning with then increased to 40 mg daily 1 week prior to menses and then go back to 30 mg after her menses begins. Continue therapy. Return in 6 months  Melony Overly, [place]

## 2022-02-20 DIAGNOSIS — F4320 Adjustment disorder, unspecified: Secondary | ICD-10-CM | POA: Diagnosis not present

## 2022-02-22 ENCOUNTER — Ambulatory Visit (INDEPENDENT_AMBULATORY_CARE_PROVIDER_SITE_OTHER): Payer: BC Managed Care – PPO

## 2022-02-22 DIAGNOSIS — J309 Allergic rhinitis, unspecified: Secondary | ICD-10-CM

## 2022-02-27 DIAGNOSIS — F4320 Adjustment disorder, unspecified: Secondary | ICD-10-CM | POA: Diagnosis not present

## 2022-03-01 DIAGNOSIS — F4320 Adjustment disorder, unspecified: Secondary | ICD-10-CM | POA: Diagnosis not present

## 2022-03-06 DIAGNOSIS — F4320 Adjustment disorder, unspecified: Secondary | ICD-10-CM | POA: Diagnosis not present

## 2022-03-08 ENCOUNTER — Ambulatory Visit (INDEPENDENT_AMBULATORY_CARE_PROVIDER_SITE_OTHER): Payer: BC Managed Care – PPO

## 2022-03-08 DIAGNOSIS — J309 Allergic rhinitis, unspecified: Secondary | ICD-10-CM | POA: Diagnosis not present

## 2022-03-13 DIAGNOSIS — F4320 Adjustment disorder, unspecified: Secondary | ICD-10-CM | POA: Diagnosis not present

## 2022-03-14 ENCOUNTER — Other Ambulatory Visit: Payer: Self-pay | Admitting: Physician Assistant

## 2022-03-21 ENCOUNTER — Ambulatory Visit (INDEPENDENT_AMBULATORY_CARE_PROVIDER_SITE_OTHER): Payer: BC Managed Care – PPO

## 2022-03-21 DIAGNOSIS — J309 Allergic rhinitis, unspecified: Secondary | ICD-10-CM

## 2022-03-21 DIAGNOSIS — F4320 Adjustment disorder, unspecified: Secondary | ICD-10-CM | POA: Diagnosis not present

## 2022-03-28 ENCOUNTER — Ambulatory Visit (INDEPENDENT_AMBULATORY_CARE_PROVIDER_SITE_OTHER): Payer: BC Managed Care – PPO

## 2022-03-28 DIAGNOSIS — J309 Allergic rhinitis, unspecified: Secondary | ICD-10-CM | POA: Diagnosis not present

## 2022-03-29 DIAGNOSIS — F4320 Adjustment disorder, unspecified: Secondary | ICD-10-CM | POA: Diagnosis not present

## 2022-04-01 ENCOUNTER — Telehealth: Payer: Self-pay

## 2022-04-01 ENCOUNTER — Ambulatory Visit (INDEPENDENT_AMBULATORY_CARE_PROVIDER_SITE_OTHER): Payer: BC Managed Care – PPO

## 2022-04-01 ENCOUNTER — Telehealth: Payer: Self-pay | Admitting: Neurology

## 2022-04-01 DIAGNOSIS — J309 Allergic rhinitis, unspecified: Secondary | ICD-10-CM

## 2022-04-01 NOTE — Telephone Encounter (Signed)
LVM and sent mychart msg informing pt of need to reschedule 05/16/22 appointment - NP out 

## 2022-04-01 NOTE — Telephone Encounter (Signed)
error 

## 2022-04-04 DIAGNOSIS — F4320 Adjustment disorder, unspecified: Secondary | ICD-10-CM | POA: Diagnosis not present

## 2022-04-09 DIAGNOSIS — Z01411 Encounter for gynecological examination (general) (routine) with abnormal findings: Secondary | ICD-10-CM | POA: Diagnosis not present

## 2022-04-09 DIAGNOSIS — Z6832 Body mass index (BMI) 32.0-32.9, adult: Secondary | ICD-10-CM | POA: Diagnosis not present

## 2022-04-09 DIAGNOSIS — N644 Mastodynia: Secondary | ICD-10-CM | POA: Diagnosis not present

## 2022-04-10 DIAGNOSIS — Z1231 Encounter for screening mammogram for malignant neoplasm of breast: Secondary | ICD-10-CM | POA: Diagnosis not present

## 2022-04-12 DIAGNOSIS — F4320 Adjustment disorder, unspecified: Secondary | ICD-10-CM | POA: Diagnosis not present

## 2022-04-15 ENCOUNTER — Encounter: Payer: Self-pay | Admitting: *Deleted

## 2022-04-16 DIAGNOSIS — J069 Acute upper respiratory infection, unspecified: Secondary | ICD-10-CM | POA: Diagnosis not present

## 2022-04-16 DIAGNOSIS — R0981 Nasal congestion: Secondary | ICD-10-CM | POA: Diagnosis not present

## 2022-04-16 DIAGNOSIS — R051 Acute cough: Secondary | ICD-10-CM | POA: Diagnosis not present

## 2022-04-16 DIAGNOSIS — J029 Acute pharyngitis, unspecified: Secondary | ICD-10-CM | POA: Diagnosis not present

## 2022-04-26 ENCOUNTER — Ambulatory Visit (INDEPENDENT_AMBULATORY_CARE_PROVIDER_SITE_OTHER): Payer: BC Managed Care – PPO | Admitting: *Deleted

## 2022-04-26 DIAGNOSIS — J309 Allergic rhinitis, unspecified: Secondary | ICD-10-CM | POA: Diagnosis not present

## 2022-05-01 DIAGNOSIS — F4320 Adjustment disorder, unspecified: Secondary | ICD-10-CM | POA: Diagnosis not present

## 2022-05-14 ENCOUNTER — Ambulatory Visit (INDEPENDENT_AMBULATORY_CARE_PROVIDER_SITE_OTHER): Payer: BC Managed Care – PPO | Admitting: Allergy and Immunology

## 2022-05-14 ENCOUNTER — Other Ambulatory Visit: Payer: Self-pay

## 2022-05-14 ENCOUNTER — Ambulatory Visit: Payer: Self-pay

## 2022-05-14 ENCOUNTER — Encounter: Payer: Self-pay | Admitting: Allergy and Immunology

## 2022-05-14 VITALS — BP 112/68 | HR 71 | Temp 97.7°F | Resp 14 | Ht 69.0 in | Wt 233.8 lb

## 2022-05-14 DIAGNOSIS — J309 Allergic rhinitis, unspecified: Secondary | ICD-10-CM | POA: Diagnosis not present

## 2022-05-14 DIAGNOSIS — J342 Deviated nasal septum: Secondary | ICD-10-CM | POA: Diagnosis not present

## 2022-05-14 DIAGNOSIS — J4540 Moderate persistent asthma, uncomplicated: Secondary | ICD-10-CM

## 2022-05-14 DIAGNOSIS — K219 Gastro-esophageal reflux disease without esophagitis: Secondary | ICD-10-CM

## 2022-05-14 DIAGNOSIS — J3089 Other allergic rhinitis: Secondary | ICD-10-CM

## 2022-05-14 DIAGNOSIS — T781XXD Other adverse food reactions, not elsewhere classified, subsequent encounter: Secondary | ICD-10-CM

## 2022-05-14 DIAGNOSIS — J301 Allergic rhinitis due to pollen: Secondary | ICD-10-CM

## 2022-05-14 MED ORDER — BUDESONIDE-FORMOTEROL FUMARATE 160-4.5 MCG/ACT IN AERO: 2.0000 | INHALATION_SPRAY | Freq: Two times a day (BID) | RESPIRATORY_TRACT | 1 refills | Status: DC

## 2022-05-14 MED ORDER — FLUTICASONE PROPIONATE 50 MCG/ACT NA SUSP: 1.0000 | Freq: Every day | NASAL | 5 refills | Status: DC

## 2022-05-14 MED ORDER — ALBUTEROL SULFATE 1.25 MG/3ML IN NEBU: 1.0000 | INHALATION_SOLUTION | Freq: Four times a day (QID) | RESPIRATORY_TRACT | 1 refills | Status: DC | PRN

## 2022-05-14 MED ORDER — EPINEPHRINE 0.3 MG/0.3ML IJ SOAJ: 0.3000 mg | INTRAMUSCULAR | 1 refills | Status: DC | PRN

## 2022-05-14 MED ORDER — MONTELUKAST SODIUM 10 MG PO TABS: 10.0000 mg | ORAL_TABLET | Freq: Every evening | ORAL | 1 refills | Status: DC

## 2022-05-14 MED ORDER — OMEPRAZOLE 20 MG PO CPDR: 20.0000 mg | DELAYED_RELEASE_CAPSULE | Freq: Every morning | ORAL | 1 refills | Status: DC

## 2022-05-14 MED ORDER — ALBUTEROL SULFATE HFA 108 (90 BASE) MCG/ACT IN AERS: INHALATION_SPRAY | RESPIRATORY_TRACT | 1 refills | Status: DC

## 2022-05-14 MED ORDER — FEXOFENADINE HCL 180 MG PO TABS: 180.0000 mg | ORAL_TABLET | Freq: Every day | ORAL | 1 refills | Status: DC | PRN

## 2022-05-14 MED ORDER — FAMOTIDINE 40 MG PO TABS: 40.0000 mg | ORAL_TABLET | Freq: Every evening | ORAL | 1 refills | Status: DC

## 2022-05-14 MED ORDER — PREDNISONE 10 MG PO TABS: 10.0000 mg | ORAL_TABLET | Freq: Every morning | ORAL | 0 refills | Status: AC

## 2022-05-14 NOTE — Progress Notes (Unsigned)
[facility] [place] [facility] [place]   Follow-up Note  Referring Provider: No ref. provider found Primary Provider: Marda Stalker, PA-C Date of Office Visit: 05/14/2022  Subjective:   Sherry Adams (DOB: May 17, 1970) is a 52 y.o. female who returns to the [facility] on 05/14/2022 in re-evaluation of the following:  HPI: Sherry Adams presents to this clinic in reevaluation of asthma, allergic rhinoconjunctivitis, and oral allergy syndrome.  I last saw her in this clinic 06 November 2021.  She has been using immunotherapy.  However, she has been sick twice since I have seen her in this clinic, once with RSV, once with a viral upper respiratory tract infection about 1 month ago, which has resulted in some interference with her immunotherapy schedule.  And as well she gets "wiped out" for 24 hours with lots of fatigue after she gets an injection.  She does not have any associated systemic or constitutional symptoms.  With her last viral upper respiratory tract infection which occurred about 1 month ago she has had lots of throat congestion and throat clearing.  She does not have any anosmia or decreased ability to taste but might have a little bit of stuffiness.  Her right nostril is always a little more stuffy than her left nostril because of her deviated septum.  She increased her Symbicort to twice a day but this did not really make much of an effect.  It should be noted that she definitely has problems with reflux and has been a little bit more active and she has regurgitation when laying down and she is only using omeprazole 20 mg a day.  She has not had any problems with her oral allergy syndrome and she has expanded her diet while using immunotherapy.  Allergies as of 05/14/2022       Reactions   Cetirizine Photosensitivity, Other (See Comments)   Dog Epithelium Allergy Skin Test Itching   Asthma?   Ibuprofen Other (See Comments)   "bronchospams"    Nsaids Nausea Only        Medication List    albuterol 1.25 MG/3ML nebulizer solution Commonly known as: ACCUNEB Take 3 mLs (1.25 mg total) by nebulization every 6 (six) hours as needed for wheezing.   albuterol 108 (90 Base) MCG/ACT inhaler Commonly known as: VENTOLIN HFA INHALE 1 PUFF INTO THE LUNGS EVERY 6 HOURS AS NEEDED FOR WHEEZING OR SHORTNESS OF BREATH.   ALLERGY MEDICATION PO Take by mouth.   budesonide-formoterol 160-4.5 MCG/ACT inhaler Commonly known as: SYMBICORT TAKE 2 PUFFS BY MOUTH TWICE A DAY   buPROPion 150 MG 24 hr tablet Commonly known as: WELLBUTRIN XL Take 1 tablet (150 mg total) by mouth daily.   clonazePAM 1 MG tablet Commonly known as: KLONOPIN Take 0.5-1 tablets (0.5-1 mg total) by mouth 2 (two) times daily as needed. 1/2 - 1 tab   EPINEPHrine 0.3 mg/0.3 mL Soaj injection Commonly known as: EPI-PEN Inject 0.3 mg into the muscle as needed for anaphylaxis.   fexofenadine 180 MG tablet Commonly known as: ALLEGRA Take 1 tablet (180 mg total) by mouth daily.   FLUoxetine 10 MG capsule Commonly known as: PROZAC 1 daily with the 20 mg=30 mg daily.   FLUoxetine 20 MG capsule Commonly known as: PROZAC TAKE 1 CAPSULE WITH 10MG. THE WEEK PRIOR TO MENSES TAKE 2 CAPSULES AND DECREASE TO 30MG AFTER MENSES BEGINS   fluticasone 50 MCG/ACT nasal spray Commonly known as: FLONASE Place 1 spray into both nostrils  daily.   magnesium (amino acid chelate) 133 MG tablet Take 1 tablet by mouth 2 (two) times daily.   montelukast 10 MG tablet Commonly known as: SINGULAIR Take 1 tablet (10 mg total) by mouth at bedtime.   omeprazole 20 MG capsule Commonly known as: PRILOSEC Take 1 capsule (20 mg total) by mouth daily. Qod sometimes.   rizatriptan 10 MG tablet Commonly known as: Maxalt Take 1 tablet (10 mg total) by mouth as needed for migraine. May repeat in 2 hours if needed   rosuvastatin 20 MG tablet Commonly known as: CRESTOR Take 20 mg by mouth  daily. Take 1 tablet every other day   topiramate 50 MG tablet Commonly known as: Topamax Take 1 tablet (50 mg total) by mouth at bedtime.   Vitamin D 10 MCG/ML Liqd Take 500 Units by mouth daily at 12 noon.    Past Medical History:  Diagnosis Date   Asthma    Endometriosis    Frequent headaches    GERD (gastroesophageal reflux disease)    Mixed hyperlipidemia    Panic attacks     Past Surgical History:  Procedure Laterality Date   LAPAROSCOPY     for endometriosis   WISDOM TOOTH EXTRACTION      Review of systems negative except as noted in HPI / PMHx or noted below:  Review of Systems  Constitutional: Negative.   HENT: Negative.    Eyes: Negative.   Respiratory: Negative.    Cardiovascular: Negative.   Gastrointestinal: Negative.   Genitourinary: Negative.   Musculoskeletal: Negative.   Skin: Negative.   Neurological: Negative.   Endo/Heme/Allergies: Negative.   Psychiatric/Behavioral: Negative.       Objective:   Vitals:   05/14/22 1129  BP: 112/68  Pulse: 71  Resp: 14  Temp: 97.7 F (36.5 C)  SpO2: 95%   Height: 5' 9"$  (175.3 cm)  Weight: 233 lb 12.8 oz (106.1 kg)   Physical Exam Constitutional:      Appearance: She is not diaphoretic.  HENT:     Head: Normocephalic.     Right Ear: Tympanic membrane, ear canal and external ear normal.     Left Ear: Tympanic membrane, ear canal and external ear normal.     Nose: Septal deviation and mucosal edema present. No rhinorrhea.     Mouth/Throat:     Pharynx: Uvula midline. No oropharyngeal exudate.  Eyes:     Conjunctiva/sclera: Conjunctivae normal.  Neck:     Thyroid: No thyromegaly.     Trachea: Trachea normal. No tracheal tenderness or tracheal deviation.  Cardiovascular:     Rate and Rhythm: Normal rate and regular rhythm.     Heart sounds: Normal heart sounds, S1 normal and S2 normal. No murmur heard. Pulmonary:     Effort: No respiratory distress.     Breath sounds: Normal breath sounds.  No stridor. No wheezing or rales.  Lymphadenopathy:     Head:     Right side of head: No tonsillar adenopathy.     Left side of head: No tonsillar adenopathy.     Cervical: No cervical adenopathy.  Skin:    Findings: No erythema or rash.     Nails: There is no clubbing.  Neurological:     Mental Status: She is alert.     Diagnostics:    Spirometry was performed and demonstrated an FEV1 of 3.0 at 94 % of predicted.  The patient had an Asthma Control Test with the following results: ACT Total Score:  14.    Assessment and Plan:   1. Asthma, moderate persistent, well-controlled   2. Perennial allergic rhinitis   3. Seasonal allergic rhinitis due to pollen   4. Pollen-food allergy, subsequent encounter   5. Deviated septum   6. LPRD (laryngopharyngeal reflux disease)    1.  Allergen avoidance measures -dust mite, cat, dog, pollens, cockroach    2.  Treat and prevent inflammation:  A. Symbicort 160 - 2 inhalations 1-2 times per day   B. Flonase - 1-2 sprays each nostril 1 time per day C. Montelukast 10 mg - 1 tablet 1 time per day D. Immunotherapy  E. Prednisone 10 mg - 1 tablet 1 time a day for 10 days only  3. Treat LPR:  A. Omeprazole 40 mg - 1 tab in AM B. Famotidine 40 - 1 tab in PM C.  Replace throat clearing with swallowing/drinking maneuver  4. If needed:  A. Fexofenadine 180 - 1 tablet 1 time per day B. Albuterol HFA - 2 inhalations 1 time per day C. Auvi-Q 0.3, benadryl, MD/ER evaluation for allergic reaction  4. Return to clinic in 6 months or earlier if problem  5. Obtain fall flu vaccine  I think that [place] has rolled over into some LPR since her viral respiratory tract infections and I am going to treat her with therapy for LPR for the next few months and I have asked her to contact me noting her response to this therapy.  Her airway may also be a little bit inflamed but certainly her lung function is fine I do not think we have to worry about her  asthma but I did give her a low-dose of prednisone to help with any upper airway inflammation that may be present.  Assuming she does well with the plan noted above I will see her back in this clinic in 6 months or earlier if there is a problem.  Allena Katz, MD [facility] [place]

## 2022-05-14 NOTE — Patient Instructions (Addendum)
  1.  Allergen avoidance measures -dust mite, cat, dog, pollens, cockroach    2.  Treat and prevent inflammation:  A. Symbicort 160 - 2 inhalations 1-2 times per day   B. Flonase - 1-2 sprays each nostril 1 time per day C. Montelukast 10 mg - 1 tablet 1 time per day D. Immunotherapy  E. Prednisone 10 mg - 1 tablet 1 time a day for 10 days only  3. Treat LPR:  A. Omeprazole 40 mg - 1 tab in AM B. Famotidine 40 - 1 tab in PM C.  Replace throat clearing with swallowing/drinking maneuver  4. If needed:  A. Fexofenadine 180 - 1 tablet 1 time per day B. Albuterol HFA - 2 inhalations 1 time per day C. Auvi-Q 0.3, benadryl, MD/ER evaluation for allergic reaction  4. Return to clinic in 6 months or earlier if problem  5. Obtain fall flu vaccine

## 2022-05-15 ENCOUNTER — Encounter: Payer: Self-pay | Admitting: Allergy and Immunology

## 2022-05-15 DIAGNOSIS — Z1211 Encounter for screening for malignant neoplasm of colon: Secondary | ICD-10-CM | POA: Diagnosis not present

## 2022-05-15 DIAGNOSIS — K635 Polyp of colon: Secondary | ICD-10-CM | POA: Diagnosis not present

## 2022-05-15 DIAGNOSIS — K648 Other hemorrhoids: Secondary | ICD-10-CM | POA: Diagnosis not present

## 2022-05-16 ENCOUNTER — Ambulatory Visit: Payer: BC Managed Care – PPO | Admitting: Neurology

## 2022-05-17 DIAGNOSIS — N644 Mastodynia: Secondary | ICD-10-CM | POA: Diagnosis not present

## 2022-05-22 DIAGNOSIS — F4320 Adjustment disorder, unspecified: Secondary | ICD-10-CM | POA: Diagnosis not present

## 2022-05-24 ENCOUNTER — Ambulatory Visit (INDEPENDENT_AMBULATORY_CARE_PROVIDER_SITE_OTHER): Payer: BC Managed Care – PPO | Admitting: *Deleted

## 2022-05-24 DIAGNOSIS — J309 Allergic rhinitis, unspecified: Secondary | ICD-10-CM

## 2022-05-29 ENCOUNTER — Encounter: Payer: Self-pay | Admitting: Neurology

## 2022-05-29 ENCOUNTER — Ambulatory Visit (INDEPENDENT_AMBULATORY_CARE_PROVIDER_SITE_OTHER): Payer: BC Managed Care – PPO | Admitting: Neurology

## 2022-05-29 VITALS — BP 127/79 | HR 79 | Ht 69.0 in | Wt 231.0 lb

## 2022-05-29 DIAGNOSIS — F4320 Adjustment disorder, unspecified: Secondary | ICD-10-CM | POA: Diagnosis not present

## 2022-05-29 DIAGNOSIS — G43709 Chronic migraine without aura, not intractable, without status migrainosus: Secondary | ICD-10-CM

## 2022-05-29 HISTORY — DX: Chronic migraine without aura, not intractable, without status migrainosus: G43.709

## 2022-05-29 MED ORDER — TOPIRAMATE 50 MG PO TABS: 75.0000 mg | ORAL_TABLET | Freq: Every day | ORAL | 3 refills | Status: DC

## 2022-05-29 MED ORDER — RIZATRIPTAN BENZOATE 10 MG PO TABS: 10.0000 mg | ORAL_TABLET | ORAL | 6 refills | Status: DC | PRN

## 2022-05-29 NOTE — Patient Instructions (Signed)
Try to increase Topamax 75 mg at bedtime, continue the Maxalt as needed, see you back in 6 months, message me if needed
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
## 2022-05-31 ENCOUNTER — Ambulatory Visit (INDEPENDENT_AMBULATORY_CARE_PROVIDER_SITE_OTHER): Payer: BC Managed Care – PPO | Admitting: *Deleted

## 2022-05-31 DIAGNOSIS — J309 Allergic rhinitis, unspecified: Secondary | ICD-10-CM

## 2022-06-03 ENCOUNTER — Encounter: Payer: Self-pay | Admitting: Addiction (Substance Use Disorder)

## 2022-06-04 DIAGNOSIS — J3089 Other allergic rhinitis: Secondary | ICD-10-CM | POA: Diagnosis not present

## 2022-06-04 NOTE — Progress Notes (Signed)
VIALS EXP 06-04-23

## 2022-06-06 ENCOUNTER — Other Ambulatory Visit: Payer: Self-pay | Admitting: Physician Assistant

## 2022-06-07 ENCOUNTER — Other Ambulatory Visit: Payer: Self-pay | Admitting: Physician Assistant

## 2022-06-12 DIAGNOSIS — F4320 Adjustment disorder, unspecified: Secondary | ICD-10-CM | POA: Diagnosis not present

## 2022-06-14 ENCOUNTER — Ambulatory Visit (INDEPENDENT_AMBULATORY_CARE_PROVIDER_SITE_OTHER): Payer: BC Managed Care – PPO

## 2022-06-14 DIAGNOSIS — J309 Allergic rhinitis, unspecified: Secondary | ICD-10-CM

## 2022-06-19 DIAGNOSIS — F4320 Adjustment disorder, unspecified: Secondary | ICD-10-CM | POA: Diagnosis not present

## 2022-06-20 ENCOUNTER — Ambulatory Visit (INDEPENDENT_AMBULATORY_CARE_PROVIDER_SITE_OTHER): Payer: BC Managed Care – PPO

## 2022-06-20 DIAGNOSIS — J309 Allergic rhinitis, unspecified: Secondary | ICD-10-CM | POA: Diagnosis not present

## 2022-06-26 DIAGNOSIS — F4320 Adjustment disorder, unspecified: Secondary | ICD-10-CM | POA: Diagnosis not present

## 2022-06-28 ENCOUNTER — Ambulatory Visit (INDEPENDENT_AMBULATORY_CARE_PROVIDER_SITE_OTHER): Payer: BC Managed Care – PPO

## 2022-06-28 DIAGNOSIS — J309 Allergic rhinitis, unspecified: Secondary | ICD-10-CM | POA: Diagnosis not present

## 2022-07-03 DIAGNOSIS — F4320 Adjustment disorder, unspecified: Secondary | ICD-10-CM | POA: Diagnosis not present

## 2022-07-08 ENCOUNTER — Ambulatory Visit (INDEPENDENT_AMBULATORY_CARE_PROVIDER_SITE_OTHER): Payer: BC Managed Care – PPO | Admitting: *Deleted

## 2022-07-08 DIAGNOSIS — J309 Allergic rhinitis, unspecified: Secondary | ICD-10-CM

## 2022-07-09 DIAGNOSIS — Z713 Dietary counseling and surveillance: Secondary | ICD-10-CM | POA: Diagnosis not present

## 2022-07-17 DIAGNOSIS — F4320 Adjustment disorder, unspecified: Secondary | ICD-10-CM | POA: Diagnosis not present

## 2022-07-19 ENCOUNTER — Ambulatory Visit (INDEPENDENT_AMBULATORY_CARE_PROVIDER_SITE_OTHER): Payer: BC Managed Care – PPO

## 2022-07-19 DIAGNOSIS — J309 Allergic rhinitis, unspecified: Secondary | ICD-10-CM

## 2022-07-23 DIAGNOSIS — Z713 Dietary counseling and surveillance: Secondary | ICD-10-CM | POA: Diagnosis not present

## 2022-07-24 DIAGNOSIS — F4320 Adjustment disorder, unspecified: Secondary | ICD-10-CM | POA: Diagnosis not present

## 2022-07-26 ENCOUNTER — Ambulatory Visit (INDEPENDENT_AMBULATORY_CARE_PROVIDER_SITE_OTHER): Payer: BC Managed Care – PPO

## 2022-07-26 DIAGNOSIS — J309 Allergic rhinitis, unspecified: Secondary | ICD-10-CM

## 2022-08-02 ENCOUNTER — Ambulatory Visit (INDEPENDENT_AMBULATORY_CARE_PROVIDER_SITE_OTHER): Payer: BC Managed Care – PPO | Admitting: *Deleted

## 2022-08-02 DIAGNOSIS — J309 Allergic rhinitis, unspecified: Secondary | ICD-10-CM | POA: Diagnosis not present

## 2022-08-06 DIAGNOSIS — Z713 Dietary counseling and surveillance: Secondary | ICD-10-CM | POA: Diagnosis not present

## 2022-08-07 DIAGNOSIS — F4320 Adjustment disorder, unspecified: Secondary | ICD-10-CM | POA: Diagnosis not present

## 2022-08-09 DIAGNOSIS — F4320 Adjustment disorder, unspecified: Secondary | ICD-10-CM | POA: Diagnosis not present

## 2022-08-14 ENCOUNTER — Ambulatory Visit (INDEPENDENT_AMBULATORY_CARE_PROVIDER_SITE_OTHER): Payer: BC Managed Care – PPO

## 2022-08-14 DIAGNOSIS — J309 Allergic rhinitis, unspecified: Secondary | ICD-10-CM | POA: Diagnosis not present

## 2022-08-16 DIAGNOSIS — F4320 Adjustment disorder, unspecified: Secondary | ICD-10-CM | POA: Diagnosis not present

## 2022-08-20 DIAGNOSIS — Z713 Dietary counseling and surveillance: Secondary | ICD-10-CM | POA: Diagnosis not present

## 2022-08-21 ENCOUNTER — Encounter: Payer: Self-pay | Admitting: Physician Assistant

## 2022-08-21 ENCOUNTER — Ambulatory Visit (INDEPENDENT_AMBULATORY_CARE_PROVIDER_SITE_OTHER): Payer: BC Managed Care – PPO | Admitting: Physician Assistant

## 2022-08-21 DIAGNOSIS — R630 Anorexia: Secondary | ICD-10-CM

## 2022-08-21 DIAGNOSIS — F411 Generalized anxiety disorder: Secondary | ICD-10-CM

## 2022-08-21 DIAGNOSIS — F3341 Major depressive disorder, recurrent, in partial remission: Secondary | ICD-10-CM | POA: Diagnosis not present

## 2022-08-21 DIAGNOSIS — F3281 Premenstrual dysphoric disorder: Secondary | ICD-10-CM

## 2022-08-21 DIAGNOSIS — F4310 Post-traumatic stress disorder, unspecified: Secondary | ICD-10-CM | POA: Diagnosis not present

## 2022-08-21 DIAGNOSIS — F4320 Adjustment disorder, unspecified: Secondary | ICD-10-CM | POA: Diagnosis not present

## 2022-08-21 MED ORDER — BUPROPION HCL 75 MG PO TABS: 75.0000 mg | ORAL_TABLET | Freq: Two times a day (BID) | ORAL | 1 refills | Status: DC

## 2022-08-21 NOTE — Progress Notes (Signed)
[facility]  Patient ID: Sherry Adams,  MRN: [id number]  PCP: Jarrett Soho, PA-C  Date of Evaluation: 08/21/2022 Time spent:20 minutes  Chief Complaint:  Chief Complaint   Follow-up    HISTORY/CURRENT STATUS: HPI For routine 6 month med check.  Sherry Adams is doing well overall.  A big problem for her is no appetite, and she doesn't want to eat. Not sure if related but she thinks the Wellbutrin may be a factor. With more discussion, it's gotten worse since increasing Topamax recently. She wants to be able to eat for nutrition but also to enjoy food in social situations. She has responded very well to Wellbutrin and she doesn't want to go off it, but asks if we could decrease the dose.   Patient is able to enjoy things.  Energy and motivation are good.  Work is going well.   No extreme sadness, tearfulness, or feelings of hopelessness.  Sleeps well most of the time. ADLs and personal hygiene are normal.   Denies any changes in concentration, making decisions, or remembering things.  Anxiety is better, Klonopin helps when needed. Denies suicidal or homicidal thoughts.  Patient denies increased energy with decreased need for sleep, increased talkativeness, racing thoughts, impulsivity or risky behaviors, increased spending, increased libido, grandiosity, increased irritability or anger, paranoia, or hallucinations.  Denies dizziness, syncope, seizures, numbness, tingling, tremor, tics, unsteady gait, slurred speech, confusion. Denies muscle or joint pain, stiffness, or dystonia.  Individual Medical History/ Review of Systems: Changes? :Yes   see HPI  Past medications for mental health diagnoses include: Zoloft, Effexor, Prozac, Klonopin, Wellbutrin  Allergies: Cetirizine, Dog epithelium (canis lupus familiaris), Ibuprofen, and Nsaids  Current Medications:  Current Outpatient Medications:    albuterol (ACCUNEB) 1.25 MG/3ML nebulizer solution, Take 3 mLs (1.25 mg total) by  nebulization every 6 (six) hours as needed for wheezing., Disp: 100 mL, Rfl: 1   albuterol (VENTOLIN HFA) 108 (90 Base) MCG/ACT inhaler, INHALE 1 PUFF INTO THE LUNGS EVERY 6 HOURS AS NEEDED FOR WHEEZING OR SHORTNESS OF BREATH., Disp: 18 each, Rfl: 1   budesonide-formoterol (SYMBICORT) 160-4.5 MCG/ACT inhaler, Inhale 2 puffs into the lungs in the morning and at bedtime. TAKE 2 PUFFS BY MOUTH TWICE A DAY, Disp: 30.6 each, Rfl: 1   buPROPion (WELLBUTRIN) 75 MG tablet, Take 1 tablet (75 mg total) by mouth 2 (two) times daily with breakfast and lunch., Disp: 60 tablet, Rfl: 1   Cholecalciferol (VITAMIN D) 10 MCG/ML LIQD, Take 500 Units by mouth daily at 12 noon., Disp: , Rfl:    clonazePAM (KLONOPIN) 1 MG tablet, Take 0.5-1 tablets (0.5-1 mg total) by mouth 2 (two) times daily as needed. 1/2 - 1 tab, Disp: 30 tablet, Rfl: 1   diphenhydrAMINE HCl (ALLERGY MEDICATION PO), Take by mouth., Disp: , Rfl:    fexofenadine (ALLEGRA) 180 MG tablet, Take 1 tablet (180 mg total) by mouth daily as needed for allergies or rhinitis (Can take an extra dose during flare ups.)., Disp: 180 tablet, Rfl: 1   FLUoxetine (PROZAC) 10 MG capsule, TAKE 1 CAPSULE BY MOUTH EVERY DAY ALONG WITH 20MG  CAPSULE TO =30MG  DAILY. INCREASE TO 2 CAP DAILY THE WEEK BEFORE MENSES. THEN DECREASE BACK TO 1 PILL AFTER MENSES., Disp: 180 capsule, Rfl: 3   FLUoxetine (PROZAC) 20 MG capsule, TAKE 1 CAPSULE EVERY DAY WITH 10MG  TO EQUAL 30MG , Disp: 90 capsule, Rfl: 3   fluticasone (FLONASE) 50 MCG/ACT nasal spray, Place 1 spray into both nostrils daily., Disp: 16 g, Rfl: 5  montelukast (SINGULAIR) 10 MG tablet, Take 1 tablet (10 mg total) by mouth at bedtime., Disp: 90 tablet, Rfl: 1   omeprazole (PRILOSEC) 20 MG capsule, Take 1 capsule (20 mg total) by mouth in the morning. Qod sometimes., Disp: 90 capsule, Rfl: 1   rizatriptan (MAXALT) 10 MG tablet, Take 1 tablet (10 mg total) by mouth as needed for migraine. May repeat in 2 hours if needed, Disp: 10  tablet, Rfl: 6   rosuvastatin (CRESTOR) 20 MG tablet, Take 20 mg by mouth daily. Take 1 tablet every other day, Disp: , Rfl:    Specialty Vitamins Products (MAGNESIUM, AMINO ACID CHELATE,) 133 MG tablet, Take 1 tablet by mouth 2 (two) times daily., Disp: , Rfl:    topiramate (TOPAMAX) 50 MG tablet, Take 1.5 tablets (75 mg total) by mouth at bedtime., Disp: 135 tablet, Rfl: 3   EPINEPHrine 0.3 mg/0.3 mL IJ SOAJ injection, Inject 0.3 mg into the muscle as needed for anaphylaxis. (Patient not taking: Reported on 08/21/2022), Disp: 2 each, Rfl: 1   famotidine (PEPCID) 40 MG tablet, Take 1 tablet (40 mg total) by mouth at bedtime. (Patient not taking: Reported on 08/21/2022), Disp: 90 tablet, Rfl: 1 Medication Side Effects: none  Family Medical/ Social History: Changes? none  MENTAL HEALTH EXAM:  There were no vitals taken for this visit.There is no height or weight on file to calculate BMI.  General Appearance: Casual, Neat and Well Groomed  Eye Contact:  Good  Speech:  Clear and Coherent and Normal Rate  Volume:  Normal  Mood:  Euthymic  Affect:  Congruent  Thought Process:  Goal Directed and Descriptions of Associations: Circumstantial  Orientation:  Full (Time, Place, and Person)  Thought Content: Logical   Suicidal Thoughts:  No  Homicidal Thoughts:  No  Memory:  WNL  Judgement:  Good  Insight:  Good  Psychomotor Activity:  Normal  Concentration:  Concentration: Good and Attention Span: Good  Recall:  Good  Fund of Knowledge: Good  Language: Good  Assets:  Desire for Improvement Financial Resources/Insurance Housing Resilience Transportation Vocational/Educational  ADL's:  Intact  Cognition: WNL  Prognosis:  Good   DIAGNOSES:    ICD-10-CM   1. Recurrent major depressive disorder, in partial remission (HCC)  F33.41     2. Generalized anxiety disorder  F41.1     3. PTSD (post-traumatic stress disorder)  F43.10     4. PMDD (premenstrual dysphoric disorder)  F32.81      5. Decreased appetite  R63.0       Receiving Psychotherapy: No      RECOMMENDATIONS:  PDMP reviewed.  Klonopin filled 03/26/2022. I provided 20 minutes of face to face time during this encounter, including time spent before and after the visit in records review, medical decision making, counseling pertinent to today's visit, and charting.   Discussed the decreased appetite. Both Wellbutrin and Topamax can cause that. We agreed to decrease the Wellbutrin and see how she does. She'll disc the Topamax dose w/ neuro if decreasing the Wellbutrin isn't effective or her mood is negatively affected and we need to increase the dose again.   Change Wellbutrin to 75 mg, 1 after breakfast, (ok to increase to 1 after breakfast and 1 after lunch if mood isn't controlled.) Continue Klonopin 0.5 mg 1/2-1 twice daily as needed. Continue Prozac 30 mg every morning with then increased to 40 mg daily 1 week prior to menses and then go back to 30 mg after her menses begins.  Continue Topamax 50 mg, 1.5 pills nightly per neurology. Return in 6 weeks.  Melony Overly, PA-C

## 2022-08-23 ENCOUNTER — Ambulatory Visit (INDEPENDENT_AMBULATORY_CARE_PROVIDER_SITE_OTHER): Payer: BC Managed Care – PPO

## 2022-08-23 DIAGNOSIS — J309 Allergic rhinitis, unspecified: Secondary | ICD-10-CM | POA: Diagnosis not present

## 2022-08-28 DIAGNOSIS — F4320 Adjustment disorder, unspecified: Secondary | ICD-10-CM | POA: Diagnosis not present

## 2022-09-03 DIAGNOSIS — Z713 Dietary counseling and surveillance: Secondary | ICD-10-CM | POA: Diagnosis not present

## 2022-09-04 DIAGNOSIS — F4320 Adjustment disorder, unspecified: Secondary | ICD-10-CM | POA: Diagnosis not present

## 2022-09-10 ENCOUNTER — Ambulatory Visit (INDEPENDENT_AMBULATORY_CARE_PROVIDER_SITE_OTHER): Payer: BC Managed Care – PPO | Admitting: *Deleted

## 2022-09-10 DIAGNOSIS — J309 Allergic rhinitis, unspecified: Secondary | ICD-10-CM

## 2022-09-11 DIAGNOSIS — F4320 Adjustment disorder, unspecified: Secondary | ICD-10-CM | POA: Diagnosis not present

## 2022-09-13 ENCOUNTER — Ambulatory Visit (INDEPENDENT_AMBULATORY_CARE_PROVIDER_SITE_OTHER): Payer: BC Managed Care – PPO | Admitting: Physician Assistant

## 2022-09-13 ENCOUNTER — Encounter: Payer: Self-pay | Admitting: Physician Assistant

## 2022-09-13 DIAGNOSIS — F3341 Major depressive disorder, recurrent, in partial remission: Secondary | ICD-10-CM | POA: Diagnosis not present

## 2022-09-13 DIAGNOSIS — R630 Anorexia: Secondary | ICD-10-CM | POA: Diagnosis not present

## 2022-09-13 DIAGNOSIS — G43709 Chronic migraine without aura, not intractable, without status migrainosus: Secondary | ICD-10-CM

## 2022-09-13 DIAGNOSIS — F411 Generalized anxiety disorder: Secondary | ICD-10-CM

## 2022-09-13 MED ORDER — BUPROPION HCL 75 MG PO TABS: 112.5000 mg | ORAL_TABLET | Freq: Every morning | ORAL | 1 refills | Status: DC

## 2022-09-13 NOTE — Progress Notes (Unsigned)
[facility]  Patient ID: Sherry Adams,  MRN: [id number]  PCP: Jarrett Soho, PA-C  Date of Evaluation: 09/13/2022 Time spent:20 minutes  Chief Complaint:  Chief Complaint   Depression    HISTORY/CURRENT STATUS: HPI   We weaned off Wellbutrin, wasn't able to    Patient denies increased energy with decreased need for sleep, increased talkativeness, racing thoughts, impulsivity or risky behaviors, increased spending, increased libido, grandiosity, increased irritability or anger, paranoia, or hallucinations.  Denies dizziness, syncope, seizures, numbness, tingling, tremor, tics, unsteady gait, slurred speech, confusion. Denies muscle or joint pain, stiffness, or dystonia.  Individual Medical History/ Review of Systems: Changes? :Yes     Past medications for mental health diagnoses include: Zoloft, Effexor, Prozac, Klonopin, Wellbutrin  Allergies: Cetirizine, Dog epithelium (canis lupus familiaris), Ibuprofen, and Nsaids  Current Medications:  Current Outpatient Medications:    albuterol (ACCUNEB) 1.25 MG/3ML nebulizer solution, Take 3 mLs (1.25 mg total) by nebulization every 6 (six) hours as needed for wheezing., Disp: 100 mL, Rfl: 1   albuterol (VENTOLIN HFA) 108 (90 Base) MCG/ACT inhaler, INHALE 1 PUFF INTO THE LUNGS EVERY 6 HOURS AS NEEDED FOR WHEEZING OR SHORTNESS OF BREATH., Disp: 18 each, Rfl: 1   budesonide-formoterol (SYMBICORT) 160-4.5 MCG/ACT inhaler, Inhale 2 puffs into the lungs in the morning and at bedtime. TAKE 2 PUFFS BY MOUTH TWICE A DAY, Disp: 30.6 each, Rfl: 1   Cholecalciferol (VITAMIN D) 10 MCG/ML LIQD, Take 500 Units by mouth daily at 12 noon., Disp: , Rfl:    clonazePAM (KLONOPIN) 1 MG tablet, Take 0.5-1 tablets (0.5-1 mg total) by mouth 2 (two) times daily as needed. 1/2 - 1 tab, Disp: 30 tablet, Rfl: 1   diphenhydrAMINE HCl (ALLERGY MEDICATION PO), Take by mouth., Disp: , Rfl:    fexofenadine (ALLEGRA) 180 MG tablet, Take 1 tablet (180  mg total) by mouth daily as needed for allergies or rhinitis (Can take an extra dose during flare ups.)., Disp: 180 tablet, Rfl: 1   FLUoxetine (PROZAC) 10 MG capsule, TAKE 1 CAPSULE BY MOUTH EVERY DAY ALONG WITH 20MG  CAPSULE TO =30MG  DAILY. INCREASE TO 2 CAP DAILY THE WEEK BEFORE MENSES. THEN DECREASE BACK TO 1 PILL AFTER MENSES., Disp: 180 capsule, Rfl: 3   FLUoxetine (PROZAC) 20 MG capsule, TAKE 1 CAPSULE EVERY DAY WITH 10MG  TO EQUAL 30MG , Disp: 90 capsule, Rfl: 3   fluticasone (FLONASE) 50 MCG/ACT nasal spray, Place 1 spray into both nostrils daily., Disp: 16 g, Rfl: 5   montelukast (SINGULAIR) 10 MG tablet, Take 1 tablet (10 mg total) by mouth at bedtime., Disp: 90 tablet, Rfl: 1   omeprazole (PRILOSEC) 20 MG capsule, Take 1 capsule (20 mg total) by mouth in the morning. Qod sometimes., Disp: 90 capsule, Rfl: 1   rizatriptan (MAXALT) 10 MG tablet, Take 1 tablet (10 mg total) by mouth as needed for migraine. May repeat in 2 hours if needed, Disp: 10 tablet, Rfl: 6   rosuvastatin (CRESTOR) 20 MG tablet, Take 20 mg by mouth daily. Take 1 tablet every other day, Disp: , Rfl:    Specialty Vitamins Products (MAGNESIUM, AMINO ACID CHELATE,) 133 MG tablet, Take 1 tablet by mouth 2 (two) times daily., Disp: , Rfl:    topiramate (TOPAMAX) 50 MG tablet, Take 1.5 tablets (75 mg total) by mouth at bedtime., Disp: 135 tablet, Rfl: 3   buPROPion (WELLBUTRIN) 75 MG tablet, Take 1.5 tablets (112.5 mg total) by mouth in the morning., Disp: 60 tablet, Rfl: 1   EPINEPHrine 0.3  mg/0.3 mL IJ SOAJ injection, Inject 0.3 mg into the muscle as needed for anaphylaxis. (Patient not taking: Reported on 08/21/2022), Disp: 2 each, Rfl: 1   famotidine (PEPCID) 40 MG tablet, Take 1 tablet (40 mg total) by mouth at bedtime. (Patient not taking: Reported on 08/21/2022), Disp: 90 tablet, Rfl: 1 Medication Side Effects: none  Family Medical/ Social History: Changes? none  MENTAL HEALTH EXAM:  There were no vitals taken for this  visit.There is no height or weight on file to calculate BMI.  General Appearance: Casual, Neat and Well Groomed  Eye Contact:  Good  Speech:  Clear and Coherent and Normal Rate  Volume:  Normal  Mood:  Euthymic  Affect:  Congruent  Thought Process:  Goal Directed and Descriptions of Associations: Circumstantial  Orientation:  Full (Time, Place, and Person)  Thought Content: Logical   Suicidal Thoughts:  No  Homicidal Thoughts:  No  Memory:  WNL  Judgement:  Good  Insight:  Good  Psychomotor Activity:  Normal  Concentration:  Concentration: Good and Attention Span: Good  Recall:  Good  Fund of Knowledge: Good  Language: Good  Assets:  Desire for Improvement Financial Resources/Insurance Housing Resilience Transportation Vocational/Educational  ADL's:  Intact  Cognition: WNL  Prognosis:  Good   DIAGNOSES:  No diagnosis found.  Receiving Psychotherapy: No      RECOMMENDATIONS:  PDMP reviewed.  Klonopin filled 03/26/2022. I provided 20 minutes of face to face time during this encounter, including time spent before and after the visit in records review, medical decision making, counseling pertinent to today's visit, and charting.    Change Wellbutrin to 75 mg, to 1.5 pills q am Call next week if needed will send in SR 100 mg q am.  Continue Klonopin 0.5 mg 1/2-1 twice daily as needed. Continue Prozac 30 mg every morning with then increased to 40 mg daily 1 week prior to menses and then go back to 30 mg after her menses begins.  Continue Topamax 50 mg, nightly per neurology. Return in  3 mo   Melony Overly, [place]

## 2022-09-17 DIAGNOSIS — Z713 Dietary counseling and surveillance: Secondary | ICD-10-CM | POA: Diagnosis not present

## 2022-09-18 DIAGNOSIS — F4320 Adjustment disorder, unspecified: Secondary | ICD-10-CM | POA: Diagnosis not present

## 2022-09-24 DIAGNOSIS — Z713 Dietary counseling and surveillance: Secondary | ICD-10-CM | POA: Diagnosis not present

## 2022-09-25 ENCOUNTER — Ambulatory Visit (INDEPENDENT_AMBULATORY_CARE_PROVIDER_SITE_OTHER): Payer: BC Managed Care – PPO | Admitting: *Deleted

## 2022-09-25 DIAGNOSIS — J309 Allergic rhinitis, unspecified: Secondary | ICD-10-CM | POA: Diagnosis not present

## 2022-09-25 DIAGNOSIS — F4320 Adjustment disorder, unspecified: Secondary | ICD-10-CM | POA: Diagnosis not present

## 2022-09-30 NOTE — Progress Notes (Signed)
REPLACED ONE VIAL DUE TO NEEDLE BREAKING OFF INTO VIAL.

## 2022-10-02 ENCOUNTER — Ambulatory Visit: Payer: BC Managed Care – PPO | Admitting: Physician Assistant

## 2022-10-02 DIAGNOSIS — B070 Plantar wart: Secondary | ICD-10-CM | POA: Diagnosis not present

## 2022-10-02 DIAGNOSIS — L821 Other seborrheic keratosis: Secondary | ICD-10-CM | POA: Diagnosis not present

## 2022-10-02 DIAGNOSIS — D2262 Melanocytic nevi of left upper limb, including shoulder: Secondary | ICD-10-CM | POA: Diagnosis not present

## 2022-10-02 DIAGNOSIS — F4320 Adjustment disorder, unspecified: Secondary | ICD-10-CM | POA: Diagnosis not present

## 2022-10-02 DIAGNOSIS — L918 Other hypertrophic disorders of the skin: Secondary | ICD-10-CM | POA: Diagnosis not present

## 2022-10-02 DIAGNOSIS — D225 Melanocytic nevi of trunk: Secondary | ICD-10-CM | POA: Diagnosis not present

## 2022-10-04 ENCOUNTER — Ambulatory Visit (INDEPENDENT_AMBULATORY_CARE_PROVIDER_SITE_OTHER): Payer: BC Managed Care – PPO | Admitting: Physician Assistant

## 2022-10-04 ENCOUNTER — Encounter: Payer: Self-pay | Admitting: Physician Assistant

## 2022-10-04 DIAGNOSIS — F3341 Major depressive disorder, recurrent, in partial remission: Secondary | ICD-10-CM

## 2022-10-04 DIAGNOSIS — F411 Generalized anxiety disorder: Secondary | ICD-10-CM | POA: Diagnosis not present

## 2022-10-04 DIAGNOSIS — F4310 Post-traumatic stress disorder, unspecified: Secondary | ICD-10-CM

## 2022-10-04 MED ORDER — BUPROPION HCL 75 MG PO TABS
75.0000 mg | ORAL_TABLET | Freq: Every morning | ORAL | 3 refills | Status: DC
Start: 2022-10-04 — End: 2023-01-02

## 2022-10-04 NOTE — Progress Notes (Signed)
[facility]  Patient ID: Sherry Adams,  MRN: [id number]  PCP: Jarrett Soho, PA-C  Date of Evaluation: 10/04/2022 Time spent:20 minutes  Chief Complaint:  Chief Complaint   Anxiety; Depression; Follow-up    HISTORY/CURRENT STATUS: HPI  for 3 week med check.  At the last visit we changed Wellbutrin to short acting at a lower dose.  She was not able to tolerate the 112.5 mg so she went ahead and decreased to 75 mg.  States that it is working well.  She is able to eat now, it is not affecting her appetite like the higher dose did.  She does still have some fatigue and it is hard to get up and get going sometimes but for the most part her energy and motivation are good.  She is happy with the medications as they are right now.  Patient is able to enjoy things. Work is going well.   No extreme sadness, tearfulness, or feelings of hopelessness.  Sleeps well.  ADLs and personal hygiene are normal.   Denies any changes in concentration, making decisions, or remembering things.  Appetite has improved and she is eating more.  She does have anxiety still from time to time but she uses coping techniques in her toolbox to help.  She does take Klonopin occasionally but tries not to do it very often.  Patient denies increased energy with decreased need for sleep, increased talkativeness, racing thoughts, impulsivity or risky behaviors, increased spending, increased libido, grandiosity, increased irritability or anger, paranoia, or hallucinations.  Denies dizziness, syncope, seizures, numbness, tingling, tremor, tics, unsteady gait, slurred speech, confusion. Denies muscle or joint pain, stiffness, or dystonia.  Individual Medical History/ Review of Systems: Changes? :No     Past medications for mental health diagnoses include: Zoloft, Effexor, Prozac, Klonopin, Wellbutrin  Allergies: Cetirizine, Dog epithelium (canis lupus familiaris), Ibuprofen, and Nsaids  Current Medications:   Current Outpatient Medications:    albuterol (ACCUNEB) 1.25 MG/3ML nebulizer solution, Take 3 mLs (1.25 mg total) by nebulization every 6 (six) hours as needed for wheezing., Disp: 100 mL, Rfl: 1   albuterol (VENTOLIN HFA) 108 (90 Base) MCG/ACT inhaler, INHALE 1 PUFF INTO THE LUNGS EVERY 6 HOURS AS NEEDED FOR WHEEZING OR SHORTNESS OF BREATH., Disp: 18 each, Rfl: 1   budesonide-formoterol (SYMBICORT) 160-4.5 MCG/ACT inhaler, Inhale 2 puffs into the lungs in the morning and at bedtime. TAKE 2 PUFFS BY MOUTH TWICE A DAY, Disp: 30.6 each, Rfl: 1   Cholecalciferol (VITAMIN D) 10 MCG/ML LIQD, Take 500 Units by mouth daily at 12 noon., Disp: , Rfl:    clonazePAM (KLONOPIN) 1 MG tablet, Take 0.5-1 tablets (0.5-1 mg total) by mouth 2 (two) times daily as needed. 1/2 - 1 tab, Disp: 30 tablet, Rfl: 1   diphenhydrAMINE HCl (ALLERGY MEDICATION PO), Take by mouth., Disp: , Rfl:    fexofenadine (ALLEGRA) 180 MG tablet, Take 1 tablet (180 mg total) by mouth daily as needed for allergies or rhinitis (Can take an extra dose during flare ups.)., Disp: 180 tablet, Rfl: 1   FLUoxetine (PROZAC) 10 MG capsule, TAKE 1 CAPSULE BY MOUTH EVERY DAY ALONG WITH 20MG  CAPSULE TO =30MG  DAILY. INCREASE TO 2 CAP DAILY THE WEEK BEFORE MENSES. THEN DECREASE BACK TO 1 PILL AFTER MENSES., Disp: 180 capsule, Rfl: 3   FLUoxetine (PROZAC) 20 MG capsule, TAKE 1 CAPSULE EVERY DAY WITH 10MG  TO EQUAL 30MG , Disp: 90 capsule, Rfl: 3   fluticasone (FLONASE) 50 MCG/ACT nasal spray, Place 1 spray into  both nostrils daily., Disp: 16 g, Rfl: 5   montelukast (SINGULAIR) 10 MG tablet, Take 1 tablet (10 mg total) by mouth at bedtime., Disp: 90 tablet, Rfl: 1   omeprazole (PRILOSEC) 20 MG capsule, Take 1 capsule (20 mg total) by mouth in the morning. Qod sometimes., Disp: 90 capsule, Rfl: 1   rizatriptan (MAXALT) 10 MG tablet, Take 1 tablet (10 mg total) by mouth as needed for migraine. May repeat in 2 hours if needed, Disp: 10 tablet, Rfl: 6    rosuvastatin (CRESTOR) 20 MG tablet, Take 20 mg by mouth daily. Take 1 tablet every other day, Disp: , Rfl:    Specialty Vitamins Products (MAGNESIUM, AMINO ACID CHELATE,) 133 MG tablet, Take 1 tablet by mouth 2 (two) times daily., Disp: , Rfl:    topiramate (TOPAMAX) 50 MG tablet, Take 1.5 tablets (75 mg total) by mouth at bedtime., Disp: 135 tablet, Rfl: 3   buPROPion (WELLBUTRIN) 75 MG tablet, Take 1 tablet (75 mg total) by mouth in the morning., Disp: 90 tablet, Rfl: 3   EPINEPHrine 0.3 mg/0.3 mL IJ SOAJ injection, Inject 0.3 mg into the muscle as needed for anaphylaxis. (Patient not taking: Reported on 08/21/2022), Disp: 2 each, Rfl: 1   famotidine (PEPCID) 40 MG tablet, Take 1 tablet (40 mg total) by mouth at bedtime. (Patient not taking: Reported on 08/21/2022), Disp: 90 tablet, Rfl: 1 Medication Side Effects: none  Family Medical/ Social History: Changes? none  MENTAL HEALTH EXAM:  There were no vitals taken for this visit.There is no height or weight on file to calculate BMI.  General Appearance: Casual, Neat and Well Groomed  Eye Contact:  Good  Speech:  Clear and Coherent and Normal Rate  Volume:  Normal  Mood:  Euthymic  Affect:  Congruent  Thought Process:  Goal Directed and Descriptions of Associations: Circumstantial  Orientation:  Full (Time, Place, and Person)  Thought Content: Logical   Suicidal Thoughts:  No  Homicidal Thoughts:  No  Memory:  WNL  Judgement:  Good  Insight:  Good  Psychomotor Activity:  Normal  Concentration:  Concentration: Good and Attention Span: Good  Recall:  Good  Fund of Knowledge: Good  Language: Good  Assets:  Desire for Improvement Financial Resources/Insurance Housing Physical Health Resilience Transportation Vocational/Educational  ADL's:  Intact  Cognition: WNL  Prognosis:  Good   DIAGNOSES:    ICD-10-CM   1. Recurrent major depressive disorder, in partial remission (HCC)  F33.41     2. Generalized anxiety disorder  F41.1      3. PTSD (post-traumatic stress disorder)  F43.10      Receiving Psychotherapy: No      RECOMMENDATIONS:  PDMP reviewed.  Klonopin filled 03/26/2022. I provided 20 minutes of face to face time during this encounter, including time spent before and after the visit in records review, medical decision making, counseling pertinent to today's visit, and charting.   I am glad she is doing better.  No change in treatment.  Continue Wellbutrin 75 mg, 1 q am.  Continue Klonopin 0.5 mg 1/2-1 twice daily as needed. Continue Prozac 30 mg every morning with then increased to 40 mg daily 1 week prior to menses and then go back to 30 mg after her menses begins.  Continue Topamax 50 mg, nightly per neurology. Return in  3 months.  Melony Overly, PA-C

## 2022-10-08 ENCOUNTER — Ambulatory Visit (INDEPENDENT_AMBULATORY_CARE_PROVIDER_SITE_OTHER): Payer: BC Managed Care – PPO

## 2022-10-08 DIAGNOSIS — J309 Allergic rhinitis, unspecified: Secondary | ICD-10-CM

## 2022-10-09 DIAGNOSIS — F4320 Adjustment disorder, unspecified: Secondary | ICD-10-CM | POA: Diagnosis not present

## 2022-10-15 ENCOUNTER — Other Ambulatory Visit: Payer: Self-pay | Admitting: Physician Assistant

## 2022-10-15 DIAGNOSIS — Z713 Dietary counseling and surveillance: Secondary | ICD-10-CM | POA: Diagnosis not present

## 2022-10-16 DIAGNOSIS — F4320 Adjustment disorder, unspecified: Secondary | ICD-10-CM | POA: Diagnosis not present

## 2022-10-23 DIAGNOSIS — F4320 Adjustment disorder, unspecified: Secondary | ICD-10-CM | POA: Diagnosis not present

## 2022-10-25 ENCOUNTER — Ambulatory Visit (INDEPENDENT_AMBULATORY_CARE_PROVIDER_SITE_OTHER): Payer: BC Managed Care – PPO

## 2022-10-25 DIAGNOSIS — J309 Allergic rhinitis, unspecified: Secondary | ICD-10-CM | POA: Diagnosis not present

## 2022-10-30 DIAGNOSIS — F4320 Adjustment disorder, unspecified: Secondary | ICD-10-CM | POA: Diagnosis not present

## 2022-10-31 ENCOUNTER — Other Ambulatory Visit: Payer: Self-pay | Admitting: Allergy and Immunology

## 2022-11-05 ENCOUNTER — Other Ambulatory Visit: Payer: Self-pay | Admitting: Allergy and Immunology

## 2022-11-05 DIAGNOSIS — Z713 Dietary counseling and surveillance: Secondary | ICD-10-CM | POA: Diagnosis not present

## 2022-11-06 DIAGNOSIS — F4320 Adjustment disorder, unspecified: Secondary | ICD-10-CM | POA: Diagnosis not present

## 2022-11-08 ENCOUNTER — Ambulatory Visit (INDEPENDENT_AMBULATORY_CARE_PROVIDER_SITE_OTHER): Payer: BC Managed Care – PPO

## 2022-11-08 DIAGNOSIS — J309 Allergic rhinitis, unspecified: Secondary | ICD-10-CM

## 2022-11-12 ENCOUNTER — Ambulatory Visit: Payer: BC Managed Care – PPO | Admitting: Allergy and Immunology

## 2022-11-12 ENCOUNTER — Other Ambulatory Visit: Payer: Self-pay

## 2022-11-12 ENCOUNTER — Encounter: Payer: Self-pay | Admitting: Allergy and Immunology

## 2022-11-12 VITALS — BP 124/66 | HR 66 | Temp 97.7°F | Resp 16 | Ht 69.0 in | Wt 235.4 lb

## 2022-11-12 DIAGNOSIS — J301 Allergic rhinitis due to pollen: Secondary | ICD-10-CM

## 2022-11-12 DIAGNOSIS — T781XXD Other adverse food reactions, not elsewhere classified, subsequent encounter: Secondary | ICD-10-CM

## 2022-11-12 DIAGNOSIS — J4540 Moderate persistent asthma, uncomplicated: Secondary | ICD-10-CM

## 2022-11-12 DIAGNOSIS — J3089 Other allergic rhinitis: Secondary | ICD-10-CM | POA: Diagnosis not present

## 2022-11-12 DIAGNOSIS — K219 Gastro-esophageal reflux disease without esophagitis: Secondary | ICD-10-CM

## 2022-11-12 MED ORDER — EPINEPHRINE 0.3 MG/0.3ML IJ SOAJ: 0.3000 mg | INTRAMUSCULAR | 1 refills | Status: DC | PRN

## 2022-11-12 MED ORDER — FLUTICASONE PROPIONATE 50 MCG/ACT NA SUSP: NASAL | 5 refills | Status: DC

## 2022-11-12 MED ORDER — OMEPRAZOLE 20 MG PO CPDR: 20.0000 mg | DELAYED_RELEASE_CAPSULE | Freq: Every morning | ORAL | 1 refills | Status: DC

## 2022-11-12 MED ORDER — FEXOFENADINE HCL 180 MG PO TABS: 180.0000 mg | ORAL_TABLET | Freq: Every day | ORAL | 0 refills | Status: DC | PRN

## 2022-11-12 MED ORDER — ALBUTEROL SULFATE HFA 108 (90 BASE) MCG/ACT IN AERS: INHALATION_SPRAY | RESPIRATORY_TRACT | 1 refills | Status: DC

## 2022-11-12 MED ORDER — MONTELUKAST SODIUM 10 MG PO TABS: 10.0000 mg | ORAL_TABLET | Freq: Every evening | ORAL | 1 refills | Status: DC

## 2022-11-12 MED ORDER — BUDESONIDE-FORMOTEROL FUMARATE 160-4.5 MCG/ACT IN AERO: INHALATION_SPRAY | RESPIRATORY_TRACT | 2 refills | Status: DC

## 2022-11-12 NOTE — Progress Notes (Unsigned)
[place] [facility] [place] [facility] [place] [facility] [place] [facility] [place]   Follow-up Note  Referring Provider: Jarrett Soho, PA-C Primary Provider: Jarrett Soho, PA-C Date of Office Visit: 11/12/2022  Subjective:   Sherry Adams (DOB: 03/12/1971) is a 52 y.o. female who [facility] in re-evaluation of the following:  HPI: Sherry Adams returns to this clinic in evaluation of asthma, allergic rhinoconjunctivitis, oral allergy syndrome, LPR.  I last saw her in this clinic 14 May 2022.  Her immunotherapy is going well though she does develop large local reactions.  She does take an Allegra every morning.  Her spring has really been very good and she has had very little problems with either her upper airway or her eyes or her asthma and she rarely uses a short acting bronchodilator while she also continues to use Symbicort mostly once a day and Flonase mostly once a day montelukast once a day.  She has not required a systemic steroid or an antibiotic for any type of airway issue.  Her reflux is under very good control while using omeprazole and she no longer uses any famotidine.    Allergies as of 11/12/2022       Reactions   Cetirizine Photosensitivity, Other (See Comments)   Dog Epithelium (canis Lupus Familiaris) Itching   Asthma?   Ibuprofen Other (See Comments)   "bronchospams"   Nsaids Nausea Only        Medication List        Accurate as of November 12, 2022 11:16 AM. If you have any questions, ask your nurse or doctor.          albuterol 1.25 MG/3ML nebulizer solution Commonly known as: ACCUNEB Take 3 mLs (1.25 mg total) by nebulization every 6 (six) hours as needed for wheezing.   albuterol 108 (90 Base) MCG/ACT inhaler Commonly known as: VENTOLIN HFA INHALE 1 PUFF INTO THE LUNGS EVERY 6 HOURS AS NEEDED FOR WHEEZING OR SHORTNESS OF BREATH.   ALLERGY MEDICATION PO Take by mouth.   budesonide-formoterol 160-4.5 MCG/ACT  inhaler Commonly known as: SYMBICORT Inhale 2 puffs into the lungs in the morning and at bedtime. TAKE 2 PUFFS BY MOUTH TWICE A DAY   buPROPion 75 MG tablet Commonly known as: WELLBUTRIN Take 1 tablet (75 mg total) by mouth in the morning.   clonazePAM 1 MG tablet Commonly known as: KLONOPIN Take 0.5-1 tablets (0.5-1 mg total) by mouth 2 (two) times daily as needed. 1/2 - 1 tab   EPINEPHrine 0.3 mg/0.3 mL Soaj injection Commonly known as: EPI-PEN Inject 0.3 mg into the muscle as needed for anaphylaxis.   famotidine 40 MG tablet Commonly known as: PEPCID TAKE 1 TABLET BY MOUTH EVERYDAY AT BEDTIME   fexofenadine 180 MG tablet Commonly known as: ALLEGRA Take 1 tablet (180 mg total) by mouth daily as needed for allergies or rhinitis (Can take an extra dose during flare ups.).   FLUoxetine 10 MG capsule Commonly known as: PROZAC TAKE 1 CAPSULE BY MOUTH EVERY DAY ALONG WITH 20MG  CAPSULE TO =30MG  DAILY. INCREASE TO 2 CAP DAILY THE WEEK BEFORE MENSES. THEN DECREASE BACK TO 1 PILL AFTER MENSES.   FLUoxetine 20 MG capsule Commonly known as: PROZAC TAKE 1 CAPSULE EVERY DAY WITH 10MG  TO EQUAL 30MG    fluticasone 50 MCG/ACT nasal spray Commonly known as: FLONASE Place 1 spray into both nostrils daily.   magnesium (amino acid chelate) 133 MG tablet Take 1 tablet by mouth 2 (two) times daily.  Magnesium 500 MG Caps Take 1 capsule by mouth daily.   montelukast 10 MG tablet Commonly known as: SINGULAIR Take 1 tablet (10 mg total) by mouth at bedtime.   omeprazole 20 MG capsule Commonly known as: PRILOSEC Take 1 capsule (20 mg total) by mouth in the morning. Qod sometimes.   rizatriptan 10 MG tablet Commonly known as: Maxalt Take 1 tablet (10 mg total) by mouth as needed for migraine. May repeat in 2 hours if needed   rosuvastatin 20 MG tablet Commonly known as: CRESTOR Take 20 mg by mouth daily. Take 1 tablet every other day   topiramate 50 MG tablet Commonly known as:  Topamax Take 1.5 tablets (75 mg total) by mouth at bedtime.   Vitamin D 10 MCG/ML Liqd Take 500 Units by mouth daily at 12 noon.        Past Medical History:  Diagnosis Date  . Asthma   . Endometriosis   . Frequent headaches   . GERD (gastroesophageal reflux disease)   . Mixed hyperlipidemia   . Panic attacks     Past Surgical History:  Procedure Laterality Date  . LAPAROSCOPY     for endometriosis  . WISDOM TOOTH EXTRACTION      Review of systems negative except as noted in HPI / PMHx or noted below:  ROS   Objective:   Vitals:   11/12/22 1044  BP: 124/66  Pulse: 66  Resp: 16  Temp: 97.7 F (36.5 C)  SpO2: 95%   Height: 5\' 9"  (175.3 cm)  Weight: 235 lb 6.4 oz (106.8 kg)   Physical Exam  Diagnostics:    Spirometry was performed and demonstrated an FEV1 of 3.12 at 98 % of predicted.  Assessment and Plan:   1. Asthma, moderate persistent, well-controlled   2. Perennial allergic rhinitis   3. Seasonal allergic rhinitis due to pollen   4. Pollen-food allergy, subsequent encounter   5. LPRD (laryngopharyngeal reflux disease)     1.  Allergen avoidance measures -dust mite, cat, dog, pollens, cockroach    2.  Treat and prevent inflammation:  A. Symbicort 160 - 2 inhalations 1-2 times per day   B. Flonase - 1-2 sprays each nostril 1 time per day C. Montelukast 10 mg - 1 tablet 1 time per day D. Immunotherapy   3. Treat LPR:  A. Omeprazole 40 mg - 1 tab in AM B. Replace throat clearing with swallowing/drinking maneuver  4. If needed:  A. Fexofenadine 180 - 1 tablet 1 time per day B. SYMBICORT 160 - 2 inhalations every 6 hours C. Auvi-Q 0.3 / Epi-Pen, benadryl, MD/ER evaluation for allergic reaction  5. Prior to immunotherapy use a fexofenadine 180 + famotidine 40   6. Return to clinic in 6 months or earlier if problem  7. Obtain fall flu vaccine  Sherry Adams is doing pretty well on her current plan which includes immunotherapy and she will  remain on this form of treatment at this point.  She is developing some large local reactions and we will have her use a combination of an H1 and H2 receptor blocker within an hour or 2 of her having in her injection.  As well, we will change around her medical plan so that she can now use an anti-inflammatory inhalation approach to any increased asthma activity that develops on her current plan.  Assuming she does well with the plan noted above I will see her back in this clinic in 6 months or earlier if there  is a problem.  Laurette Schimke, MD Allergy / Immunology [facility]

## 2022-11-12 NOTE — Patient Instructions (Addendum)
  1.  Allergen avoidance measures -dust mite, cat, dog, pollens, cockroach    2.  Treat and prevent inflammation:  A. Symbicort 160 - 2 inhalations 1-2 times per day   B. Flonase - 1-2 sprays each nostril 1 time per day C. Montelukast 10 mg - 1 tablet 1 time per day D. Immunotherapy   3. Treat LPR:  A. Omeprazole 40 mg - 1 tab in AM B. Replace throat clearing with swallowing/drinking maneuver  4. If needed:  A. Fexofenadine 180 - 1 tablet 1 time per day B. SYMBICORT 160 - 2 inhalations every 6 hours C. Auvi-Q 0.3 / Epi-Pen, benadryl, MD/ER evaluation for allergic reaction  5. Prior to immunotherapy use a fexofenadine 180 + famotidine 40   6. Return to clinic in 6 months or earlier if problem  7. Obtain fall flu vaccine

## 2022-11-13 ENCOUNTER — Encounter: Payer: Self-pay | Admitting: Allergy and Immunology

## 2022-11-13 DIAGNOSIS — F4320 Adjustment disorder, unspecified: Secondary | ICD-10-CM | POA: Diagnosis not present

## 2022-11-19 DIAGNOSIS — Z713 Dietary counseling and surveillance: Secondary | ICD-10-CM | POA: Diagnosis not present

## 2022-11-20 DIAGNOSIS — F4320 Adjustment disorder, unspecified: Secondary | ICD-10-CM | POA: Diagnosis not present

## 2022-11-22 ENCOUNTER — Ambulatory Visit (INDEPENDENT_AMBULATORY_CARE_PROVIDER_SITE_OTHER): Payer: BC Managed Care – PPO | Admitting: *Deleted

## 2022-11-22 DIAGNOSIS — J309 Allergic rhinitis, unspecified: Secondary | ICD-10-CM

## 2022-11-26 DIAGNOSIS — Z713 Dietary counseling and surveillance: Secondary | ICD-10-CM | POA: Diagnosis not present

## 2022-11-27 DIAGNOSIS — F4320 Adjustment disorder, unspecified: Secondary | ICD-10-CM | POA: Diagnosis not present

## 2022-12-03 DIAGNOSIS — Z713 Dietary counseling and surveillance: Secondary | ICD-10-CM | POA: Diagnosis not present

## 2022-12-04 ENCOUNTER — Encounter: Payer: Self-pay | Admitting: Neurology

## 2022-12-04 ENCOUNTER — Ambulatory Visit (INDEPENDENT_AMBULATORY_CARE_PROVIDER_SITE_OTHER): Payer: BC Managed Care – PPO | Admitting: Neurology

## 2022-12-04 VITALS — BP 115/80 | HR 73 | Ht 69.5 in | Wt 236.0 lb

## 2022-12-04 DIAGNOSIS — F4320 Adjustment disorder, unspecified: Secondary | ICD-10-CM | POA: Diagnosis not present

## 2022-12-04 DIAGNOSIS — G43709 Chronic migraine without aura, not intractable, without status migrainosus: Secondary | ICD-10-CM

## 2022-12-04 MED ORDER — TOPIRAMATE ER 50 MG PO CAP24: 50.0000 mg | ORAL_CAPSULE | Freq: Every day | ORAL | 11 refills | Status: DC

## 2022-12-04 MED ORDER — RIZATRIPTAN BENZOATE 10 MG PO TABS: 10.0000 mg | ORAL_TABLET | ORAL | 6 refills | Status: DC | PRN

## 2022-12-04 NOTE — Patient Instructions (Signed)
Switch to Topamax ER 50 mg at bedtime for headache prevention, continue the Maxalt as needed.
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
## 2022-12-06 ENCOUNTER — Ambulatory Visit (INDEPENDENT_AMBULATORY_CARE_PROVIDER_SITE_OTHER): Payer: BC Managed Care – PPO | Admitting: *Deleted

## 2022-12-06 DIAGNOSIS — J309 Allergic rhinitis, unspecified: Secondary | ICD-10-CM | POA: Diagnosis not present

## 2022-12-10 DIAGNOSIS — Z713 Dietary counseling and surveillance: Secondary | ICD-10-CM | POA: Diagnosis not present

## 2022-12-11 DIAGNOSIS — F4320 Adjustment disorder, unspecified: Secondary | ICD-10-CM | POA: Diagnosis not present

## 2022-12-13 ENCOUNTER — Ambulatory Visit (INDEPENDENT_AMBULATORY_CARE_PROVIDER_SITE_OTHER): Payer: Self-pay

## 2022-12-13 DIAGNOSIS — J309 Allergic rhinitis, unspecified: Secondary | ICD-10-CM

## 2022-12-20 ENCOUNTER — Ambulatory Visit (INDEPENDENT_AMBULATORY_CARE_PROVIDER_SITE_OTHER): Payer: BC Managed Care – PPO

## 2022-12-20 DIAGNOSIS — J309 Allergic rhinitis, unspecified: Secondary | ICD-10-CM

## 2022-12-25 DIAGNOSIS — Z713 Dietary counseling and surveillance: Secondary | ICD-10-CM | POA: Diagnosis not present

## 2022-12-31 ENCOUNTER — Ambulatory Visit (INDEPENDENT_AMBULATORY_CARE_PROVIDER_SITE_OTHER): Payer: BC Managed Care – PPO | Admitting: *Deleted

## 2022-12-31 DIAGNOSIS — J309 Allergic rhinitis, unspecified: Secondary | ICD-10-CM | POA: Diagnosis not present

## 2022-12-31 DIAGNOSIS — Z713 Dietary counseling and surveillance: Secondary | ICD-10-CM | POA: Diagnosis not present

## 2023-01-02 ENCOUNTER — Encounter: Payer: Self-pay | Admitting: Physician Assistant

## 2023-01-02 ENCOUNTER — Ambulatory Visit: Payer: BC Managed Care – PPO | Admitting: Physician Assistant

## 2023-01-02 DIAGNOSIS — F3342 Major depressive disorder, recurrent, in full remission: Secondary | ICD-10-CM

## 2023-01-02 DIAGNOSIS — F411 Generalized anxiety disorder: Secondary | ICD-10-CM

## 2023-01-02 DIAGNOSIS — G43709 Chronic migraine without aura, not intractable, without status migrainosus: Secondary | ICD-10-CM

## 2023-01-02 DIAGNOSIS — F4310 Post-traumatic stress disorder, unspecified: Secondary | ICD-10-CM

## 2023-01-02 MED ORDER — CLONAZEPAM 1 MG PO TABS: 0.5000 mg | ORAL_TABLET | Freq: Two times a day (BID) | ORAL | 0 refills | Status: DC | PRN

## 2023-01-02 NOTE — Progress Notes (Signed)
[facility]  Patient ID: Sherry Adams,  MRN: [id number]  PCP: Jarrett Soho, PA-C  Date of Evaluation: 01/02/2023 Time spent: 21 minutes  Chief Complaint:  Chief Complaint   Anxiety; Depression; Follow-up    HISTORY/CURRENT STATUS: HPI  for 3 month med check  She weaned off Wellbutrin. It was causing palpitations. States she doesn't feel like she needs it now.  The palpitations are no longer occurring since she went off the Wellbutrin.  She has only been off of it a few days but has had no side effects from the decrease.   Patient is able to enjoy things.  Energy and motivation are good.  Work is going well.   No extreme sadness, tearfulness, or feelings of hopelessness.  Sleeps well. ADLs and personal hygiene are normal.   Denies any changes in concentration, making decisions, or remembering things.  Her appetite has gotten better since she went off of the Wellbutrin.  Denies suicidal or homicidal thoughts.  She does take the Klonopin on rare occasions.  The last time she had it filled was last winter.  She does not have panic attacks but more so feels a sense of dread or being overwhelmed when she takes the Klonopin.  Thankfully she knows a lot of other tools to use that are helpful when she gets anxious so that she does not need to take the Klonopin.  Recently her daughter's mental health declined and even through that acute situation she handled it well and did not need the Klonopin.  Patient denies increased energy with decreased need for sleep, increased talkativeness, racing thoughts, impulsivity or risky behaviors, increased spending, increased libido, grandiosity, increased irritability or anger, paranoia, or hallucinations.  Denies dizziness, syncope, seizures, numbness, tingling, tremor, tics, unsteady gait, slurred speech, confusion. Denies muscle or joint pain, stiffness, or dystonia.  Individual Medical History/ Review of Systems: Changes? :No     Past  medications for mental health diagnoses include: Zoloft, Effexor, Prozac, Klonopin, Wellbutrin worked well for a while but caused palpitations.  Allergies: Cetirizine, Dog epithelium (canis lupus familiaris), Ibuprofen, and Nsaids  Current Medications:  Current Outpatient Medications:    albuterol (ACCUNEB) 1.25 MG/3ML nebulizer solution, Take 3 mLs (1.25 mg total) by nebulization every 6 (six) hours as needed for wheezing., Disp: 100 mL, Rfl: 1   albuterol (VENTOLIN HFA) 108 (90 Base) MCG/ACT inhaler, INHALE 1 PUFF INTO THE LUNGS EVERY 6 HOURS AS NEEDED FOR WHEEZING OR SHORTNESS OF BREATH., Disp: 18 each, Rfl: 1   budesonide-formoterol (SYMBICORT) 160-4.5 MCG/ACT inhaler, 2 inhalations 1-2 times per day., Disp: 10.2 each, Rfl: 2   Cholecalciferol (VITAMIN D) 10 MCG/ML LIQD, Take 500 Units by mouth daily at 12 noon., Disp: , Rfl:    diphenhydrAMINE HCl (ALLERGY MEDICATION PO), Take by mouth., Disp: , Rfl:    famotidine (PEPCID) 40 MG tablet, TAKE 1 TABLET BY MOUTH EVERYDAY AT BEDTIME, Disp: 30 tablet, Rfl: 0   fexofenadine (ALLEGRA) 180 MG tablet, Take 1 tablet (180 mg total) by mouth daily as needed for allergies or rhinitis (Can take an extra dose during flare ups.)., Disp: 180 tablet, Rfl: 0   FLUoxetine (PROZAC) 10 MG capsule, TAKE 1 CAPSULE BY MOUTH EVERY DAY ALONG WITH 20MG  CAPSULE TO =30MG  DAILY. INCREASE TO 2 CAP DAILY THE WEEK BEFORE MENSES. THEN DECREASE BACK TO 1 PILL AFTER MENSES., Disp: 180 capsule, Rfl: 3   FLUoxetine (PROZAC) 20 MG capsule, TAKE 1 CAPSULE EVERY DAY WITH 10MG  TO EQUAL 30MG , Disp: 90 capsule, Rfl:  3   fluticasone (FLONASE) 50 MCG/ACT nasal spray, 1-2 sprays each nostril 1 (ONE) Time per day., Disp: 16 g, Rfl: 5   Magnesium 500 MG CAPS, Take 1 capsule by mouth daily., Disp: , Rfl:    montelukast (SINGULAIR) 10 MG tablet, Take 1 tablet (10 mg total) by mouth at bedtime., Disp: 90 tablet, Rfl: 1   omeprazole (PRILOSEC) 20 MG capsule, Take 1 capsule (20 mg total) by mouth  in the morning., Disp: 90 capsule, Rfl: 1   rizatriptan (MAXALT) 10 MG tablet, Take 1 tablet (10 mg total) by mouth as needed for migraine. May repeat in 2 hours if needed, Disp: 10 tablet, Rfl: 6   rosuvastatin (CRESTOR) 20 MG tablet, Take 20 mg by mouth daily. Take 1 tablet every other day, Disp: , Rfl:    Specialty Vitamins Products (MAGNESIUM, AMINO ACID CHELATE,) 133 MG tablet, Take 1 tablet by mouth 2 (two) times daily., Disp: , Rfl:    Topiramate ER (TROKENDI XR) 50 MG CP24, Take 1 capsule (50 mg total) by mouth at bedtime., Disp: 30 capsule, Rfl: 11   clonazePAM (KLONOPIN) 1 MG tablet, Take 0.5-1 tablets (0.5-1 mg total) by mouth 2 (two) times daily as needed., Disp: 30 tablet, Rfl: 0   EPINEPHrine 0.3 mg/0.3 mL IJ SOAJ injection, Inject 0.3 mg into the muscle as needed for anaphylaxis. (Patient not taking: Reported on 01/02/2023), Disp: 2 each, Rfl: 1 Medication Side Effects: none  Family Medical/ Social History: Changes? none  MENTAL HEALTH EXAM:  Last menstrual period 12/03/2022.There is no height or weight on file to calculate BMI.  General Appearance: Casual, Neat and Well Groomed  Eye Contact:  Good  Speech:  Clear and Coherent and Normal Rate  Volume:  Normal  Mood:  Euthymic  Affect:  Congruent  Thought Process:  Goal Directed and Descriptions of Associations: Circumstantial  Orientation:  Full (Time, Place, and Person)  Thought Content: Logical   Suicidal Thoughts:  No  Homicidal Thoughts:  No  Memory:  WNL  Judgement:  Good  Insight:  Good  Psychomotor Activity:  Normal  Concentration:  Concentration: Good and Attention Span: Good  Recall:  Good  Fund of Knowledge: Good  Language: Good  Assets:  Communication Skills Desire for Improvement Financial Resources/Insurance Housing Physical Health Resilience Transportation Vocational/Educational  ADL's:  Intact  Cognition: WNL  Prognosis:  Good   DIAGNOSES:    ICD-10-CM   1. Generalized anxiety disorder   F41.1     2. Recurrent major depressive disorder, in full remission (HCC)  F33.42     3. PTSD (post-traumatic stress disorder)  F43.10     4. Chronic migraine w/o aura w/o status migrainosus, not intractable  G43.709       Receiving Psychotherapy: No      RECOMMENDATIONS:  PDMP reviewed.  Klonopin filled 03/26/2022. I provided 21 minutes of face to face time during this encounter, including time spent before and after the visit in records review, medical decision making, counseling pertinent to today's visit, and charting.   She has already stopped the Wellbutrin.  Continue Klonopin 0.5 mg 1/2-1 twice daily as needed. Continue Prozac 30 mg every morning with then increased to 40 mg daily 1 week prior to menses and then go back to 30 mg after her menses begins.  Continue Topamax 50 mg, nightly per neurology. Return in 6 weeks.   Melony Overly, PA-C

## 2023-01-07 ENCOUNTER — Ambulatory Visit (INDEPENDENT_AMBULATORY_CARE_PROVIDER_SITE_OTHER): Payer: Self-pay | Admitting: *Deleted

## 2023-01-07 DIAGNOSIS — J309 Allergic rhinitis, unspecified: Secondary | ICD-10-CM | POA: Diagnosis not present

## 2023-01-08 DIAGNOSIS — Z713 Dietary counseling and surveillance: Secondary | ICD-10-CM | POA: Diagnosis not present

## 2023-01-14 DIAGNOSIS — Z713 Dietary counseling and surveillance: Secondary | ICD-10-CM | POA: Diagnosis not present

## 2023-01-15 ENCOUNTER — Ambulatory Visit (INDEPENDENT_AMBULATORY_CARE_PROVIDER_SITE_OTHER): Payer: BC Managed Care – PPO | Admitting: *Deleted

## 2023-01-15 DIAGNOSIS — J309 Allergic rhinitis, unspecified: Secondary | ICD-10-CM | POA: Diagnosis not present

## 2023-01-22 ENCOUNTER — Ambulatory Visit (INDEPENDENT_AMBULATORY_CARE_PROVIDER_SITE_OTHER): Payer: BC Managed Care – PPO | Admitting: *Deleted

## 2023-01-22 DIAGNOSIS — J309 Allergic rhinitis, unspecified: Secondary | ICD-10-CM

## 2023-01-28 DIAGNOSIS — Z713 Dietary counseling and surveillance: Secondary | ICD-10-CM | POA: Diagnosis not present

## 2023-01-30 ENCOUNTER — Ambulatory Visit (INDEPENDENT_AMBULATORY_CARE_PROVIDER_SITE_OTHER): Payer: BC Managed Care – PPO

## 2023-01-30 DIAGNOSIS — J309 Allergic rhinitis, unspecified: Secondary | ICD-10-CM | POA: Diagnosis not present

## 2023-02-04 ENCOUNTER — Other Ambulatory Visit: Payer: Self-pay | Admitting: Allergy and Immunology

## 2023-02-04 DIAGNOSIS — K219 Gastro-esophageal reflux disease without esophagitis: Secondary | ICD-10-CM | POA: Diagnosis not present

## 2023-02-04 DIAGNOSIS — E782 Mixed hyperlipidemia: Secondary | ICD-10-CM | POA: Diagnosis not present

## 2023-02-04 DIAGNOSIS — Z0000 Encounter for general adult medical examination without abnormal findings: Secondary | ICD-10-CM | POA: Diagnosis not present

## 2023-02-04 DIAGNOSIS — J45998 Other asthma: Secondary | ICD-10-CM | POA: Diagnosis not present

## 2023-02-04 DIAGNOSIS — Z713 Dietary counseling and surveillance: Secondary | ICD-10-CM | POA: Diagnosis not present

## 2023-02-04 MED ORDER — FEXOFENADINE HCL 180 MG PO TABS: 180.0000 mg | ORAL_TABLET | Freq: Every day | ORAL | 1 refills | Status: DC

## 2023-02-04 NOTE — Addendum Note (Signed)
Addended by: Rolland Bimler D on: 02/04/2023 11:21 AM   Modules accepted: Orders

## 2023-02-07 ENCOUNTER — Ambulatory Visit (INDEPENDENT_AMBULATORY_CARE_PROVIDER_SITE_OTHER): Payer: BC Managed Care – PPO | Admitting: *Deleted

## 2023-02-07 DIAGNOSIS — J309 Allergic rhinitis, unspecified: Secondary | ICD-10-CM | POA: Diagnosis not present

## 2023-02-11 DIAGNOSIS — Z713 Dietary counseling and surveillance: Secondary | ICD-10-CM | POA: Diagnosis not present

## 2023-02-13 ENCOUNTER — Ambulatory Visit: Payer: BC Managed Care – PPO | Admitting: Physician Assistant

## 2023-02-18 DIAGNOSIS — Z713 Dietary counseling and surveillance: Secondary | ICD-10-CM | POA: Diagnosis not present

## 2023-02-19 ENCOUNTER — Ambulatory Visit (INDEPENDENT_AMBULATORY_CARE_PROVIDER_SITE_OTHER): Payer: BC Managed Care – PPO

## 2023-02-19 DIAGNOSIS — E782 Mixed hyperlipidemia: Secondary | ICD-10-CM | POA: Diagnosis not present

## 2023-02-19 DIAGNOSIS — J309 Allergic rhinitis, unspecified: Secondary | ICD-10-CM

## 2023-02-25 DIAGNOSIS — Z713 Dietary counseling and surveillance: Secondary | ICD-10-CM | POA: Diagnosis not present

## 2023-03-14 ENCOUNTER — Ambulatory Visit (INDEPENDENT_AMBULATORY_CARE_PROVIDER_SITE_OTHER): Payer: BC Managed Care – PPO

## 2023-03-14 DIAGNOSIS — J309 Allergic rhinitis, unspecified: Secondary | ICD-10-CM | POA: Diagnosis not present

## 2023-03-18 ENCOUNTER — Other Ambulatory Visit: Payer: Self-pay | Admitting: Physician Assistant

## 2023-03-25 ENCOUNTER — Ambulatory Visit (INDEPENDENT_AMBULATORY_CARE_PROVIDER_SITE_OTHER): Payer: BC Managed Care – PPO | Admitting: *Deleted

## 2023-03-25 DIAGNOSIS — J309 Allergic rhinitis, unspecified: Secondary | ICD-10-CM

## 2023-03-27 DIAGNOSIS — J3089 Other allergic rhinitis: Secondary | ICD-10-CM | POA: Diagnosis not present

## 2023-03-27 NOTE — Progress Notes (Addendum)
 VIALS EXP 03-26-24

## 2023-03-30 ENCOUNTER — Other Ambulatory Visit: Payer: Self-pay | Admitting: Allergy and Immunology

## 2023-04-02 ENCOUNTER — Encounter: Payer: Self-pay | Admitting: Physician Assistant

## 2023-04-02 ENCOUNTER — Ambulatory Visit: Payer: BC Managed Care – PPO | Admitting: Physician Assistant

## 2023-04-02 DIAGNOSIS — F4310 Post-traumatic stress disorder, unspecified: Secondary | ICD-10-CM | POA: Diagnosis not present

## 2023-04-02 DIAGNOSIS — F331 Major depressive disorder, recurrent, moderate: Secondary | ICD-10-CM

## 2023-04-02 DIAGNOSIS — F411 Generalized anxiety disorder: Secondary | ICD-10-CM

## 2023-04-02 MED ORDER — CLONAZEPAM 1 MG PO TABS: 0.5000 mg | ORAL_TABLET | Freq: Two times a day (BID) | ORAL | 0 refills | Status: DC | PRN

## 2023-04-02 MED ORDER — BUPROPION HCL ER (XL) 150 MG PO TB24: 150.0000 mg | ORAL_TABLET | Freq: Every day | ORAL | 1 refills | Status: DC

## 2023-04-02 NOTE — Progress Notes (Signed)
 [facility]  Patient ID: Sherry Adams,  MRN: [id number]  PCP: Katina Pfeiffer, PA-C  Date of Evaluation: 04/02/2023 Time spent:20 minutes  Chief Complaint:  Chief Complaint   Depression; Follow-up    HISTORY/CURRENT STATUS: HPI  for 3 month med check  Feels very depressed. Anhedonia. Very tired, since going off Wellbutrin . Sleep is fitful, doesn't feel rested when she gets up.  Work is going well.   No hopelessness.  ADLs and personal hygiene are normal.   Denies any changes in concentration, making decisions, or remembering things.  Appetite has not changed.  Weight is stable.  Anxiety is not well-controlled.  Feels panicky at times. Klonopin  helps but not as much as it had been.  Denies suicidal or homicidal thoughts.  Patient denies increased energy with decreased need for sleep, increased talkativeness, racing thoughts, impulsivity or risky behaviors, increased spending, increased libido, grandiosity, increased irritability or anger, paranoia, or hallucinations.  Denies dizziness, syncope, seizures, numbness, tingling, tremor, tics, unsteady gait, slurred speech, confusion. Denies muscle or joint pain, stiffness, or dystonia.  Individual Medical History/ Review of Systems: Changes? :No     Past medications for mental health diagnoses include: Zoloft, Effexor, Prozac , Klonopin , Wellbutrin  worked well for a while but caused palpitations.  Allergies: Cetirizine, Dog epithelium (canis lupus familiaris), Ibuprofen, and Nsaids  Current Medications:  Current Outpatient Medications:    albuterol  (ACCUNEB ) 1.25 MG/3ML nebulizer solution, Take 3 mLs (1.25 mg total) by nebulization every 6 (six) hours as needed for wheezing., Disp: 100 mL, Rfl: 1   albuterol  (VENTOLIN  HFA) 108 (90 Base) MCG/ACT inhaler, INHALE 1 PUFF INTO THE LUNGS EVERY 6 HOURS AS NEEDED FOR WHEEZING OR SHORTNESS OF BREATH., Disp: 18 each, Rfl: 1   buPROPion  (WELLBUTRIN  XL) 150 MG 24 hr tablet, Take 1  tablet (150 mg total) by mouth daily., Disp: 30 tablet, Rfl: 1   Cholecalciferol (VITAMIN D) 10 MCG/ML LIQD, Take 500 Units by mouth daily at 12 noon., Disp: , Rfl:    diphenhydrAMINE HCl (ALLERGY  MEDICATION PO), Take by mouth., Disp: , Rfl:    famotidine  (PEPCID ) 40 MG tablet, TAKE 1 TABLET BY MOUTH EVERYDAY AT BEDTIME, Disp: 30 tablet, Rfl: 0   fexofenadine  (ALLEGRA ) 180 MG tablet, Take 1 tablet (180 mg total) by mouth daily., Disp: 180 tablet, Rfl: 1   FLUoxetine  (PROZAC ) 10 MG capsule, TAKE 1 CAPSULE DAILY WITH THE 20 MG=30 MG DAILY., Disp: 30 capsule, Rfl: 0   FLUoxetine  (PROZAC ) 20 MG capsule, TAKE 1 CAPSULE EVERY DAY WITH 10MG  TO EQUAL 30MG , Disp: 90 capsule, Rfl: 3   fluticasone  (FLONASE ) 50 MCG/ACT nasal spray, 1-2 sprays each nostril 1 (ONE) Time per day., Disp: 16 g, Rfl: 5   Magnesium 500 MG CAPS, Take 1 capsule by mouth daily., Disp: , Rfl:    montelukast  (SINGULAIR ) 10 MG tablet, Take 1 tablet (10 mg total) by mouth at bedtime., Disp: 90 tablet, Rfl: 1   omeprazole  (PRILOSEC) 20 MG capsule, Take 1 capsule (20 mg total) by mouth in the morning., Disp: 90 capsule, Rfl: 1   rizatriptan  (MAXALT ) 10 MG tablet, Take 1 tablet (10 mg total) by mouth as needed for migraine. May repeat in 2 hours if needed, Disp: 10 tablet, Rfl: 6   rosuvastatin (CRESTOR) 20 MG tablet, Take 20 mg by mouth daily. Take 1 tablet every other day, Disp: , Rfl:    Topiramate  ER (TROKENDI  XR) 50 MG CP24, Take 1 capsule (50 mg total) by mouth at bedtime., Disp: 30 capsule, Rfl:  11   clonazePAM  (KLONOPIN ) 1 MG tablet, Take 0.5-1 tablets (0.5-1 mg total) by mouth 2 (two) times daily as needed., Disp: 30 tablet, Rfl: 0   EPINEPHrine  0.3 mg/0.3 mL IJ SOAJ injection, Inject 0.3 mg into the muscle as needed for anaphylaxis. (Patient not taking: Reported on 04/02/2023), Disp: 2 each, Rfl: 1   mometasone-formoterol  (DULERA) 200-5 MCG/ACT AERO, 2 inhalations 1-2 times per day., Disp: 13 g, Rfl: 4 Medication Side Effects:  none  Family Medical/ Social History: Changes? none  MENTAL HEALTH EXAM:  There were no vitals taken for this visit.There is no height or weight on file to calculate BMI.  General Appearance: Casual, Neat and Well Groomed  Eye Contact:  Good  Speech:  Clear and Coherent and Normal Rate  Volume:  Normal  Mood:   sad  Affect:  Congruent  Thought Process:  Goal Directed and Descriptions of Associations: Circumstantial  Orientation:  Full (Time, Place, and Person)  Thought Content: Logical   Suicidal Thoughts:  No  Homicidal Thoughts:  No  Memory:  WNL  Judgement:  Good  Insight:  Good  Psychomotor Activity:  Normal  Concentration:  Concentration: Good and Attention Span: Good  Recall:  Good  Fund of Knowledge: Good  Language: Good  Assets:  Communication Skills Desire for Improvement Financial Resources/Insurance Housing Physical Health Resilience Transportation Vocational/Educational  ADL's:  Intact  Cognition: WNL  Prognosis:  Good   DIAGNOSES:    ICD-10-CM   1. Major depressive disorder, recurrent episode, moderate (HCC)  F33.1     2. Generalized anxiety disorder  F41.1     3. PTSD (post-traumatic stress disorder)  F43.10       Receiving Psychotherapy: No      RECOMMENDATIONS:  PDMP reviewed.  Klonopin  filled 01/02/2023. I provided 20 minutes of face to face time during this encounter, including time spent before and after the visit in records review, medical decision making, counseling pertinent to today's visit, and charting.   Restart Wellbutrin  XL 150 mg, 1 every day.  increase Klonopin  to 1 mg 1/2-1 twice daily as needed. Continue Prozac  30 mg every morning with then increased to 40 mg daily 1 week prior to menses and then go back to 30 mg after her menses begins.  Continue Topamax  50 mg, nightly per neurology. Recommend counseling. Return in 6 weeks.   Verneita Cooks, PA-C

## 2023-04-04 ENCOUNTER — Ambulatory Visit (INDEPENDENT_AMBULATORY_CARE_PROVIDER_SITE_OTHER): Payer: Self-pay | Admitting: *Deleted

## 2023-04-04 DIAGNOSIS — J309 Allergic rhinitis, unspecified: Secondary | ICD-10-CM

## 2023-04-04 NOTE — Telephone Encounter (Signed)
 Per Provider:  She can use Dulera 200 to replace her Symbicort - same instructions

## 2023-04-17 ENCOUNTER — Ambulatory Visit (INDEPENDENT_AMBULATORY_CARE_PROVIDER_SITE_OTHER): Payer: BC Managed Care – PPO

## 2023-04-17 DIAGNOSIS — J309 Allergic rhinitis, unspecified: Secondary | ICD-10-CM

## 2023-04-18 ENCOUNTER — Other Ambulatory Visit: Payer: Self-pay | Admitting: Physician Assistant

## 2023-04-28 ENCOUNTER — Ambulatory Visit (INDEPENDENT_AMBULATORY_CARE_PROVIDER_SITE_OTHER): Payer: Self-pay | Admitting: *Deleted

## 2023-04-28 ENCOUNTER — Other Ambulatory Visit: Payer: Self-pay | Admitting: Physician Assistant

## 2023-04-28 ENCOUNTER — Other Ambulatory Visit (HOSPITAL_COMMUNITY): Payer: Self-pay | Admitting: Family Medicine

## 2023-04-28 DIAGNOSIS — J309 Allergic rhinitis, unspecified: Secondary | ICD-10-CM

## 2023-04-28 DIAGNOSIS — E782 Mixed hyperlipidemia: Secondary | ICD-10-CM

## 2023-05-05 ENCOUNTER — Ambulatory Visit (HOSPITAL_COMMUNITY)
Admission: RE | Admit: 2023-05-05 | Discharge: 2023-05-05 | Disposition: A | Discharge status: Home / Self Care | Payer: Self-pay | Source: Ambulatory Visit | Attending: Family Medicine | Admitting: Family Medicine

## 2023-05-05 DIAGNOSIS — E782 Mixed hyperlipidemia: Secondary | ICD-10-CM | POA: Insufficient documentation

## 2023-05-12 ENCOUNTER — Other Ambulatory Visit: Payer: Self-pay | Admitting: Physician Assistant

## 2023-05-13 ENCOUNTER — Ambulatory Visit: Payer: Self-pay | Admitting: *Deleted

## 2023-05-13 ENCOUNTER — Ambulatory Visit (INDEPENDENT_AMBULATORY_CARE_PROVIDER_SITE_OTHER): Payer: BC Managed Care – PPO | Admitting: Allergy and Immunology

## 2023-05-13 VITALS — BP 120/80 | HR 64 | Temp 98.0°F | Resp 18 | Ht 69.29 in | Wt 228.3 lb

## 2023-05-13 DIAGNOSIS — T781XXD Other adverse food reactions, not elsewhere classified, subsequent encounter: Secondary | ICD-10-CM

## 2023-05-13 DIAGNOSIS — J3089 Other allergic rhinitis: Secondary | ICD-10-CM

## 2023-05-13 DIAGNOSIS — J4540 Moderate persistent asthma, uncomplicated: Secondary | ICD-10-CM | POA: Diagnosis not present

## 2023-05-13 DIAGNOSIS — K219 Gastro-esophageal reflux disease without esophagitis: Secondary | ICD-10-CM

## 2023-05-13 DIAGNOSIS — J301 Allergic rhinitis due to pollen: Secondary | ICD-10-CM | POA: Diagnosis not present

## 2023-05-13 MED ORDER — OMEPRAZOLE 20 MG PO CPDR: 20.0000 mg | DELAYED_RELEASE_CAPSULE | Freq: Every morning | ORAL | 1 refills | Status: DC

## 2023-05-13 MED ORDER — MONTELUKAST SODIUM 10 MG PO TABS: 10.0000 mg | ORAL_TABLET | Freq: Every evening | ORAL | 1 refills | Status: DC

## 2023-05-13 MED ORDER — EPINEPHRINE 0.3 MG/0.3ML IJ SOAJ: 0.3000 mg | INTRAMUSCULAR | 1 refills | Status: DC | PRN

## 2023-05-13 MED ORDER — FEXOFENADINE HCL 180 MG PO TABS: 180.0000 mg | ORAL_TABLET | Freq: Every day | ORAL | 1 refills | Status: DC

## 2023-05-13 MED ORDER — ALBUTEROL SULFATE HFA 108 (90 BASE) MCG/ACT IN AERS: INHALATION_SPRAY | RESPIRATORY_TRACT | 1 refills | Status: DC

## 2023-05-13 MED ORDER — FLUTICASONE PROPIONATE 50 MCG/ACT NA SUSP: NASAL | 5 refills | Status: DC

## 2023-05-13 MED ORDER — FAMOTIDINE 40 MG PO TABS: 40.0000 mg | ORAL_TABLET | Freq: Every day | ORAL | 1 refills | Status: DC

## 2023-05-13 MED ORDER — BUDESONIDE-FORMOTEROL FUMARATE 160-4.5 MCG/ACT IN AERO: 2.0000 | INHALATION_SPRAY | Freq: Two times a day (BID) | RESPIRATORY_TRACT | 5 refills | Status: DC

## 2023-05-13 NOTE — Patient Instructions (Addendum)
  1.  Allergen avoidance measures -dust mite, cat, dog, pollens, cockroach    2.  Treat and prevent inflammation:  A. Symbicort 160 - 2 inhalations 1-2 times per day   B. Flonase - 1-2 sprays each nostril 1 time per day C. Montelukast 10 mg - 1 tablet 1 time per day D. Immunotherapy   3. Treat LPR:  A. Omeprazole 40 mg - 1 tab in AM B. Famotidine 40 mg - in PM  4. If needed:  A. Fexofenadine 180 - 1 tablet 1 time per day B. SYMBICORT 160 - 2 inhalations every 6 hours C. Epi-Pen, benadryl, MD/ER evaluation for allergic reaction  5. Return to clinic in 6 months or earlier if problem  6. Influenza = Tamiflu. Covid = Paxlovid

## 2023-05-13 NOTE — Progress Notes (Unsigned)
[place] [facility] [place] - [place]   Follow-up Note  Referring Provider: Jarrett Soho, PA-C Primary Provider: Jarrett Soho, PA-C Date of Office Visit: 05/13/2023  Subjective:   Sherry Adams (DOB: Dec 05, 1970) is a 53 y.o. female who [facility] in re-evaluation of the following:  HPI: [name] returns to this clinic in evaluation of asthma, allergic rhinoconjunctivitis, oral allergy syndrome, LPR.  I last saw him in this clinic 12 November 2022.  She is currently using immunotherapy on a weekly basis and this has resulted in rather significant improvement regarding her upper airway and lower airway issue as she goes through each season of the year.  She has not required a systemic steroid or an antibiotic for any type of airway issue.  She rarely uses a short acting bronchodilator and she can have cold air exposure and exercise without much problem.  She has been using her Symbicort once a day and been using her Flonase once a day in conjunction with montelukast.  Her reflux is under good control.  She is currently using omeprazole in the morning and Pepcid at night and she thinks this works great.  She has determined that she needs both these agents to control her reflux.  She has obtained this years flu vaccine.  Allergies as of 05/13/2023       Reactions   Cetirizine Photosensitivity, Other (See Comments)   Tranexamic Acid Cough   tranexamic acid   Dog Epithelium (canis Lupus Familiaris) Itching   Asthma?   Ibuprofen Other (See Comments)   "bronchospams"   Nsaids Nausea Only        Medication List    albuterol 1.25 MG/3ML nebulizer solution Commonly known as: ACCUNEB Take 3 mLs (1.25 mg total) by nebulization every 6 (six) hours as needed for wheezing.   albuterol 108 (90 Base) MCG/ACT inhaler Commonly known as: VENTOLIN HFA INHALE 1 PUFF INTO THE LUNGS EVERY 6 HOURS AS NEEDED FOR WHEEZING OR  SHORTNESS OF BREATH.   ALLERGY MEDICATION PO Take by mouth.   buPROPion 150 MG 24 hr tablet Commonly known as: WELLBUTRIN XL TAKE 1 TABLET BY MOUTH EVERY DAY   clonazePAM 1 MG tablet Commonly known as: KLONOPIN Take 0.5-1 tablets (0.5-1 mg total) by mouth 2 (two) times daily as needed.   EPINEPHrine 0.3 mg/0.3 mL Soaj injection Commonly known as: EPI-PEN Inject 0.3 mg into the muscle as needed for anaphylaxis.   famotidine 40 MG tablet Commonly known as: PEPCID TAKE 1 TABLET BY MOUTH EVERYDAY AT BEDTIME   fexofenadine 180 MG tablet Commonly known as: ALLEGRA Take 1 tablet (180 mg total) by mouth daily.   FLUoxetine 20 MG capsule Commonly known as: PROZAC TAKE 1 CAPSULE EVERY DAY WITH 10MG  TO EQUAL 30MG    FLUoxetine 10 MG capsule Commonly known as: PROZAC TAKE 1 CAPSULE DAILY WITH THE 20 MG=30 MG DAILY.   fluticasone 50 MCG/ACT nasal spray Commonly known as: FLONASE 1-2 sprays each nostril 1 (ONE) Time per day.   Magnesium 500 MG Caps Take 1 capsule by mouth daily.   montelukast 10 MG tablet Commonly known as: SINGULAIR Take 1 tablet (10 mg total) by mouth at bedtime.   omeprazole 20 MG capsule Commonly known as: PRILOSEC Take 1 capsule (20 mg total) by mouth in the morning.   rizatriptan 10 MG tablet Commonly known as: Maxalt Take 1 tablet (10 mg total) by mouth as needed for migraine. May repeat in 2  hours if needed   rosuvastatin 20 MG tablet Commonly known as: CRESTOR Take 20 mg by mouth daily. Take 1 tablet every other day   Topiramate ER 50 MG Cp24 Commonly known as: TROKENDI XR Take 1 capsule (50 mg total) by mouth at bedtime.   Vitamin D 10 MCG/ML Liqd Take 500 Units by mouth daily at 12 noon.    Past Medical History:  Diagnosis Date   Asthma    Endometriosis    Frequent headaches    GERD (gastroesophageal reflux disease)    Mixed hyperlipidemia    Panic attacks     Past Surgical History:  Procedure Laterality Date   LAPAROSCOPY      for endometriosis   WISDOM TOOTH EXTRACTION      Review of systems negative except as noted in HPI / PMHx or noted below:  Review of Systems  Constitutional: Negative.   HENT: Negative.    Eyes: Negative.   Respiratory: Negative.    Cardiovascular: Negative.   Gastrointestinal: Negative.   Genitourinary: Negative.   Musculoskeletal: Negative.   Skin: Negative.   Neurological: Negative.   Endo/Heme/Allergies: Negative.   Psychiatric/Behavioral: Negative.       Objective:   Vitals:   05/13/23 1129  BP: 120/80  Pulse: 64  Resp: 18  Temp: 98 F (36.7 C)  SpO2: 96%   Height: 5' 9.29" (176 cm)  Weight: 228 lb 4.8 oz (103.6 kg)   Physical Exam Constitutional:      Appearance: She is not diaphoretic.  HENT:     Head: Normocephalic.     Right Ear: Tympanic membrane, ear canal and external ear normal.     Left Ear: Tympanic membrane, ear canal and external ear normal.     Nose: Nose normal. No mucosal edema or rhinorrhea.     Mouth/Throat:     Pharynx: Uvula midline. No oropharyngeal exudate.  Eyes:     Conjunctiva/sclera: Conjunctivae normal.  Neck:     Thyroid: No thyromegaly.     Trachea: Trachea normal. No tracheal tenderness or tracheal deviation.  Cardiovascular:     Rate and Rhythm: Normal rate and regular rhythm.     Heart sounds: Normal heart sounds, S1 normal and S2 normal. No murmur heard. Pulmonary:     Effort: No respiratory distress.     Breath sounds: Normal breath sounds. No stridor. No wheezing or rales.  Lymphadenopathy:     Head:     Right side of head: No tonsillar adenopathy.     Left side of head: No tonsillar adenopathy.     Cervical: No cervical adenopathy.  Skin:    Findings: No erythema or rash.     Nails: There is no clubbing.  Neurological:     Mental Status: She is alert.     Diagnostics: Spirometry was performed and demonstrated an FEV1 of 3.17 at 100 % of predicted.  Assessment and Plan:   1. Asthma, moderate persistent,  well-controlled   2. Perennial allergic rhinitis   3. Seasonal allergic rhinitis due to pollen   4. Pollen-food allergy, subsequent encounter   5. LPRD (laryngopharyngeal reflux disease)    1.  Allergen avoidance measures -dust mite, cat, dog, pollens, cockroach    2.  Treat and prevent inflammation:  A. Symbicort 160 - 2 inhalations 1-2 times per day   B. Flonase - 1-2 sprays each nostril 1 time per day C. Montelukast 10 mg - 1 tablet 1 time per day D. Immunotherapy   3.  Treat LPR:  A. Omeprazole 40 mg - 1 tab in AM B. Famotidine 40 mg - in PM  4. If needed:  A. Fexofenadine 180 - 1 tablet 1 time per day B. SYMBICORT 160 - 2 inhalations every 6 hours C. Epi-Pen, benadryl, MD/ER evaluation for allergic reaction  5. Return to clinic in 6 months or earlier if problem  6. Influenza = Tamiflu. Covid = Paxlovid  [place] appears to be doing very well on her current plan of utilizing immunotherapy and anti-inflammatory agents for airway on a preventative basis and also addressing her LPR with a combination of a proton pump inhibitor and H2 receptor blocker.  She will continue to utilize this plan I will see her back in this clinic in 6 months or earlier if there is a problem.  Laurette Schimke, MD Allergy / Immunology [facility]

## 2023-05-14 ENCOUNTER — Encounter: Payer: Self-pay | Admitting: Allergy and Immunology

## 2023-05-14 ENCOUNTER — Encounter: Payer: Self-pay | Admitting: Physician Assistant

## 2023-05-14 ENCOUNTER — Ambulatory Visit (INDEPENDENT_AMBULATORY_CARE_PROVIDER_SITE_OTHER): Payer: BC Managed Care – PPO | Admitting: Physician Assistant

## 2023-05-14 DIAGNOSIS — F411 Generalized anxiety disorder: Secondary | ICD-10-CM

## 2023-05-14 DIAGNOSIS — F3281 Premenstrual dysphoric disorder: Secondary | ICD-10-CM

## 2023-05-14 DIAGNOSIS — F4310 Post-traumatic stress disorder, unspecified: Secondary | ICD-10-CM

## 2023-05-14 DIAGNOSIS — F3341 Major depressive disorder, recurrent, in partial remission: Secondary | ICD-10-CM | POA: Diagnosis not present

## 2023-05-14 MED ORDER — BUPROPION HCL ER (XL) 150 MG PO TB24: 150.0000 mg | ORAL_TABLET | Freq: Every day | ORAL | 3 refills | Status: DC

## 2023-05-14 MED ORDER — FLUOXETINE HCL 20 MG PO CAPS: ORAL_CAPSULE | ORAL | 3 refills | Status: DC

## 2023-05-14 NOTE — Addendum Note (Signed)
Addended by: Orson Aloe on: 05/14/2023 11:27 AM   Modules accepted: Orders

## 2023-05-14 NOTE — Progress Notes (Unsigned)
[facility]  Patient ID: Sherry Adams,  MRN: [id number]  PCP: Jarrett Soho, PA-C  Date of Evaluation: 05/14/2023 Time spent:20 minutes  Chief Complaint:  Chief Complaint   Anxiety; Depression; Follow-up    HISTORY/CURRENT STATUS: HPI  For f/u   She's doing much better since restarting the Wellbutrin.  She for is more like herself, doing things that she normally enjoys.  She is still tired a lot but maybe not quite as bad as before.  Motivation is better.  Work is going well.  She has started seeing a new counselor, having accelerated resolution therapy (ART) which she is finding very helpful.  She is sleeping pretty well.  Appetite is still suppressed somewhat but it did not improve while she was off the Wellbutrin.  She has started eating smaller more frequent meals hoping that will take care of that issue.  Personal hygiene is normal.  ADLs are normal.  Denies suicidal or homicidal thoughts.  States that attention is good without easy distractibility.  Able to focus on things and finish tasks to completion.   We increased the Klonopin dose at the last visit.  She is responding well to it.  Not having panic attacks but just gets overwhelmed at times.  Patient denies increased energy with decreased need for sleep, increased talkativeness, racing thoughts, impulsivity or risky behaviors, increased spending, increased libido, grandiosity, increased irritability or anger, paranoia, or hallucinations.  Denies dizziness, syncope, seizures, numbness, tingling, tremor, tics, unsteady gait, slurred speech, confusion. Denies muscle or joint pain, stiffness, or dystonia.  Individual Medical History/ Review of Systems: Changes? :No     Past medications for mental health diagnoses include: Zoloft, Effexor, Prozac, Klonopin, Wellbutrin worked well for a while but caused palpitations.  Allergies: Cetirizine, Tranexamic acid, Dog epithelium (canis lupus familiaris), Ibuprofen, and  Nsaids  Current Medications:  Current Outpatient Medications:    albuterol (ACCUNEB) 1.25 MG/3ML nebulizer solution, Take 3 mLs (1.25 mg total) by nebulization every 6 (six) hours as needed for wheezing., Disp: 100 mL, Rfl: 1   albuterol (VENTOLIN HFA) 108 (90 Base) MCG/ACT inhaler, INHALE 1 PUFF INTO THE LUNGS EVERY 6 HOURS AS NEEDED FOR WHEEZING OR SHORTNESS OF BREATH., Disp: 18 each, Rfl: 1   budesonide-formoterol (SYMBICORT) 160-4.5 MCG/ACT inhaler, Inhale 2 puffs into the lungs 2 (two) times daily., Disp: 10.2 g, Rfl: 5   Cholecalciferol (VITAMIN D) 10 MCG/ML LIQD, Take 500 Units by mouth daily at 12 noon., Disp: , Rfl:    clonazePAM (KLONOPIN) 1 MG tablet, Take 0.5-1 tablets (0.5-1 mg total) by mouth 2 (two) times daily as needed., Disp: 30 tablet, Rfl: 0   diphenhydrAMINE HCl (ALLERGY MEDICATION PO), Take by mouth., Disp: , Rfl:    famotidine (PEPCID) 40 MG tablet, Take 1 tablet (40 mg total) by mouth at bedtime., Disp: 90 tablet, Rfl: 1   fexofenadine (ALLEGRA) 180 MG tablet, Take 1 tablet (180 mg total) by mouth daily., Disp: 90 tablet, Rfl: 1   FLUoxetine (PROZAC) 10 MG capsule, TAKE 1 CAPSULE DAILY WITH THE 20 MG=30 MG DAILY., Disp: 30 capsule, Rfl: 1   fluticasone (FLONASE) 50 MCG/ACT nasal spray, 1-2 sprays each nostril 1 (ONE) Time per day., Disp: 16 g, Rfl: 5   Magnesium 500 MG CAPS, Take 1 capsule by mouth daily., Disp: , Rfl:    montelukast (SINGULAIR) 10 MG tablet, Take 1 tablet (10 mg total) by mouth at bedtime., Disp: 90 tablet, Rfl: 1   omeprazole (PRILOSEC) 20 MG capsule, Take 1 capsule (  20 mg total) by mouth in the morning., Disp: 90 capsule, Rfl: 1   rizatriptan (MAXALT) 10 MG tablet, Take 1 tablet (10 mg total) by mouth as needed for migraine. May repeat in 2 hours if needed, Disp: 10 tablet, Rfl: 6   rosuvastatin (CRESTOR) 20 MG tablet, Take 20 mg by mouth daily. Take 1 tablet every other day, Disp: , Rfl:    Topiramate ER (TROKENDI XR) 50 MG CP24, Take 1 capsule (50 mg  total) by mouth at bedtime., Disp: 30 capsule, Rfl: 11   buPROPion (WELLBUTRIN XL) 150 MG 24 hr tablet, Take 1 tablet (150 mg total) by mouth daily., Disp: 90 tablet, Rfl: 3   EPINEPHrine 0.3 mg/0.3 mL IJ SOAJ injection, Inject 0.3 mg into the muscle as needed for anaphylaxis. (Patient not taking: Reported on 05/14/2023), Disp: 2 each, Rfl: 1   FLUoxetine (PROZAC) 20 MG capsule, TAKE 1 CAPSULE EVERY DAY WITH 10MG  TO EQUAL 30MG , Disp: 90 capsule, Rfl: 3 Medication Side Effects: none  Family Medical/ Social History: Changes? none  MENTAL HEALTH EXAM:  There were no vitals taken for this visit.There is no height or weight on file to calculate BMI.  General Appearance: Casual, Neat and Well Groomed  Eye Contact:  Good  Speech:  Clear and Coherent and Normal Rate  Volume:  Normal  Mood:  Euthymic  Affect:  Congruent  Thought Process:  Goal Directed and Descriptions of Associations: Circumstantial  Orientation:  Full (Time, Place, and Person)  Thought Content: Logical   Suicidal Thoughts:  No  Homicidal Thoughts:  No  Memory:  WNL  Judgement:  Good  Insight:  Good  Psychomotor Activity:  Normal  Concentration:  Concentration: Good and Attention Span: Good  Recall:  Good  Fund of Knowledge: Good  Language: Good  Assets:  Communication Skills Desire for Improvement Financial Resources/Insurance Housing Physical Health Resilience Transportation Vocational/Educational  ADL's:  Intact  Cognition: WNL  Prognosis:  Good   DIAGNOSES:    ICD-10-CM   1. Recurrent major depressive disorder, in partial remission (HCC)  F33.41     2. PTSD (post-traumatic stress disorder)  F43.10     3. PMDD (premenstrual dysphoric disorder)  F32.81     4. Generalized anxiety disorder  F41.1       Receiving Psychotherapy: Yes    April  Fjeld  RECOMMENDATIONS:  PDMP reviewed.  Klonopin filled 04/02/2023. I provided 20 minutes of face to face time during this encounter, including time spent before  and after the visit in records review, medical decision making, counseling pertinent to today's visit, and charting.   I am glad to see her doing better!  No change in treatment needed.  Continue Wellbutrin XL 150 mg, 1 every day.  Continue Klonopin to 1 mg 1/2-1 twice daily as needed. Continue Prozac 30 mg every morning with then increased to 40 mg daily 1 week prior to menses and then go back to 30 mg after her menses begins.  Continue Topamax 50 mg, nightly per neurology. Continue therapy with April Fjeld. Return in 4 months.  Melony Overly, PA-C

## 2023-05-15 ENCOUNTER — Other Ambulatory Visit: Payer: Self-pay

## 2023-05-15 ENCOUNTER — Encounter: Payer: Self-pay | Admitting: Physician Assistant

## 2023-05-15 MED ORDER — BUDESONIDE-FORMOTEROL FUMARATE 160-4.5 MCG/ACT IN AERO: 2.0000 | INHALATION_SPRAY | Freq: Two times a day (BID) | RESPIRATORY_TRACT | 5 refills | Status: DC

## 2023-05-22 ENCOUNTER — Ambulatory Visit (INDEPENDENT_AMBULATORY_CARE_PROVIDER_SITE_OTHER): Payer: BC Managed Care – PPO

## 2023-05-22 DIAGNOSIS — J309 Allergic rhinitis, unspecified: Secondary | ICD-10-CM

## 2023-06-05 ENCOUNTER — Ambulatory Visit (INDEPENDENT_AMBULATORY_CARE_PROVIDER_SITE_OTHER)

## 2023-06-05 DIAGNOSIS — J309 Allergic rhinitis, unspecified: Secondary | ICD-10-CM
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
## 2023-06-11 ENCOUNTER — Telehealth: Payer: Self-pay | Admitting: Neurology

## 2023-06-11 ENCOUNTER — Telehealth: Payer: Self-pay

## 2023-06-11 ENCOUNTER — Encounter: Payer: Self-pay | Admitting: Neurology

## 2023-06-11 ENCOUNTER — Other Ambulatory Visit (HOSPITAL_COMMUNITY): Payer: Self-pay

## 2023-06-11 ENCOUNTER — Ambulatory Visit (INDEPENDENT_AMBULATORY_CARE_PROVIDER_SITE_OTHER): Payer: BC Managed Care – PPO | Admitting: Neurology

## 2023-06-11 VITALS — BP 123/73 | HR 62 | Ht 69.0 in | Wt 230.6 lb

## 2023-06-11 DIAGNOSIS — G43709 Chronic migraine without aura, not intractable, without status migrainosus: Secondary | ICD-10-CM

## 2023-06-11 MED ORDER — TOPIRAMATE ER 25 MG PO CAP24: 25.0000 mg | ORAL_CAPSULE | Freq: Every day | ORAL | 0 refills | Status: DC

## 2023-06-11 MED ORDER — NURTEC 75 MG PO TBDP: 75.0000 mg | ORAL_TABLET | ORAL | 11 refills | Status: DC

## 2023-06-11 NOTE — Telephone Encounter (Signed)
 Pharmacy Patient Advocate Encounter   Received notification from Patient Pharmacy that prior authorization for Nurtec 75MG  dispersible tablets is required/requested.   Insurance verification completed.   The patient is insured through [employer] .   Per test claim: PA required; PA submitted to above mentioned insurance via CoverMyMeds Key/confirmation #/EOC [facility] Status is pending

## 2023-06-11 NOTE — Telephone Encounter (Signed)
 Pharmacy Patient Advocate Encounter  Received notification from [facility] that Prior Authorization for Nurtec 75MG  dispersible tablets has been APPROVED from 06/11/2023 to 06/10/2024. Ran test claim, Copay is $0. This test claim was processed through [facility]- copay amounts may vary at other pharmacies due to pharmacy/plan contracts, or as the patient moves through the different stages of their insurance plan.   PA #/Case ID/Reference #: PA Case ID #: [id number]

## 2023-06-11 NOTE — Telephone Encounter (Signed)
 Pharmacy Patient Advocate Encounter   Received notification from Physician's Office that prior authorization for Topiramate ER 25MG  er capsules is required/requested.   Insurance verification completed.   The patient is insured through [employer] .   Per test claim: PA required; PA started via CoverMyMeds. KEY [id number] . Waiting for clinical questions to populate.

## 2023-06-11 NOTE — Patient Instructions (Addendum)
 Start Nurtec 75 mg tablet every other day for migraine prevention with Topamax ER 50 mg for 2 weeks, then reduce the Topamax ER 25 mg for 2 weeks, then stop it. Continue Maxalt is needed.

## 2023-06-12 ENCOUNTER — Other Ambulatory Visit (HOSPITAL_COMMUNITY): Payer: Self-pay

## 2023-06-12 NOTE — Telephone Encounter (Signed)
 Pharmacy Patient Advocate Encounter  Received notification from [facility] that Prior Authorization for Topiramate ER 25mg  cap has been APPROVED from 05/22/2023 to 06/11/2024. Ran test claim, Copay is $12.00. This test claim was processed through [facility]- copay amounts may vary at other pharmacies due to pharmacy/plan contracts, or as the patient moves through the different stages of their insurance plan.   PA #/Case ID/Reference #: [id number]

## 2023-06-12 NOTE — Telephone Encounter (Signed)
 Received a faxed form requesting additional information-faxed completed form and clinicals to [facility] as an URGENT request to fax [phone]

## 2023-06-13 ENCOUNTER — Other Ambulatory Visit: Payer: Self-pay | Admitting: Physician Assistant

## 2023-06-13 ENCOUNTER — Ambulatory Visit (INDEPENDENT_AMBULATORY_CARE_PROVIDER_SITE_OTHER): Payer: Self-pay

## 2023-06-13 DIAGNOSIS — J309 Allergic rhinitis, unspecified: Secondary | ICD-10-CM | POA: Diagnosis not present

## 2023-06-20 ENCOUNTER — Ambulatory Visit (INDEPENDENT_AMBULATORY_CARE_PROVIDER_SITE_OTHER): Payer: Self-pay

## 2023-06-20 DIAGNOSIS — J309 Allergic rhinitis, unspecified: Secondary | ICD-10-CM

## 2023-06-22 ENCOUNTER — Other Ambulatory Visit: Payer: Self-pay | Admitting: Neurology

## 2023-07-01 ENCOUNTER — Ambulatory Visit (INDEPENDENT_AMBULATORY_CARE_PROVIDER_SITE_OTHER): Payer: Self-pay

## 2023-07-01 DIAGNOSIS — J309 Allergic rhinitis, unspecified: Secondary | ICD-10-CM

## 2023-07-14 ENCOUNTER — Ambulatory Visit (INDEPENDENT_AMBULATORY_CARE_PROVIDER_SITE_OTHER): Payer: Self-pay

## 2023-07-14 DIAGNOSIS — J309 Allergic rhinitis, unspecified: Secondary | ICD-10-CM | POA: Diagnosis not present

## 2023-07-22 ENCOUNTER — Encounter: Payer: Self-pay | Admitting: Cardiology

## 2023-07-22 ENCOUNTER — Ambulatory Visit: Payer: BC Managed Care – PPO | Attending: Cardiology | Admitting: Cardiology

## 2023-07-22 VITALS — BP 120/88 | HR 63 | Ht 69.0 in | Wt 228.4 lb

## 2023-07-22 DIAGNOSIS — R7303 Prediabetes: Secondary | ICD-10-CM | POA: Diagnosis not present

## 2023-07-22 DIAGNOSIS — Z8249 Family history of ischemic heart disease and other diseases of the circulatory system: Secondary | ICD-10-CM

## 2023-07-22 DIAGNOSIS — Z6833 Body mass index (BMI) 33.0-33.9, adult: Secondary | ICD-10-CM

## 2023-07-22 DIAGNOSIS — E7800 Pure hypercholesterolemia, unspecified: Secondary | ICD-10-CM

## 2023-07-22 DIAGNOSIS — R072 Precordial pain: Secondary | ICD-10-CM | POA: Diagnosis not present

## 2023-07-22 DIAGNOSIS — E781 Pure hyperglyceridemia: Secondary | ICD-10-CM | POA: Diagnosis not present

## 2023-07-22 DIAGNOSIS — E66811 Obesity, class 1: Secondary | ICD-10-CM

## 2023-07-22 DIAGNOSIS — E6609 Other obesity due to excess calories: Secondary | ICD-10-CM

## 2023-07-22 MED ORDER — METOPROLOL TARTRATE 25 MG PO TABS: ORAL_TABLET | ORAL | 0 refills | Status: DC

## 2023-07-22 NOTE — Progress Notes (Signed)
 Cardiology Office Note:    NAME:  Sherry Adams    MRN: [id number] DOB:  07-23-1970   PCP:  Darnelle Elders, PA-C  Former Cardiology Providers: None Primary Cardiologist:  Olinda Bertrand, DO, [facility] (established care 07/22/2023) Electrophysiologist:  None   Referring MD: Darnelle Elders, PA-C  Reason of Consult: Hyperlipidemia  Chief Complaint  Patient presents with   Chest Pain    Not today   New Patient (Initial Visit)    History of Present Illness:    Sherry Adams is a 53 y.o. Caucasian female whose past medical history and cardiovascular risk factors includes: asthma, GERD, HLD (suggestive of familial hypercholesterolemia), hypertriglyceridemia, depression, family hx of CAD, prediabetes, obesity. She is being seen today for the evaluation of management of hyperlipidemia and family history of heart disease at the request of Darnelle Elders, PA-C.  History of Present Illness Sherry Adams is a 53 year old female with hyperlipidemia who presents with chest pain.  She has been experiencing chest pain for several months, described as a clenching sensation slightly left of the sternum, rated 4 to 5 out of 10, and lasting up to 20 minutes. The pain is sometimes associated with emotional stress and exertion, such as climbing stairs, particularly by the third flight. It typically resolves on its own without specific intervention, though once it was relieved by sitting up, which she felt was related to reflux. Since resuming daily statin use, the chest pain has subsided, with the last episode occurring approximately two months ago.  She initially experienced chest pain after discontinuing her statin medication, which led to a significant increase in her cholesterol levels from 279 to 329. She resumed daily statin use, specifically rosuvastatin, and added fish oil to her regimen, which helped reduce her triglycerides. She is currently taking rosuvastatin daily and omega-3 supplements. She  is also on Prozac  for depression.  Her family history is significant for heart disease; her father had two heart attacks, one in his late forties and another after fifty, both attributed to stress. Her mother has mitral valve prolapse and underwent surgical repair. She is concerned about potential blockages due to her family history and previous chest pain episodes.  Current Medications: Current Meds  Medication Sig   albuterol  (ACCUNEB ) 1.25 MG/3ML nebulizer solution Take 3 mLs (1.25 mg total) by nebulization every 6 (six) hours as needed for wheezing.   albuterol  (VENTOLIN  HFA) 108 (90 Base) MCG/ACT inhaler INHALE 1 PUFF INTO THE LUNGS EVERY 6 HOURS AS NEEDED FOR WHEEZING OR SHORTNESS OF BREATH.   budesonide -formoterol  (SYMBICORT ) 160-4.5 MCG/ACT inhaler Inhale 2 puffs into the lungs 2 (two) times daily.   buPROPion  (WELLBUTRIN  XL) 150 MG 24 hr tablet Take 1 tablet (150 mg total) by mouth daily.   Cholecalciferol (VITAMIN D) 10 MCG/ML LIQD Take 500 Units by mouth daily at 12 noon.   clonazePAM  (KLONOPIN ) 1 MG tablet Take 0.5-1 tablets (0.5-1 mg total) by mouth 2 (two) times daily as needed.   EPINEPHrine  0.3 mg/0.3 mL IJ SOAJ injection Inject 0.3 mg into the muscle as needed for anaphylaxis.   famotidine  (PEPCID ) 40 MG tablet Take 1 tablet (40 mg total) by mouth at bedtime.   fexofenadine  (ALLEGRA ) 180 MG tablet Take 1 tablet (180 mg total) by mouth daily.   FLUoxetine  (PROZAC ) 10 MG capsule TAKE 1 CAPSULE DAILY WITH THE 20 MG=30 MG DAILY.   FLUoxetine  (PROZAC ) 20 MG capsule TAKE 1 CAPSULE EVERY DAY WITH 10MG  TO EQUAL 30MG    fluticasone  (FLONASE ) 50 MCG/ACT  nasal spray 1-2 sprays each nostril 1 (ONE) Time per day.   Magnesium 500 MG CAPS Take 1 capsule by mouth daily.   metoprolol  tartrate (LOPRESSOR ) 25 MG tablet Take 1/2 tablet twice daily starting 2 days prior to CT scan   montelukast  (SINGULAIR ) 10 MG tablet Take 1 tablet (10 mg total) by mouth at bedtime.   omeprazole  (PRILOSEC) 20 MG  capsule Take 1 capsule (20 mg total) by mouth in the morning.   Rimegepant Sulfate (NURTEC) 75 MG TBDP Take 1 tablet (75 mg total) by mouth every other day.   rizatriptan  (MAXALT ) 10 MG tablet Take 1 tablet (10 mg total) by mouth as needed for migraine. May repeat in 2 hours if needed   rosuvastatin (CRESTOR) 20 MG tablet Take 20 mg by mouth daily. Take 1 tablet every other day   Topiramate  ER (TROKENDI  XR) 50 MG CP24 Take 1 capsule (50 mg total) by mouth at bedtime.     Allergies:    Cetirizine, Tranexamic acid, Dog epithelium (canis lupus familiaris), Ibuprofen, and Nsaids   Past Medical History: Past Medical History:  Diagnosis Date   Allergic rhinitis 06/21/2019   Asthma    Chronic migraine w/o aura w/o status migrainosus, not intractable 05/29/2022   COVID-19 virus detected 05/27/2019   04/18/2019-SARS-CoV-2-positive ([facility] rapid test)  Did not require hospitalization, did not receive monoclonal antibody infusion     Eating disorder 03/29/2020   Endometriosis    Frequent headaches    Generalized anxiety disorder 03/29/2020   GERD (gastroesophageal reflux disease)    Mild persistent asthma without complication 10/22/2016   Onset in Childhood  Allergy  profile 10/22/2016 >  Eos 0.3/  IgE 73  RAST pos dust > dog> cat   Tree> grass, min mold   - Spirometry 10/22/2016  wnl though mild curvature on budesonide  neb  s saba prior  - 10/22/2016   try symb 160 2bid  - 05/29/2018  After extensive coaching inhaler device,  effectiveness =    90%           Mixed hyperlipidemia    Panic attacks    PMDD (premenstrual dysphoric disorder) 03/29/2020    Past Surgical History: Past Surgical History:  Procedure Laterality Date   LAPAROSCOPY     for endometriosis   WISDOM TOOTH EXTRACTION      Social History: Social History   Tobacco Use   Smoking status: Never    Passive exposure: Never   Smokeless tobacco: Never  Vaping Use   Vaping status: Never Used  Substance Use Topics   Alcohol use: Not  Currently   Drug use: Never    Family History: Family History  Problem Relation Age of Onset   Heart disease Father    Diabetes Father    Hypertension Father    Breast cancer Sister    Alcohol abuse Sister    Pulmonary fibrosis Maternal Aunt    Asthma Maternal Aunt    Ovarian cancer Paternal Aunt    Depression Mother    CVA Mother    Prostate cancer Maternal Grandfather    Diabetes Maternal Grandfather    Anxiety disorder Maternal Grandmother    Depression Maternal Grandmother    Diabetes Paternal Grandmother    Hypertension Paternal Grandmother     ROS:   Review of Systems  Cardiovascular:  Positive for chest pain (see HPI). Negative for claudication, irregular heartbeat, leg swelling, near-syncope, orthopnea, palpitations, paroxysmal nocturnal dyspnea and syncope.  Respiratory:  Negative for shortness of breath.  Hematologic/Lymphatic: Negative for bleeding problem.    EKGs/Labs/Other Studies Reviewed:   EKG: EKG Interpretation Date/Time:  Tuesday July 22 2023 11:15:13 EDT Ventricular Rate:  67 PR Interval:  186 QRS Duration:  122 QT Interval:  414 QTC Calculation: 437 R Axis:   9  Text Interpretation: Normal sinus rhythm Non-specific intra-ventricular conduction delay Nonspecific T wave abnormality No previous ECGs available Confirmed by Olinda Bertrand [phone]) on 07/22/2023 11:25:11 AM  Echocardiogram: None  Coronary calcium score 05/05/2023 Total coronary calcium score is 0  Labs: External Labs: Collected: January 10, 2022 provided by PCP. Total cholesterol 197, triglycerides 286, HDL 43, LDL calculated 105, non-HDL 154  Collected: February 19, 2023 provided by PCP Total cholesterol 279, triglycerides 451, HDL 47, LDL calculated 148, non-HDL 232 BUN 11, creatinine 0.79. Sodium 139, potassium 4.8, chloride 104. AST, ALT, alkaline phosphatase within normal limits  Collected: April 28, 2023 provided by PCP. Total cholesterol 329, triglycerides 290,  HDL 47, LDL calculated 222, non-HDL 282 A1c 5.8  Physical Exam:    Today's Vitals   07/22/23 1115  BP: 120/88  Pulse: 63  SpO2: 98%  Weight: 228 lb 6.4 oz (103.6 kg)  Height: 5\' 9"  (1.753 m)   Body mass index is 33.73 kg/m. Wt Readings from Last 3 Encounters:  07/22/23 228 lb 6.4 oz (103.6 kg)  06/11/23 230 lb 9.6 oz (104.6 kg)  05/13/23 228 lb 4.8 oz (103.6 kg)    Physical Exam  Constitutional: No distress.  hemodynamically stable  Neck: No JVD present.  Cardiovascular: Normal rate, regular rhythm, S1 normal and S2 normal. Exam reveals no gallop, no S3 and no S4.  No murmur heard. Pulmonary/Chest: Effort normal and breath sounds normal. No stridor. She has no wheezes. She has no rales.  Musculoskeletal:        General: No edema.     Cervical back: Neck supple.  Skin: Skin is warm.     Impression & Recommendation(s):  Impression:   ICD-10-CM   1. Precordial pain  R07.2 Basic metabolic panel with GFR    ECHOCARDIOGRAM COMPLETE    Basic metabolic panel with GFR    metoprolol  tartrate (LOPRESSOR ) 25 MG tablet    2. Pure hypercholesterolemia  E78.00 EKG 12-Lead    3. Pure hypertriglyceridemia  E78.1     4. Prediabetes  R73.03     5. Family history of premature CAD  Z82.49     6. Class 1 obesity due to excess calories with serious comorbidity and body mass index (BMI) of 33.0 to 33.9 in adult  E66.811    E66.09    Z68.33        Recommendation(s):  Precordial pain Intermittent chest pain with mixed features, some suggestive of cardiac origin. No recent episodes since starting rosuvastatin. Differential includes cardiac etiology and reflux. Family history of premature heart disease. Advised emergency care if symptoms recur due to potential cardiac risk. - Order coronary CT angiography to assess for plaque and blockages. - Prescribe Lopressor  12.5 mg BID for 48 hours prior to CT scan to control heart rate. - Order blood work to check kidney function prior to CT  scan. - Order echocardiogram to evaluate heart function.  Pure hypercholesterolemia Pure hypertriglyceridemia Hyperlipidemia with recent cholesterol increase after discontinuing statin. Currently on rosuvastatin and omega-3 supplements. Family history suggests genetic component. Potential need for PCSK9 inhibitors if LDL remains high. - Continue rosuvastatin daily. - Recheck cholesterol levels at upcoming appointment. - Bring cholesterol panel results to next visit for  review.  Prediabetes Reemphasized importance of glycemic control. Will follow-up with PCP.  Class 1 obesity due to excess calories with serious comorbidity and body mass index (BMI) of 33.0 to 33.9 in adult Body mass index is 33.73 kg/m. I reviewed with her importance of diet, regular physical activity/exercise, weight loss.   Patient is educated on the importance of increasing physical activity gradually as tolerated with a goal of moderate intensity exercise for 30 minutes a day 5 days a week.  Orders Placed:  Orders Placed This Encounter  Procedures   Basic metabolic panel with GFR    Standing Status:   Future    Number of Occurrences:   1    Expected Date:   07/29/2023    Expiration Date:   07/21/2024   EKG 12-Lead   ECHOCARDIOGRAM COMPLETE    Standing Status:   Future    Expected Date:   07/29/2023    Expiration Date:   07/21/2024    Where should this test [facility] performed[facility]    Does the patient weigh less than or greater than 250 lbs?:   Patient weighs less than 250 lbs    Perflutren DEFINITY (image enhancing agent) should be administered unless hypersensitivity or allergy  exist:   Administer Perflutren    Reason for exam-Echo:   Other-Full Diagnosis List    Full ICD-10/Reason for Exam:   Precordial pain [786.51.ICD-9-CM]     Final Medication List:    Meds ordered this encounter  Medications   metoprolol  tartrate (LOPRESSOR ) 25 MG tablet    Sig: Take 1/2 tablet twice daily starting 2 days  prior to CT scan    Dispense:  3 tablet    Refill:  0    Medications Discontinued During This Encounter  Medication Reason   Topiramate  ER (TROKENDI  XR) 25 MG CP24 Change in therapy   budesonide -formoterol  (SYMBICORT ) 160-4.5 MCG/ACT inhaler No longer needed (for PRN medications)   diphenhydrAMINE HCl (ALLERGY  MEDICATION PO) No longer needed (for PRN medications)     Current Outpatient Medications:    albuterol  (ACCUNEB ) 1.25 MG/3ML nebulizer solution, Take 3 mLs (1.25 mg total) by nebulization every 6 (six) hours as needed for wheezing., Disp: 100 mL, Rfl: 1   albuterol  (VENTOLIN  HFA) 108 (90 Base) MCG/ACT inhaler, INHALE 1 PUFF INTO THE LUNGS EVERY 6 HOURS AS NEEDED FOR WHEEZING OR SHORTNESS OF BREATH., Disp: 18 each, Rfl: 1   budesonide -formoterol  (SYMBICORT ) 160-4.5 MCG/ACT inhaler, Inhale 2 puffs into the lungs 2 (two) times daily., Disp: 10.2 g, Rfl: 5   buPROPion  (WELLBUTRIN  XL) 150 MG 24 hr tablet, Take 1 tablet (150 mg total) by mouth daily., Disp: 90 tablet, Rfl: 3   Cholecalciferol (VITAMIN D) 10 MCG/ML LIQD, Take 500 Units by mouth daily at 12 noon., Disp: , Rfl:    clonazePAM  (KLONOPIN ) 1 MG tablet, Take 0.5-1 tablets (0.5-1 mg total) by mouth 2 (two) times daily as needed., Disp: 30 tablet, Rfl: 0   EPINEPHrine  0.3 mg/0.3 mL IJ SOAJ injection, Inject 0.3 mg into the muscle as needed for anaphylaxis., Disp: 2 each, Rfl: 1   famotidine  (PEPCID ) 40 MG tablet, Take 1 tablet (40 mg total) by mouth at bedtime., Disp: 90 tablet, Rfl: 1   fexofenadine  (ALLEGRA ) 180 MG tablet, Take 1 tablet (180 mg total) by mouth daily., Disp: 90 tablet, Rfl: 1   FLUoxetine  (PROZAC ) 10 MG capsule, TAKE 1 CAPSULE DAILY WITH THE 20 MG=30 MG DAILY., Disp: 30 capsule, Rfl: 2  FLUoxetine  (PROZAC ) 20 MG capsule, TAKE 1 CAPSULE EVERY DAY WITH 10MG  TO EQUAL 30MG , Disp: 90 capsule, Rfl: 3   fluticasone  (FLONASE ) 50 MCG/ACT nasal spray, 1-2 sprays each nostril 1 (ONE) Time per day., Disp: 16 g, Rfl: 5    Magnesium 500 MG CAPS, Take 1 capsule by mouth daily., Disp: , Rfl:    metoprolol  tartrate (LOPRESSOR ) 25 MG tablet, Take 1/2 tablet twice daily starting 2 days prior to CT scan, Disp: 3 tablet, Rfl: 0   montelukast  (SINGULAIR ) 10 MG tablet, Take 1 tablet (10 mg total) by mouth at bedtime., Disp: 90 tablet, Rfl: 1   omeprazole  (PRILOSEC) 20 MG capsule, Take 1 capsule (20 mg total) by mouth in the morning., Disp: 90 capsule, Rfl: 1   Rimegepant Sulfate (NURTEC) 75 MG TBDP, Take 1 tablet (75 mg total) by mouth every other day., Disp: 16 tablet, Rfl: 11   rizatriptan  (MAXALT ) 10 MG tablet, Take 1 tablet (10 mg total) by mouth as needed for migraine. May repeat in 2 hours if needed, Disp: 10 tablet, Rfl: 6   rosuvastatin (CRESTOR) 20 MG tablet, Take 20 mg by mouth daily. Take 1 tablet every other day, Disp: , Rfl:    Topiramate  ER (TROKENDI  XR) 50 MG CP24, Take 1 capsule (50 mg total) by mouth at bedtime., Disp: 30 capsule, Rfl: 11  Consent:   NA  Disposition:   10 weeks sooner if needed.  Patient may be asked to follow-up sooner based on the results of the above-mentioned testing.  Her questions and concerns were addressed to her satisfaction. She voices understanding of the recommendations provided during this encounter.    Signed, Awilda Bogus, [facility]  [facility]  07/27/2023 7:18 PM

## 2023-07-22 NOTE — Patient Instructions (Addendum)
 Medication Instructions:  Your physician has recommended you make the following change in your medication:   START Metoprolol Tartrate (Lopressor) 12.5 mg twice daily starting 2 days prior to scan. Once scan is complete, STOP taking medication. HOLD if systolic blood pressure (top number) is less than 100 and/ or heart rate is less than 55.    *If you need a refill on your cardiac medications before your next appointment, please call your pharmacy*  Lab Work: To be completed 1 week prior to scan: BMP  If you have labs (blood work) drawn today and your tests are completely normal, you will receive your results only by: MyChart Message (if you have MyChart) OR A paper copy in the mail If you have any lab test that is abnormal or we need to change your treatment, we will call you to review the results.  Testing/Procedures: Your physician has requested that you have a coronary CTA. Non-Cardiac CT Angiography (CTA), is a special type of CT scan that uses a computer to produce multi-dimensional views of major blood vessels throughout the body. In CT angiography, a contrast material is injected through an IV to help visualize the blood vessels  Your physician has requested that you have an echocardiogram. Echocardiography is a painless test that uses sound waves to create images of your heart. It provides your doctor with information about the size and shape of your heart and how well your heart's chambers and valves are working. This procedure takes approximately one hour. There are no restrictions for this procedure. Please do NOT wear cologne, perfume, aftershave, or lotions (deodorant is allowed). Please arrive 15 minutes prior to your appointment time.  Please note: We ask at that you not bring children with you during ultrasound (echo/ vascular) testing. Due to room size and safety concerns, children are not allowed in the ultrasound rooms during exams. Our front office staff cannot provide  observation of children in our lobby area while testing is being conducted. An adult accompanying a patient to their appointment will only be allowed in the ultrasound room at the discretion of the ultrasound technician under special circumstances. We apologize for any inconvenience.   Follow-Up: At [facility], you and your health needs are our priority.  As part of our continuing mission to provide you with exceptional heart care, we have created designated Provider Care Teams.  These Care Teams include your primary Cardiologist (physician) and Advanced Practice Providers (APPs -  Physician Assistants and Nurse Practitioners) who all work together to provide you with the care you need, when you need it.  We recommend signing up for the patient portal called "MyChart".  Sign up information is provided on this After Visit Summary.  MyChart is used to connect with patients for Virtual Visits (Telemedicine).  Patients are able to view lab/test results, encounter notes, upcoming appointments, etc.  Non-urgent messages can be sent to your provider as well.   To learn more about what you can do with MyChart, go to [email or web address].    Your next appointment:   10 week(s)  The format for your next appointment:   In Person  Provider:   Olinda Bertrand, [facility]  Other Instructions    Your cardiac CT will be scheduled at    [facility] [address]  [place], [place] [address] Opening July 21, 2023   If scheduled at the Heart [facility] at [employer], please enter the parking lot using the [address] street  entrance and use the FREE valet service at the patient drop-off area. Enter the buidling and check-in with registration on the main floor  Please follow these instructions carefully (unless otherwise directed):  An IV will be required for this test and Nitroglycerin will be given.   On the Night Before the Test: Be sure to Drink plenty of  water. Do not consume any caffeinated/decaffeinated beverages or chocolate 12 hours prior to your test. Do not take any antihistamines 12 hours prior to your test.   On the Day of the Test: Drink plenty of water until 1 hour prior to the test. Do not eat any food 1 hour prior to test. You may take your regular medications prior to the test.  TAKE METOPROLOL TARTRATE 12.5 MG TWICE DAILY STARTING 2 DAYS PRIOR TO SCAN=1/2 OF 25 MG TABLET FEMALES- please wear underwire-free bra if available, avoid dresses & tight clothing      After the Test: Drink plenty of water. After receiving IV contrast, you may experience a mild flushed feeling. This is normal. On occasion, you may experience a mild rash up to 24 hours after the test. This is not dangerous. If this occurs, you can take Benadryl 25 mg, Zyrtec, Claritin, or Allegra  and increase your fluid intake. (Patients taking Tikosyn should avoid Benadryl, and may take Zyrtec, Claritin, or Allegra ) If you experience trouble breathing, this can be serious. If it is severe call 911 IMMEDIATELY. If it is mild, please call our office.  We will call to schedule your test 2-4 weeks out understanding that some insurance companies will need an authorization prior to the service being performed.   For more information and frequently asked questions, please visit our website : [email or web address]  For non-scheduling related questions, please contact the cardiac imaging nurse navigator should you have any questions/concerns: Cardiac Imaging Nurse Navigators Direct Office Dial: [phone]   For scheduling needs, including cancellations and rescheduling, please call [place], [phone].

## 2023-07-24 ENCOUNTER — Ambulatory Visit (INDEPENDENT_AMBULATORY_CARE_PROVIDER_SITE_OTHER): Payer: Self-pay

## 2023-07-24 DIAGNOSIS — J309 Allergic rhinitis, unspecified: Secondary | ICD-10-CM | POA: Diagnosis not present

## 2023-07-27 ENCOUNTER — Encounter: Payer: Self-pay | Admitting: Cardiology

## 2023-07-28 ENCOUNTER — Other Ambulatory Visit: Payer: Self-pay

## 2023-07-28 DIAGNOSIS — R072 Precordial pain: Secondary | ICD-10-CM

## 2023-07-28 NOTE — Progress Notes (Signed)
Order for coronary CTA placed.

## 2023-07-29 ENCOUNTER — Ambulatory Visit (INDEPENDENT_AMBULATORY_CARE_PROVIDER_SITE_OTHER): Payer: Self-pay

## 2023-07-29 DIAGNOSIS — J309 Allergic rhinitis, unspecified: Secondary | ICD-10-CM | POA: Diagnosis not present

## 2023-08-11 ENCOUNTER — Ambulatory Visit (INDEPENDENT_AMBULATORY_CARE_PROVIDER_SITE_OTHER): Payer: Self-pay

## 2023-08-11 DIAGNOSIS — J309 Allergic rhinitis, unspecified: Secondary | ICD-10-CM

## 2023-08-12 ENCOUNTER — Ambulatory Visit (HOSPITAL_COMMUNITY)

## 2023-08-12 ENCOUNTER — Other Ambulatory Visit: Payer: Self-pay | Admitting: Physician Assistant

## 2023-08-22 ENCOUNTER — Ambulatory Visit (INDEPENDENT_AMBULATORY_CARE_PROVIDER_SITE_OTHER): Payer: Self-pay

## 2023-08-22 DIAGNOSIS — J309 Allergic rhinitis, unspecified: Secondary | ICD-10-CM

## 2023-08-25 ENCOUNTER — Telehealth: Payer: Self-pay | Admitting: Cardiology

## 2023-08-25 ENCOUNTER — Ambulatory Visit (HOSPITAL_COMMUNITY)

## 2023-08-25 NOTE — Telephone Encounter (Signed)
 Patient missed their appointment for their Echo and was wanting to r/s their Echo. When scheduling the appointment, the soonest available date was in July and the auth for the Echo expires on 09/20/23. Patient would like us  to extend the auth or get new auth for the Echo. Please advise.

## 2023-08-29 ENCOUNTER — Ambulatory Visit (INDEPENDENT_AMBULATORY_CARE_PROVIDER_SITE_OTHER): Payer: Self-pay | Admitting: *Deleted

## 2023-08-29 DIAGNOSIS — J309 Allergic rhinitis, unspecified: Secondary | ICD-10-CM | POA: Diagnosis not present

## 2023-09-10 ENCOUNTER — Ambulatory Visit: Payer: BC Managed Care – PPO | Admitting: Physician Assistant

## 2023-09-15 DIAGNOSIS — J3089 Other allergic rhinitis: Secondary | ICD-10-CM | POA: Diagnosis not present

## 2023-09-15 NOTE — Progress Notes (Signed)
 VIALS MADE 09-15-23

## 2023-09-16 ENCOUNTER — Telehealth (HOSPITAL_COMMUNITY): Payer: Self-pay | Admitting: *Deleted

## 2023-09-16 DIAGNOSIS — J3081 Allergic rhinitis due to animal (cat) (dog) hair and dander: Secondary | ICD-10-CM | POA: Diagnosis not present

## 2023-09-16 NOTE — Telephone Encounter (Signed)
 Attempted to call patient regarding upcoming cardiac CT appointment. Left message on voicemail with name and callback number Johney Frame RN Navigator Cardiac Imaging [facility] [phone] Office

## 2023-09-17 ENCOUNTER — Ambulatory Visit (HOSPITAL_COMMUNITY)
Admission: RE | Admit: 2023-09-17 | Discharge: 2023-09-17 | Disposition: A | Discharge status: Home / Self Care | Source: Ambulatory Visit | Attending: Cardiology | Admitting: Cardiology

## 2023-09-17 DIAGNOSIS — R072 Precordial pain: Secondary | ICD-10-CM | POA: Insufficient documentation

## 2023-09-17 MED ORDER — IOHEXOL 350 MG/ML SOLN
100.0000 mL | Freq: Once | INTRAVENOUS | Status: AC | PRN
  Administered 2023-09-17: 100 mL via INTRAVENOUS

## 2023-09-17 MED ORDER — NITROGLYCERIN 0.4 MG SL SUBL
0.8000 mg | SUBLINGUAL_TABLET | Freq: Once | SUBLINGUAL | Status: AC
  Administered 2023-09-17: 0.8 mg via SUBLINGUAL

## 2023-09-18 ENCOUNTER — Ambulatory Visit

## 2023-09-18 DIAGNOSIS — J309 Allergic rhinitis, unspecified: Secondary | ICD-10-CM | POA: Diagnosis not present

## 2023-09-19 ENCOUNTER — Ambulatory Visit: Payer: Self-pay | Admitting: Cardiology

## 2023-09-29 ENCOUNTER — Other Ambulatory Visit: Payer: Self-pay | Admitting: Allergy and Immunology

## 2023-09-29 ENCOUNTER — Encounter: Payer: Self-pay | Admitting: Cardiology

## 2023-09-29 ENCOUNTER — Ambulatory Visit: Attending: Cardiology | Admitting: Cardiology

## 2023-09-29 ENCOUNTER — Other Ambulatory Visit: Payer: Self-pay | Admitting: Physician Assistant

## 2023-09-29 ENCOUNTER — Other Ambulatory Visit: Payer: Self-pay | Admitting: Neurology

## 2023-09-29 VITALS — BP 118/80 | HR 70 | Resp 16 | Ht 69.0 in | Wt 230.6 lb

## 2023-09-29 DIAGNOSIS — R072 Precordial pain: Secondary | ICD-10-CM | POA: Diagnosis not present

## 2023-09-29 DIAGNOSIS — E781 Pure hyperglyceridemia: Secondary | ICD-10-CM | POA: Diagnosis not present

## 2023-09-29 DIAGNOSIS — E6609 Other obesity due to excess calories: Secondary | ICD-10-CM

## 2023-09-29 DIAGNOSIS — Z8249 Family history of ischemic heart disease and other diseases of the circulatory system: Secondary | ICD-10-CM | POA: Diagnosis not present

## 2023-09-29 DIAGNOSIS — Z6834 Body mass index (BMI) 34.0-34.9, adult: Secondary | ICD-10-CM

## 2023-09-29 DIAGNOSIS — E66811 Obesity, class 1: Secondary | ICD-10-CM

## 2023-09-29 DIAGNOSIS — E7800 Pure hypercholesterolemia, unspecified: Secondary | ICD-10-CM | POA: Diagnosis not present

## 2023-09-29 NOTE — Progress Notes (Signed)
 Cardiology Office Note:    NAME:  Sherry Adams    MRN: [id number] DOB:  1970/08/19   PCP:  Katina Pfeiffer, PA-C  Former Cardiology Providers: None Primary Cardiologist:  Madonna Large, DO, [facility] (established care 07/22/2023) Electrophysiologist:  None   Chief Complaint  Patient presents with   Chest Pain   Follow-up    History of Present Illness:    Sherry Adams is a 53 y.o. Caucasian female whose past medical history and cardiovascular risk factors includes: asthma, GERD, HLD (suggestive of familial hypercholesterolemia), hypertriglyceridemia, depression, family hx of CAD, prediabetes, obesity.   In April 2025, patient was referred to the practice for evaluation and management of hyperlipidemia and family history of heart disease/chest pain evaluation.  Given her precordial pain, risk factors, and family history shared decision was to proceed with coronary CTA.  She has a total coronary calcium score of 0, and no evidence of CAD.  Echocardiogram is still pending. And she was on Crestor 20 mg p.o. daily  with plans to recheck labs prior to next office visit.    Since last office visit, denies anginal chest pain or heart failure symptoms.  Lipids have significantly improved after starting Crestor.  Outside labs reviewed.  Her family history is significant for heart disease; her father had two heart attacks, one in his late forties and another after fifty, both attributed to stress. Her mother has mitral valve prolapse and underwent surgical repair.   Current Medications: Current Meds  Medication Sig   albuterol  (ACCUNEB ) 1.25 MG/3ML nebulizer solution Take 3 mLs (1.25 mg total) by nebulization every 6 (six) hours as needed for wheezing.   albuterol  (VENTOLIN  HFA) 108 (90 Base) MCG/ACT inhaler INHALE 1 PUFF INTO THE LUNGS EVERY 6 HOURS AS NEEDED FOR WHEEZING OR SHORTNESS OF BREATH.   budesonide -formoterol  (SYMBICORT ) 160-4.5 MCG/ACT inhaler Inhale 2 puffs into the lungs 2 (two)  times daily.   buPROPion  (WELLBUTRIN  XL) 150 MG 24 hr tablet Take 1 tablet (150 mg total) by mouth daily.   Cholecalciferol (VITAMIN D) 10 MCG/ML LIQD Take 500 Units by mouth daily at 12 noon.   clonazePAM  (KLONOPIN ) 1 MG tablet Take 0.5-1 tablets (0.5-1 mg total) by mouth 2 (two) times daily as needed.   EPINEPHrine  0.3 mg/0.3 mL IJ SOAJ injection Inject 0.3 mg into the muscle as needed for anaphylaxis.   fexofenadine  (ALLEGRA ) 180 MG tablet Take 1 tablet (180 mg total) by mouth daily.   FLUoxetine  (PROZAC ) 10 MG capsule TAKE 1 CAPSULE DAILY WITH THE 20 MG=30 MG DAILY.   FLUoxetine  (PROZAC ) 20 MG capsule TAKE 1 CAPSULE EVERY DAY WITH 10MG  TO EQUAL 30MG    Magnesium 500 MG CAPS Take 1 capsule by mouth daily.   Rimegepant Sulfate (NURTEC) 75 MG TBDP Take 1 tablet (75 mg total) by mouth every other day.   rizatriptan  (MAXALT ) 10 MG tablet Take 1 tablet (10 mg total) by mouth as needed for migraine. May repeat in 2 hours if needed   rosuvastatin (CRESTOR) 20 MG tablet Take 20 mg by mouth daily. Take 1 tablet every other day   Topiramate  ER (TROKENDI  XR) 50 MG CP24 Take 1 capsule (50 mg total) by mouth at bedtime.   [DISCONTINUED] famotidine  (PEPCID ) 40 MG tablet Take 1 tablet (40 mg total) by mouth at bedtime.   [DISCONTINUED] fluticasone  (FLONASE ) 50 MCG/ACT nasal spray 1-2 sprays each nostril 1 (ONE) Time per day.   [DISCONTINUED] montelukast  (SINGULAIR ) 10 MG tablet Take 1 tablet (10 mg total) by mouth  at bedtime.   [DISCONTINUED] omeprazole  (PRILOSEC) 20 MG capsule Take 1 capsule (20 mg total) by mouth in the morning.     Allergies:    Cetirizine, Tranexamic acid, Dog epithelium (canis lupus familiaris), Ibuprofen, and Nsaids   Past Medical History: Past Medical History:  Diagnosis Date   Allergic rhinitis 06/21/2019   Asthma    Chronic migraine w/o aura w/o status migrainosus, not intractable 05/29/2022   COVID-19 virus detected 05/27/2019   04/18/2019-SARS-CoV-2-positive ([facility] rapid  test)  Did not require hospitalization, did not receive monoclonal antibody infusion     Eating disorder 03/29/2020   Endometriosis    Frequent headaches    Generalized anxiety disorder 03/29/2020   GERD (gastroesophageal reflux disease)    Mild persistent asthma without complication 10/22/2016   Onset in Childhood  Allergy  profile 10/22/2016 >  Eos 0.3/  IgE 73  RAST pos dust > dog> cat   Tree> grass, min mold   - Spirometry 10/22/2016  wnl though mild curvature on budesonide  neb  s saba prior  - 10/22/2016   try symb 160 2bid  - 05/29/2018  After extensive coaching inhaler device,  effectiveness =    90%           Mixed hyperlipidemia    Panic attacks    PMDD (premenstrual dysphoric disorder) 03/29/2020    Past Surgical History: Past Surgical History:  Procedure Laterality Date   LAPAROSCOPY     for endometriosis   WISDOM TOOTH EXTRACTION      Social History: Social History   Tobacco Use   Smoking status: Never    Passive exposure: Never   Smokeless tobacco: Never  Vaping Use   Vaping status: Never Used  Substance Use Topics   Alcohol use: Not Currently   Drug use: Never    Family History: Family History  Problem Relation Age of Onset   Heart disease Father    Diabetes Father    Hypertension Father    Breast cancer Sister    Alcohol abuse Sister    Pulmonary fibrosis Maternal Aunt    Asthma Maternal Aunt    Ovarian cancer Paternal Aunt    Depression Mother    CVA Mother    Prostate cancer Maternal Grandfather    Diabetes Maternal Grandfather    Anxiety disorder Maternal Grandmother    Depression Maternal Grandmother    Diabetes Paternal Grandmother    Hypertension Paternal Grandmother     ROS:   Review of Systems  Cardiovascular:  Negative for chest pain, claudication, irregular heartbeat, leg swelling, near-syncope, orthopnea, palpitations, paroxysmal nocturnal dyspnea and syncope.  Respiratory:  Negative for shortness of breath.   Hematologic/Lymphatic:  Negative for bleeding problem.    EKGs/Labs/Other Studies Reviewed:   Echocardiogram: Pending  CCTA 09/21/2023 1. Coronary calcium score of 0. This was 1st percentile for age, sex, and race matched control.  2. Normal coronary origin with right dominance.  3. CAD-RADS 0. No evidence of CAD (0%). Consider non-atherosclerotic causes of chest pain.  4. Radiology part: No acute extracardiac incidental findings.   Labs: External Labs: Collected: January 10, 2022 provided by PCP. Total cholesterol 197, triglycerides 286, HDL 43, LDL calculated 105, non-HDL 154  Collected: February 19, 2023 provided by PCP Total cholesterol 279, triglycerides 451, HDL 47, LDL calculated 148, non-HDL 232 BUN 11, creatinine 0.79. Sodium 139, potassium 4.8, chloride 104. AST, ALT, alkaline phosphatase within normal limits  Collected: April 28, 2023 provided by PCP. Total cholesterol 329, triglycerides 290, HDL  47, LDL calculated 222, non-HDL 282 A1c 5.8  External Labs: Collected: 07/28/2023 [facility] database. Total cholesterol 159, triglycerides 216, HDL 50, LDL 73. Hemoglobin A1c 5.8  Physical Exam:    Today's Vitals   09/29/23 1101  BP: 118/80  Pulse: 70  Resp: 16  SpO2: 97%  Weight: 230 lb 9.6 oz (104.6 kg)  Height: 5' 9 (1.753 m)    Body mass index is 34.05 kg/m. Wt Readings from Last 3 Encounters:  09/29/23 230 lb 9.6 oz (104.6 kg)  07/22/23 228 lb 6.4 oz (103.6 kg)  06/11/23 230 lb 9.6 oz (104.6 kg)    Physical Exam  Constitutional: No distress.  hemodynamically stable  Neck: No JVD present.  Cardiovascular: Normal rate, regular rhythm, S1 normal and S2 normal. Exam reveals no gallop, no S3 and no S4.  No murmur heard. Pulmonary/Chest: Effort normal and breath sounds normal. No stridor. She has no wheezes. She has no rales.  Musculoskeletal:        General: No edema.     Cervical back: Neck supple.  Skin: Skin is warm.     Impression & Recommendation(s):  Impression:    ICD-10-CM   1. Precordial pain  R07.2     2. Pure hypercholesterolemia  E78.00     3. Pure hypertriglyceridemia  E78.1     4. Family history of premature CAD  Z82.49     5. Class 1 obesity due to excess calories with serious comorbidity and body mass index (BMI) of 34.0 to 34.9 in adult  E66.811    E66.09    Z68.34         Recommendation(s):  Precordial pain Family history of premature CAD No reoccurrence of chest pain since last office visit. Echocardiogram: Pending. Coronary CTA: Total CAC 0, no evidence of epicardial coronary artery disease. Lipids have improved with initiation of Crestor 20 mg p.o. nightly. Would still recommend optimizing lipid profile with a goal LDL <70 and triglycerides <849 mg/dL Reemphasized importance of primary prevention. Discontinue Lopressor . No indication for aspirin 81 mg p.o. daily  Pure hypercholesterolemia LDL levels were as high as 222 back in February or 2025 Consistent with familial hypercholesterolemia. Started on Crestor 20 mg p.o. nightly and LDL levels are now 73 mg/dL. Would recommend a goal LDL <70 mg/dL, will focus on lifestyle modifications for now.  Pure hypertriglyceridemia Triglyceride levels have also improved from 290 mg/dL as of February 2025 down to 216 mg/dL. Instead of up titration of pharmacological therapy shared decision was to focus on lifestyle modifications and to follow-up clinically.  Would recommend a goal triglyceride level <150 mg/dL  Discussed management of at least 2 chronic comorbid conditions. Reviewed the results of the coronary CTA. Prescription drug management. Outside labs from 07/28/2023 independently reviewed Reemphasize importance of primary prevention.  Orders Placed:  No orders of the defined types were placed in this encounter.    Final Medication List:    No orders of the defined types were placed in this encounter.   Medications Discontinued During This Encounter  Medication Reason    metoprolol  tartrate (LOPRESSOR ) 25 MG tablet Completed Course      Current Outpatient Medications:    albuterol  (ACCUNEB ) 1.25 MG/3ML nebulizer solution, Take 3 mLs (1.25 mg total) by nebulization every 6 (six) hours as needed for wheezing., Disp: 100 mL, Rfl: 1   albuterol  (VENTOLIN  HFA) 108 (90 Base) MCG/ACT inhaler, INHALE 1 PUFF INTO THE LUNGS EVERY 6 HOURS AS NEEDED FOR WHEEZING OR SHORTNESS OF BREATH.,  Disp: 18 each, Rfl: 1   budesonide -formoterol  (SYMBICORT ) 160-4.5 MCG/ACT inhaler, Inhale 2 puffs into the lungs 2 (two) times daily., Disp: 10.2 g, Rfl: 5   buPROPion  (WELLBUTRIN  XL) 150 MG 24 hr tablet, Take 1 tablet (150 mg total) by mouth daily., Disp: 90 tablet, Rfl: 3   Cholecalciferol (VITAMIN D) 10 MCG/ML LIQD, Take 500 Units by mouth daily at 12 noon., Disp: , Rfl:    clonazePAM  (KLONOPIN ) 1 MG tablet, Take 0.5-1 tablets (0.5-1 mg total) by mouth 2 (two) times daily as needed., Disp: 30 tablet, Rfl: 0   EPINEPHrine  0.3 mg/0.3 mL IJ SOAJ injection, Inject 0.3 mg into the muscle as needed for anaphylaxis., Disp: 2 each, Rfl: 1   fexofenadine  (ALLEGRA ) 180 MG tablet, Take 1 tablet (180 mg total) by mouth daily., Disp: 90 tablet, Rfl: 1   FLUoxetine  (PROZAC ) 10 MG capsule, TAKE 1 CAPSULE DAILY WITH THE 20 MG=30 MG DAILY., Disp: 30 capsule, Rfl: 2   FLUoxetine  (PROZAC ) 20 MG capsule, TAKE 1 CAPSULE EVERY DAY WITH 10MG  TO EQUAL 30MG , Disp: 90 capsule, Rfl: 3   Magnesium 500 MG CAPS, Take 1 capsule by mouth daily., Disp: , Rfl:    Rimegepant Sulfate (NURTEC) 75 MG TBDP, Take 1 tablet (75 mg total) by mouth every other day., Disp: 16 tablet, Rfl: 11   rizatriptan  (MAXALT ) 10 MG tablet, Take 1 tablet (10 mg total) by mouth as needed for migraine. May repeat in 2 hours if needed, Disp: 10 tablet, Rfl: 6   rosuvastatin (CRESTOR) 20 MG tablet, Take 20 mg by mouth daily. Take 1 tablet every other day, Disp: , Rfl:    Topiramate  ER (TROKENDI  XR) 50 MG CP24, Take 1 capsule (50 mg total) by mouth  at bedtime., Disp: 30 capsule, Rfl: 11   famotidine  (PEPCID ) 40 MG tablet, TAKE 1 TABLET BY MOUTH EVERYDAY AT BEDTIME, Disp: 90 tablet, Rfl: 1   fluticasone  (FLONASE ) 50 MCG/ACT nasal spray, 1-2 SPRAYS EACH NOSTRIL 1 (ONE) TIME PER DAY., Disp: 48 mL, Rfl: 1   montelukast  (SINGULAIR ) 10 MG tablet, TAKE 1 TABLET BY MOUTH EVERYDAY AT BEDTIME, Disp: 90 tablet, Rfl: 1   omeprazole  (PRILOSEC) 20 MG capsule, TAKE 1 CAPSULE (20 MG TOTAL) BY MOUTH IN THE MORNING, Disp: 90 capsule, Rfl: 1  Consent:   NA  Disposition:   As needed basis  Her questions and concerns were addressed to her satisfaction. She voices understanding of the recommendations provided during this encounter.    Signed, Madonna Michele HAS, [facility]  A Division of [facility] [address], [place], [place] [address]  [place], [place] [address]  09/29/2023 1:54 PM

## 2023-09-29 NOTE — Patient Instructions (Signed)
 Medication Instructions:  STOP Metoprolol  Tartrate (Lopressor )  *If you need a refill on your cardiac medications before your next appointment, please call your pharmacy*  Lab Work: None ordered today. If you have labs (blood work) drawn today and your tests are completely normal, you will receive your results only by: MyChart Message (if you have MyChart) OR A paper copy in the mail If you have any lab test that is abnormal or we need to change your treatment, we will call you to review the results.  Testing/Procedures: None ordered today.  Follow-Up: At [facility], you and your health needs are our priority.  As part of our continuing mission to provide you with exceptional heart care, our providers are all part of one team.  This team includes your primary Cardiologist (physician) and Advanced Practice Providers or APPs (Physician Assistants and Nurse Practitioners) who all work together to provide you with the care you need, when you need it.  Your next appointment:   As needed  Provider:   Madonna Large, DO

## 2023-10-03 ENCOUNTER — Ambulatory Visit (INDEPENDENT_AMBULATORY_CARE_PROVIDER_SITE_OTHER)

## 2023-10-03 DIAGNOSIS — J309 Allergic rhinitis, unspecified: Secondary | ICD-10-CM | POA: Diagnosis not present

## 2023-10-09 ENCOUNTER — Ambulatory Visit (HOSPITAL_COMMUNITY)
Admission: RE | Admit: 2023-10-09 | Discharge: 2023-10-09 | Disposition: A | Discharge status: Home / Self Care | Source: Ambulatory Visit | Attending: Cardiology | Admitting: Cardiology

## 2023-10-09 DIAGNOSIS — R072 Precordial pain: Secondary | ICD-10-CM

## 2023-10-09 LAB — ECHOCARDIOGRAM COMPLETE
Area-P 1/2: 2.56 cm2
S' Lateral: 3.1 cm

## 2023-10-19 ENCOUNTER — Ambulatory Visit: Payer: Self-pay | Admitting: Cardiology

## 2023-10-24 ENCOUNTER — Ambulatory Visit (INDEPENDENT_AMBULATORY_CARE_PROVIDER_SITE_OTHER)

## 2023-10-24 DIAGNOSIS — J309 Allergic rhinitis, unspecified: Secondary | ICD-10-CM | POA: Diagnosis not present

## 2023-11-11 ENCOUNTER — Other Ambulatory Visit: Payer: Self-pay

## 2023-11-11 ENCOUNTER — Ambulatory Visit (INDEPENDENT_AMBULATORY_CARE_PROVIDER_SITE_OTHER): Payer: BC Managed Care – PPO | Admitting: Allergy and Immunology

## 2023-11-11 ENCOUNTER — Encounter: Payer: Self-pay | Admitting: Allergy and Immunology

## 2023-11-11 VITALS — BP 118/78 | HR 72 | Temp 97.7°F | Resp 20 | Ht 70.0 in | Wt 230.9 lb

## 2023-11-11 DIAGNOSIS — J309 Allergic rhinitis, unspecified: Secondary | ICD-10-CM

## 2023-11-11 DIAGNOSIS — T781XXD Other adverse food reactions, not elsewhere classified, subsequent encounter: Secondary | ICD-10-CM

## 2023-11-11 DIAGNOSIS — J4540 Moderate persistent asthma, uncomplicated: Secondary | ICD-10-CM

## 2023-11-11 DIAGNOSIS — J3089 Other allergic rhinitis: Secondary | ICD-10-CM

## 2023-11-11 DIAGNOSIS — J301 Allergic rhinitis due to pollen: Secondary | ICD-10-CM | POA: Diagnosis not present

## 2023-11-11 DIAGNOSIS — K219 Gastro-esophageal reflux disease without esophagitis: Secondary | ICD-10-CM

## 2023-11-11 MED ORDER — EPINEPHRINE 0.3 MG/0.3ML IJ SOAJ: 0.3000 mg | INTRAMUSCULAR | 1 refills | Status: AC | PRN

## 2023-11-11 MED ORDER — BUDESONIDE-FORMOTEROL FUMARATE 160-4.5 MCG/ACT IN AERO: 2.0000 | INHALATION_SPRAY | Freq: Two times a day (BID) | RESPIRATORY_TRACT | 1 refills | Status: AC

## 2023-11-11 MED ORDER — NEBULIZER MASK ADULT MISC: 1.0000 | 1 refills | Status: AC

## 2023-11-11 MED ORDER — FEXOFENADINE HCL 180 MG PO TABS: 180.0000 mg | ORAL_TABLET | Freq: Every day | ORAL | 1 refills | Status: AC | PRN

## 2023-11-11 MED ORDER — FLUTICASONE PROPIONATE 50 MCG/ACT NA SUSP: 2.0000 | Freq: Every morning | NASAL | 1 refills | Status: AC

## 2023-11-11 MED ORDER — FAMOTIDINE 40 MG PO TABS: 40.0000 mg | ORAL_TABLET | Freq: Every evening | ORAL | 1 refills | Status: AC

## 2023-11-11 MED ORDER — ALBUTEROL SULFATE 1.25 MG/3ML IN NEBU: 1.0000 | INHALATION_SOLUTION | Freq: Four times a day (QID) | RESPIRATORY_TRACT | 1 refills | Status: AC | PRN

## 2023-11-11 MED ORDER — MONTELUKAST SODIUM 10 MG PO TABS: 10.0000 mg | ORAL_TABLET | Freq: Every evening | ORAL | 1 refills | Status: AC

## 2023-11-11 MED ORDER — OMEPRAZOLE 20 MG PO CPDR
20.0000 mg | DELAYED_RELEASE_CAPSULE | Freq: Every morning | ORAL | 1 refills | Status: AC
Start: 2023-11-11 — End: ?

## 2023-11-11 NOTE — Progress Notes (Unsigned)
 [place] - [place] - [place] - [place] - [place]   Follow-up Note  Referring Provider: Katina Pfeiffer, PA-C Primary Provider: Katina Pfeiffer, PA-C Date of Office Visit: 11/11/2023  Subjective:   Sherry Adams (DOB: 05/23/1970) is a 52 y.o. female who [facility] in re-evaluation of the following:  HPI: Sherry Adams returns to this clinic in evaluation of asthma, allergic rhinoconjunctivitis, oral allergy  syndrome, LPR.  I last saw her in this clinic 13 May 2023.  She is doing very well with her airway and she has not required a systemic steroid or an antibiotic for any type of airway issue and she rarely uses a short acting bronchodilator averaging out about 1 time per week.  She uses her short acting bronchodilator when she gets some chest tightness or congestion.  She develops this degree of control while using immunotherapy currently at every 2 weeks without any adverse effect and Symbicort  mostly once a day as well as Flonase  and montelukast .  She can now eat raw fruits and vegetables with no problem.  And her reflux is under very good control with using a combination of omeprazole  and famotidine .  Allergies as of 11/11/2023       Reactions   Cetirizine Photosensitivity, Other (See Comments)   Tranexamic Acid Cough   tranexamic acid   Dog Epithelium (canis Lupus Familiaris) Itching   Asthma?   Ibuprofen Other (See Comments)   bronchospams   Nsaids Nausea Only        Medication List    albuterol  1.25 MG/3ML nebulizer solution Commonly known as: ACCUNEB  Take 3 mLs (1.25 mg total) by nebulization every 6 (six) hours as needed for wheezing.   albuterol  108 (90 Base) MCG/ACT inhaler Commonly known as: VENTOLIN  HFA INHALE 1 PUFF INTO THE LUNGS EVERY 6 HOURS AS NEEDED FOR WHEEZING OR SHORTNESS OF BREATH.   budesonide -formoterol  160-4.5 MCG/ACT inhaler Commonly known as: Symbicort  Inhale 2 puffs into the lungs  2 (two) times daily.   buPROPion  150 MG 24 hr tablet Commonly known as: WELLBUTRIN  XL Take 1 tablet (150 mg total) by mouth daily.   clonazePAM  1 MG tablet Commonly known as: KLONOPIN  Take 0.5-1 tablets (0.5-1 mg total) by mouth 2 (two) times daily as needed.   EPINEPHrine  0.3 mg/0.3 mL Soaj injection Commonly known as: EPI-PEN Inject 0.3 mg into the muscle as needed for anaphylaxis.   famotidine  40 MG tablet Commonly known as: PEPCID  TAKE 1 TABLET BY MOUTH EVERYDAY AT BEDTIME   fexofenadine  180 MG tablet Commonly known as: ALLEGRA  Take 1 tablet (180 mg total) by mouth daily.   FLUoxetine  20 MG capsule Commonly known as: PROZAC  TAKE 1 CAPSULE EVERY DAY WITH 10MG  TO EQUAL 30MG    FLUoxetine  10 MG capsule Commonly known as: PROZAC  TAKE 1 CAPSULE DAILY WITH THE 20 MG=30 MG DAILY.   fluticasone  50 MCG/ACT nasal spray Commonly known as: FLONASE  1-2 SPRAYS EACH NOSTRIL 1 (ONE) TIME PER DAY.   Magnesium 500 MG Caps Take 1 capsule by mouth daily.   montelukast  10 MG tablet Commonly known as: SINGULAIR  TAKE 1 TABLET BY MOUTH EVERYDAY AT BEDTIME   Nurtec 75 MG Tbdp Generic drug: Rimegepant Sulfate Take 1 tablet (75 mg total) by mouth every other day.   omeprazole  20 MG capsule Commonly known as: PRILOSEC TAKE 1 CAPSULE (20 MG TOTAL) BY MOUTH IN THE MORNING   rizatriptan  10 MG tablet Commonly known as: Maxalt  Take 1 tablet (10 mg total) by mouth as  needed for migraine. May repeat in 2 hours if needed   rosuvastatin 20 MG tablet Commonly known as: CRESTOR Take 20 mg by mouth daily. Take 1 tablet every other day   Topiramate  ER 50 MG Cp24 Commonly known as: TROKENDI  XR Take 1 capsule (50 mg total) by mouth at bedtime.   Vitamin D 10 MCG/ML Liqd Take 500 Units by mouth daily at 12 noon.    Past Medical History:  Diagnosis Date   Allergic rhinitis 06/21/2019   Asthma    Chronic migraine w/o aura w/o status migrainosus, not intractable 05/29/2022   COVID-19 virus  detected 05/27/2019   04/18/2019-SARS-CoV-2-positive ([facility] test)  Did not require hospitalization, did not receive monoclonal antibody infusion     Eating disorder 03/29/2020   Endometriosis    Frequent headaches    Generalized anxiety disorder 03/29/2020   GERD (gastroesophageal reflux disease)    Mild persistent asthma without complication 10/22/2016   Onset in Childhood  Allergy  profile 10/22/2016 >  Eos 0.3/  IgE 73  RAST pos dust > dog> cat   Tree> grass, min mold   - Spirometry 10/22/2016  wnl though mild curvature on budesonide  neb  s saba prior  - 10/22/2016   try symb 160 2bid  - 05/29/2018  After extensive coaching inhaler device,  effectiveness =    90%           Mixed hyperlipidemia    Panic attacks    PMDD (premenstrual dysphoric disorder) 03/29/2020    Past Surgical History:  Procedure Laterality Date   LAPAROSCOPY     for endometriosis   WISDOM TOOTH EXTRACTION      Review of systems negative except as noted in HPI / PMHx or noted below:  Review of Systems  Constitutional: Negative.   HENT: Negative.    Eyes: Negative.   Respiratory: Negative.    Cardiovascular: Negative.   Gastrointestinal: Negative.   Genitourinary: Negative.   Musculoskeletal: Negative.   Skin: Negative.   Neurological: Negative.   Endo/Heme/Allergies: Negative.   Psychiatric/Behavioral: Negative.       Objective:   Vitals:   11/11/23 1139  BP: 118/78  Pulse: 72  Resp: 20  Temp: 97.7 F (36.5 C)  SpO2: 96%   Height: 5' 10 (177.8 cm)  Weight: 230 lb 14.4 oz (104.7 kg)   Physical Exam Constitutional:      Appearance: She is not diaphoretic.  HENT:     Head: Normocephalic.     Right Ear: Tympanic membrane, ear canal and external ear normal.     Left Ear: Tympanic membrane, ear canal and external ear normal.     Nose: Nose normal. No mucosal edema or rhinorrhea.     Mouth/Throat:     Pharynx: Uvula midline. No oropharyngeal exudate.  Eyes:     Conjunctiva/sclera:  Conjunctivae normal.  Neck:     Thyroid: No thyromegaly.     Trachea: Trachea normal. No tracheal tenderness or tracheal deviation.  Cardiovascular:     Rate and Rhythm: Normal rate and regular rhythm.     Heart sounds: Normal heart sounds, S1 normal and S2 normal. No murmur heard. Pulmonary:     Effort: No respiratory distress.     Breath sounds: Normal breath sounds. No stridor. No wheezing or rales.  Lymphadenopathy:     Head:     Right side of head: No tonsillar adenopathy.     Left side of head: No tonsillar adenopathy.     Cervical: No  cervical adenopathy.  Skin:    Findings: No erythema or rash.     Nails: There is no clubbing.  Neurological:     Mental Status: She is alert.     Diagnostics: Spirometry was performed and demonstrated an FEV1 of 2.81 at 87 % of predicted.  Assessment and Plan:   1. Asthma, moderate persistent, well-controlled   2. Perennial allergic rhinitis   3. Seasonal allergic rhinitis due to pollen   4. Pollen-food allergy , subsequent encounter   5. LPRD (laryngopharyngeal reflux disease)   6. Allergic rhinitis, unspecified seasonality, unspecified trigger    1.  Allergen avoidance measures -dust mite, cat, dog, pollens, cockroach    2.  Treat and prevent inflammation:  A. Symbicort  160 - 2 inhalations 1-2 times per day   B. Flonase  - 1-2 sprays each nostril 1 time per day C. Montelukast  10 mg - 1 tablet 1 time per day D. Immunotherapy   3. Treat LPR:  A. Omeprazole  40 mg - 1 tab in AM B. Famotidine  40 mg - in PM IF NEEDED  4. If needed:  A. Fexofenadine  180 - 1 tablet 1 time per day B. SYMBICORT  160 - 2 inhalations every 6 hours C. Epi-Pen, benadryl, MD/ER evaluation for allergic reaction  5. Return to clinic in 6 months or earlier if problem  6. Influenza = Tamiflu. Covid = Paxlovid  Sherry Adams is doing very well on her current plan of utilizing anti-inflammatory agents for her airway including immunotherapy and also addressing her  issue with LPR.  I think there is an opportunity to consolidate some of her medical treatment and we will see if she can discontinue her famotidine  at this point.  I will see her back in this clinic in 6 months or earlier if there is a problem.  Sherry Denis, MD Allergy  / Immunology [facility]

## 2023-11-11 NOTE — Patient Instructions (Addendum)
  1.  Allergen avoidance measures -dust mite, cat, dog, pollens, cockroach    2.  Treat and prevent inflammation:  A. Symbicort  160 - 2 inhalations 1-2 times per day   B. Flonase  - 1-2 sprays each nostril 1 time per day C. Montelukast  10 mg - 1 tablet 1 time per day D. Immunotherapy   3. Treat LPR:  A. Omeprazole  40 mg - 1 tab in AM B. Famotidine  40 mg - in PM IF NEEDED  4. If needed:  A. Fexofenadine  180 - 1 tablet 1 time per day B. SYMBICORT  160 - 2 inhalations every 6 hours C. Epi-Pen, benadryl, MD/ER evaluation for allergic reaction  5. Return to clinic in 6 months or earlier if problem  6. Influenza = Tamiflu. Covid = Paxlovid

## 2023-11-12 ENCOUNTER — Encounter: Payer: Self-pay | Admitting: Allergy and Immunology

## 2023-11-21 ENCOUNTER — Ambulatory Visit (INDEPENDENT_AMBULATORY_CARE_PROVIDER_SITE_OTHER)

## 2023-11-21 DIAGNOSIS — J4540 Moderate persistent asthma, uncomplicated: Secondary | ICD-10-CM

## 2023-12-03 ENCOUNTER — Ambulatory Visit (INDEPENDENT_AMBULATORY_CARE_PROVIDER_SITE_OTHER)

## 2023-12-03 DIAGNOSIS — J301 Allergic rhinitis due to pollen: Secondary | ICD-10-CM

## 2023-12-18 ENCOUNTER — Ambulatory Visit (INDEPENDENT_AMBULATORY_CARE_PROVIDER_SITE_OTHER)

## 2023-12-18 DIAGNOSIS — J309 Allergic rhinitis, unspecified: Secondary | ICD-10-CM

## 2023-12-31 ENCOUNTER — Ambulatory Visit (INDEPENDENT_AMBULATORY_CARE_PROVIDER_SITE_OTHER)

## 2023-12-31 DIAGNOSIS — J309 Allergic rhinitis, unspecified: Secondary | ICD-10-CM

## 2024-01-19 ENCOUNTER — Telehealth: Payer: Self-pay | Admitting: Neurology

## 2024-01-19 NOTE — Telephone Encounter (Signed)
 Request to reschedule appointment

## 2024-01-20 ENCOUNTER — Ambulatory Visit: Admitting: Neurology
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
## 2024-01-22 ENCOUNTER — Encounter: Payer: Self-pay | Admitting: Neurology

## 2024-01-22 ENCOUNTER — Ambulatory Visit: Admitting: Neurology

## 2024-01-22 VITALS — BP 118/70 | HR 60 | Ht 70.0 in

## 2024-01-22 DIAGNOSIS — G43709 Chronic migraine without aura, not intractable, without status migrainosus: Secondary | ICD-10-CM | POA: Diagnosis not present

## 2024-01-22 MED ORDER — TOPIRAMATE ER 25 MG PO CAP24: 25.0000 mg | ORAL_CAPSULE | Freq: Every day | ORAL | 0 refills | Status: DC

## 2024-01-22 MED ORDER — NURTEC 75 MG PO TBDP: 75.0000 mg | ORAL_TABLET | ORAL | 11 refills | Status: AC

## 2024-01-22 NOTE — Patient Instructions (Signed)
 Start Nurtec 75 mg tablet every other day for migraine prevention, continue topamax  50 mg daily for 2 weeks, then reduce to 25 mg for 2 weeks, then stop it. Continue the Nurtec every other day for migraine prevention

## 2024-01-28 ENCOUNTER — Ambulatory Visit (INDEPENDENT_AMBULATORY_CARE_PROVIDER_SITE_OTHER)

## 2024-01-28 DIAGNOSIS — J309 Allergic rhinitis, unspecified: Secondary | ICD-10-CM

## 2024-02-03 ENCOUNTER — Other Ambulatory Visit: Payer: Self-pay | Admitting: Neurology

## 2024-03-12 ENCOUNTER — Ambulatory Visit (INDEPENDENT_AMBULATORY_CARE_PROVIDER_SITE_OTHER)

## 2024-03-12 DIAGNOSIS — J309 Allergic rhinitis, unspecified: Secondary | ICD-10-CM | POA: Diagnosis not present

## 2024-03-19 ENCOUNTER — Other Ambulatory Visit: Payer: Self-pay | Admitting: Neurology

## 2024-03-19 ENCOUNTER — Other Ambulatory Visit: Payer: Self-pay | Admitting: Physician Assistant

## 2024-03-22 NOTE — Telephone Encounter (Signed)
 Last OV 01/22/24 Next OV 01/20/25  Last refill 12/04/22 Qty #10/6

## 2024-04-11 ENCOUNTER — Other Ambulatory Visit: Payer: Self-pay | Admitting: Physician Assistant

## 2024-04-16 ENCOUNTER — Ambulatory Visit (INDEPENDENT_AMBULATORY_CARE_PROVIDER_SITE_OTHER): Admitting: *Deleted

## 2024-04-16 DIAGNOSIS — J302 Other seasonal allergic rhinitis: Secondary | ICD-10-CM

## 2024-04-18 ENCOUNTER — Other Ambulatory Visit: Payer: Self-pay | Admitting: Physician Assistant

## 2024-04-21 ENCOUNTER — Encounter: Payer: Self-pay | Admitting: Physician Assistant

## 2024-04-21 ENCOUNTER — Ambulatory Visit: Admitting: Physician Assistant

## 2024-04-21 DIAGNOSIS — F3341 Major depressive disorder, recurrent, in partial remission: Secondary | ICD-10-CM

## 2024-04-21 DIAGNOSIS — R630 Anorexia: Secondary | ICD-10-CM

## 2024-04-21 DIAGNOSIS — F411 Generalized anxiety disorder: Secondary | ICD-10-CM

## 2024-04-21 DIAGNOSIS — F4310 Post-traumatic stress disorder, unspecified: Secondary | ICD-10-CM | POA: Diagnosis not present

## 2024-04-21 MED ORDER — FLUOXETINE HCL 10 MG PO CAPS: ORAL_CAPSULE | ORAL | 1 refills | Status: AC

## 2024-04-21 MED ORDER — BUPROPION HCL 75 MG PO TABS: 75.0000 mg | ORAL_TABLET | Freq: Every day | ORAL | 1 refills | Status: AC

## 2024-04-21 MED ORDER — FLUOXETINE HCL 20 MG PO CAPS: ORAL_CAPSULE | ORAL | 1 refills | Status: AC

## 2024-04-21 MED ORDER — CLONAZEPAM 1 MG PO TABS: 0.5000 mg | ORAL_TABLET | Freq: Two times a day (BID) | ORAL | 0 refills | Status: AC | PRN

## 2024-04-21 NOTE — Progress Notes (Signed)
 "     [facility]  Patient ID: Sherry Adams,  MRN: [id number]  PCP: Katina Pfeiffer, PA-C  Date of Evaluation: 04/21/2024 Time spent:20 minutes  Chief Complaint:  Chief Complaint   Anxiety; Depression; Follow-up    HISTORY/CURRENT STATUS: HPI  8 months overdue for f/u appt  She decreased the Wellbutrin  a few months ago.  She had some old 75 mg so she started taking it. It hasn't affected her mood negatively at all.  The Wellbutrin  has caused dec appetite so much that she's had to force herself to eat. Lowering the dose has helped that some.   Patient is able to enjoy things.  Energy and motivation are good.  Work is going well.   No extreme sadness, tearfulness, or feelings of hopelessness.  Sleeps well most of the time. ADLs and personal hygiene are normal.   Denies any changes in concentration, making decisions, or remembering things.  Has anxiety bad enough to need the Klonopin  on rare occas.  It is effective when needed.  She hasn't had a period in months so need for the increased Prozac  for PMDD. No mania, delirium, AH/VH.  No SI/HI.  Individual Medical History/ Review of Systems: Changes? :No     Past medications for mental health diagnoses include: Zoloft, Effexor, Prozac , Klonopin , Wellbutrin  worked well for a while but caused palpitations.  Allergies: Cetirizine, Tranexamic acid, Dog epithelium (canis lupus familiaris), Ibuprofen, and Nsaids  Current Medications:  Current Outpatient Medications:    albuterol  (ACCUNEB ) 1.25 MG/3ML nebulizer solution, Take 3 mLs (1.25 mg total) by nebulization every 6 (six) hours as needed for wheezing., Disp: 100 mL, Rfl: 1   budesonide -formoterol  (SYMBICORT ) 160-4.5 MCG/ACT inhaler, Inhale 2 puffs into the lungs 2 (two) times daily., Disp: 30.6 g, Rfl: 1   Cholecalciferol (VITAMIN D) 10 MCG/ML LIQD, Take 500 Units by mouth daily at 12 noon., Disp: , Rfl:    EPINEPHrine  0.3 mg/0.3 mL IJ SOAJ injection, Inject 0.3 mg into the muscle  as needed for anaphylaxis., Disp: 2 each, Rfl: 1   famotidine  (PEPCID ) 40 MG tablet, Take 1 tablet (40 mg total) by mouth at bedtime. May use if needed, Disp: 90 tablet, Rfl: 1   fexofenadine  (ALLEGRA ) 180 MG tablet, Take 1 tablet (180 mg total) by mouth daily as needed for allergies or rhinitis (Can take an etxra dose during flare ups.)., Disp: 180 tablet, Rfl: 1   fluticasone  (FLONASE ) 50 MCG/ACT nasal spray, Place 2 sprays into both nostrils every morning. 1-2 sprays each nostril 1 (ONE) Time per day., Disp: 48 g, Rfl: 1   Magnesium 500 MG CAPS, Take 1 capsule by mouth daily., Disp: , Rfl:    montelukast  (SINGULAIR ) 10 MG tablet, Take 1 tablet (10 mg total) by mouth at bedtime., Disp: 90 tablet, Rfl: 1   omeprazole  (PRILOSEC) 20 MG capsule, Take 1 capsule (20 mg total) by mouth in the morning., Disp: 90 capsule, Rfl: 1   Respiratory Therapy Supplies (NEBULIZER MASK ADULT) MISC, 1 Device by Does not apply route as directed., Disp: 1 each, Rfl: 1   Rimegepant Sulfate (NURTEC) 75 MG TBDP, Take 1 tablet (75 mg total) by mouth every other day., Disp: 16 tablet, Rfl: 11   rosuvastatin (CRESTOR) 20 MG tablet, Take 20 mg by mouth daily. Take 1 tablet every other day, Disp: , Rfl:    Topiramate  ER (TROKENDI  XR) 25 MG CP24, TAKE 1 CAPSULE BY MOUTH AT BEDTIME., Disp: 90 capsule, Rfl: 3   buPROPion  (WELLBUTRIN ) 75  MG tablet, Take 1 tablet (75 mg total) by mouth daily., Disp: 90 tablet, Rfl: 1   clonazePAM  (KLONOPIN ) 1 MG tablet, Take 0.5-1 tablets (0.5-1 mg total) by mouth 2 (two) times daily as needed., Disp: 30 tablet, Rfl: 0   FLUoxetine  (PROZAC ) 10 MG capsule, TAKE 1 CAPSULE DAILY WITH THE 20 MG=30 MG DAILY., Disp: 90 capsule, Rfl: 1   FLUoxetine  (PROZAC ) 20 MG capsule, TAKE 1 CAPSULE EVERY DAY WITH 10MG  TO EQUAL 30MG , Disp: 90 capsule, Rfl: 1   rizatriptan  (MAXALT ) 10 MG tablet, TAKE 1 TABLET BY MOUTH AS NEEDED FOR MIGRAINE. MAY REPEAT IN 2 HOURS IF NEEDED, Disp: 10 tablet, Rfl: 6 Medication Side  Effects: none  Family Medical/ Social History: Changes? none  MENTAL HEALTH EXAM:  There were no vitals taken for this visit.There is no height or weight on file to calculate BMI.  General Appearance: Casual, Neat and Well Groomed  Eye Contact:  Good  Speech:  Clear and Coherent and Normal Rate  Volume:  Normal  Mood:  Euthymic  Affect:  Congruent  Thought Process:  Goal Directed and Descriptions of Associations: Circumstantial  Orientation:  Full (Time, Place, and Person)  Thought Content: Logical   Suicidal Thoughts:  No  Homicidal Thoughts:  No  Memory:  WNL  Judgement:  Good  Insight:  Good  Psychomotor Activity:  Normal  Concentration:  Concentration: Good and Attention Span: Good  Recall:  Good  Fund of Knowledge: Good  Language: Good  Assets:  Communication Skills Desire for Improvement Financial Resources/Insurance Housing Leisure Time Physical Health Resilience Transportation Vocational/Educational  ADL's:  Intact  Cognition: WNL  Prognosis:  Good   DIAGNOSES:    ICD-10-CM   1. Recurrent major depressive disorder, in partial remission  F33.41     2. PTSD (post-traumatic stress disorder)  F43.10     3. Generalized anxiety disorder  F41.1     4. Decreased appetite  R63.0       Receiving Psychotherapy: Yes    April  Fjeld  RECOMMENDATIONS:  PDMP reviewed.  Klonopin  filled 04/02/2023. I provided approximately 20 minutes of face to face time during this encounter, including time spent before and after the visit in records review, medical decision making, counseling pertinent to today's visit, and charting.   [name] is doing well.  If she chooses to decrease the Wellbutrin  even further, she will take 1/2 pill for a few weeks and then stop.  I don't recommend that until spring though. And it's fine to stay on 75 mg.   Continue Wellbutrin  75 mg/daily Continue Klonopin  to 1 mg 1/2-1 twice daily as needed. Continue Prozac  30 mg every morning with then  increased to 40 mg daily 1 week prior to menses and then go back to 30 mg after her menses begins.  Continue Topamax  50 mg, nightly per neurology. Continue therapy with April Fjeld. Return in 3 months.   Verneita Cooks, PA-C  "

## 2024-04-21 NOTE — Progress Notes (Signed)
 VIALS MADE ON 04/21/24

## 2024-05-11 ENCOUNTER — Ambulatory Visit: Admitting: Family

## 2024-07-19 ENCOUNTER — Ambulatory Visit: Admitting: Physician Assistant

## 2025-01-20 ENCOUNTER — Ambulatory Visit: Admitting: Neurology
